# Patient Record
Sex: Female | Born: 1940
Health system: Southern US, Community
[De-identification: ages and names within clinical notes are randomized; demographics above are authoritative.]

## PROBLEM LIST (undated history)

## (undated) DIAGNOSIS — I10 Essential (primary) hypertension: Secondary | ICD-10-CM

## (undated) DIAGNOSIS — Z8719 Personal history of other diseases of the digestive system: Secondary | ICD-10-CM

## (undated) DIAGNOSIS — T7840XA Allergy, unspecified, initial encounter: Secondary | ICD-10-CM

## (undated) DIAGNOSIS — C801 Malignant (primary) neoplasm, unspecified: Secondary | ICD-10-CM

## (undated) DIAGNOSIS — M1990 Unspecified osteoarthritis, unspecified site: Secondary | ICD-10-CM

## (undated) DIAGNOSIS — R42 Dizziness and giddiness: Secondary | ICD-10-CM

## (undated) HISTORY — DX: Unspecified osteoarthritis, unspecified site: M19.90

## (undated) HISTORY — DX: Dizziness and giddiness: R42

## (undated) HISTORY — DX: Malignant (primary) neoplasm, unspecified: C80.1

## (undated) HISTORY — DX: Allergy, unspecified, initial encounter: T78.40XA

## (undated) HISTORY — PX: CHOLECYSTECTOMY: SHX55

## (undated) HISTORY — PX: EYE SURGERY: SHX253

## (undated) HISTORY — PX: BREAST LUMPECTOMY: SHX2

---

## 1999-11-22 ENCOUNTER — Encounter: Admission: RE | Admit: 1999-11-22 | Discharge: 2000-02-20 | Payer: Self-pay | Admitting: Radiation Oncology

## 2000-11-26 DIAGNOSIS — Z923 Personal history of irradiation: Secondary | ICD-10-CM

## 2000-11-26 HISTORY — DX: Personal history of irradiation: Z92.3

## 2008-05-11 ENCOUNTER — Ambulatory Visit: Payer: Self-pay | Admitting: Cardiology

## 2013-07-15 ENCOUNTER — Telehealth (INDEPENDENT_AMBULATORY_CARE_PROVIDER_SITE_OTHER): Payer: Self-pay

## 2013-07-15 NOTE — Telephone Encounter (Signed)
Called Misty Wilkerson to see if she would be bringing her mgm films to her appt with Dr Dwain Sarna on 8/26 and the Misty Wilkerson will take care of that for [place].

## 2013-07-21 ENCOUNTER — Ambulatory Visit (INDEPENDENT_AMBULATORY_CARE_PROVIDER_SITE_OTHER): Payer: Medicare Other | Admitting: General Surgery

## 2013-07-21 ENCOUNTER — Encounter (INDEPENDENT_AMBULATORY_CARE_PROVIDER_SITE_OTHER): Payer: Self-pay | Admitting: General Surgery

## 2013-07-21 VITALS — BP 110/70 | HR 64 | Resp 16 | Ht 62.0 in | Wt 158.8 lb

## 2013-07-21 DIAGNOSIS — C50219 Malignant neoplasm of upper-inner quadrant of unspecified female breast: Secondary | ICD-10-CM

## 2013-07-21 DIAGNOSIS — C50212 Malignant neoplasm of upper-inner quadrant of left female breast: Secondary | ICD-10-CM

## 2013-07-22 NOTE — Progress Notes (Signed)
Patient ID: Misty Wilkerson, female   DOB: 03/01/1941, 71 y.o.   MRN: [id number]  Chief Complaint  Patient presents with  . New Evaluation    new br ca    HPI Misty Wilkerson is a 71 y.o. female. Referred by Dr Tapper/ Dr Murray HPI 71 yof who underwent prior left breast lumpectomy/snbx folowed by xrt for what sounds like T1bN0M0 er/pr pos her2 negative tumor in 2000.  She tried both tamoxifen and arimidex but stopped both of these due to "anger issues".  This significantly degraded her quality of life so she did not complete them.  She has no complaints referable to either breast.  She recently underwent routine screening mm that showed a small irregular mass in the upper portion of the left breast posteriorly.  US showed an irregular round mass in 1030 position measuring 5 mm.  No abnormal nodes were noted in the axilla.  This underwent us guided biopsy with clip placement  The biopsy shows idc with dcis, er/pr positive and her2 not amplified.  Appears Ki is 11%.  This is grade I tumor.  She comes in today to discuss options.  Dr Murray, her radiation oncologist as well as her husbands, has discussed patient with me.  Past Medical History  Diagnosis Date  . Arthritis   . Cancer     Past Surgical History  Procedure Laterality Date  . Breast lumpectomy    . Cholecystectomy      History reviewed. No pertinent family history.  Social History History  Substance Use Topics  . Smoking status: Former Smoker    Quit date: 07/22/1999  . Smokeless tobacco: Never Used  . Alcohol Use: Yes    Allergies  Allergen Reactions  . Penicillins Shortness Of Breath and Rash    Current Outpatient Prescriptions  Medication Sig Dispense Refill  . loratadine (CLARITIN) 10 MG tablet Take 10 mg by mouth daily.      . meloxicam (MOBIC) 15 MG tablet       . metoprolol succinate (TOPROL-XL) 50 MG 24 hr tablet       . vitamin B-12 (CYANOCOBALAMIN) 1000 MCG tablet Take 1,000 mcg by mouth daily.         No current facility-administered medications for this visit.    Review of Systems Review of Systems  Constitutional: Negative for fever, chills and unexpected weight change.  HENT: Negative for hearing loss, congestion, sore throat, trouble swallowing and voice change.   Eyes: Negative for visual disturbance.  Respiratory: Negative for cough and wheezing.   Cardiovascular: Negative for chest pain, palpitations and leg swelling.  Gastrointestinal: Negative for nausea, vomiting, abdominal pain, diarrhea, constipation, blood in stool, abdominal distention and anal bleeding.  Genitourinary: Negative for hematuria, vaginal bleeding and difficulty urinating.  Musculoskeletal: Positive for arthralgias.  Skin: Negative for rash and wound.  Neurological: Negative for seizures, syncope and headaches.  Hematological: Negative for adenopathy. Bruises/bleeds easily.  Psychiatric/Behavioral: Negative for confusion.    Blood pressure 110/70, pulse 64, resp. rate 16, height 5' 2" (1.575 m), weight 158 lb 12.8 oz (72.031 kg).  Physical Exam Physical Exam  Vitals reviewed. Constitutional: She appears well-developed and well-nourished.  Eyes: No scleral icterus.  Neck: Neck supple.  Cardiovascular: Normal rate, regular rhythm and normal heart sounds.   Pulmonary/Chest: Effort normal and breath sounds normal. She has no wheezes. She has no rales. Right breast exhibits no inverted nipple, no mass, no nipple discharge, no skin change and no tenderness. Left breast exhibits no   inverted nipple, no mass, no nipple discharge, no skin change and no tenderness.    Abdominal: Soft. There is no hepatomegaly.  Lymphadenopathy:    She has no cervical adenopathy.    Data Reviewed Mm, path and old notes reviewed  Assessment   Left breast cancer (appears to be new site and not in lumpectomy bed), s/p bct/xrt previously    Plan    Left breast wire guided lumpectomy, attempt repeat left axillary sentinel  node biopsy     We discussed standard recommendation per the NCCN guidelines to be mastectomy given prior bct with xrt.  She does not really want to undergo mastectomy.  We discussed a lumpectomy but that she would not be able to receive xrt.  The tumor is biologically favorable and I think if able to do lumpectomy with negative margin followed by antiestrogen therapy (we discussed this at length given her prior issues) then this might be reasonable.  She very much is in favor of this.  We discussed breast mr and then decided to forgo it. I will also attempt a repeat snbx at time of surgery but she knows this technically might not be possible. We discussed the staging and pathophysiology of breast cancer. We discussed all of the different options for treatment for breast cancer including surgery, chemotherapy, radiation therapy, Herceptin, and antiestrogen therapy.   We discussed a sentinel lymph node biopsy as she does not appear to having lymph node involvement right now. We discussed the performance of that with injection of radioactive tracer and blue dye. We discussed that she would have an incision underneath her axillary hairline where prior one is. We discussed that there is a bout a 10% chance of having a positive node with a sentinel lymph node biopsy and we will await the permanent pathology to make any other first further decisions in terms of her treatment. One of these options might be to return to the operating room to perform an axillary lymph node dissection. We discussed about a 1-2% risk lifetime of chronic shoulder pain as well as lymphedema associated with a sentinel lymph node biopsy. This is likely a little higher given prior surgery  We discussed the performance of the lumpectomy with a wire placement. We discussed a 10% chance of a positive margin requiring reexcision in the operating room.  We discussed the risks of operation including bleeding, infection, possible reoperation. She  understands her further therapy will be based on what her stages at the time of her operation.    Misty Wilkerson 07/22/2013, 9:56 PM    

## 2013-07-23 ENCOUNTER — Telehealth (INDEPENDENT_AMBULATORY_CARE_PROVIDER_SITE_OTHER): Payer: Self-pay | Admitting: General Surgery

## 2013-07-23 NOTE — Telephone Encounter (Signed)
[facility] needs images for needle localization

## 2013-07-23 NOTE — Telephone Encounter (Signed)
Please make sure images for needle localization

## 2013-07-30 ENCOUNTER — Encounter (INDEPENDENT_AMBULATORY_CARE_PROVIDER_SITE_OTHER): Payer: Self-pay

## 2013-07-31 ENCOUNTER — Telehealth (INDEPENDENT_AMBULATORY_CARE_PROVIDER_SITE_OTHER): Payer: Self-pay

## 2013-07-31 NOTE — Telephone Encounter (Signed)
Called pt to check with her to see if she needed another appt to come back and see Dr Dwain Sarna before surgery on 08/12/13. The pt is totally fine with her surgery date and not needing another appt with Dr Dwain Sarna. The pt feels very comfortable with her decision moving forward. I advised pt if any questions to please call our office.

## 2013-08-06 ENCOUNTER — Encounter (HOSPITAL_COMMUNITY): Payer: Self-pay | Admitting: Pharmacy Technician

## 2013-08-06 NOTE — Pre-Procedure Instructions (Signed)
Misty Wilkerson  08/06/2013   Your procedure is scheduled on:  Wednesday, Sept. 17th   Report to [facility] at 8:00 AM.             You will come through Entrance "A".   Call this number if you have problems the morning of surgery: [phone]   Remember:   Do not eat food or drink liquids after midnight Tuesday .   Take these medicines the morning of surgery with A SIP OF WATER: Metoprolol, Claritin   Do not wear jewelry, make-up or nail polish.  Do not wear lotions, powders, or perfumes. You may NOT wear deodorant.  Do not shave underarms & legs 48 hours prior to surgery.    Do not bring valuables to the hospital.  [facility] is not responsible for any belongings or valuables.  Contacts, dentures or bridgework may not be worn into surgery.   Leave suitcase in the car. After surgery it may be brought to your room.  For patients admitted to the hospital, checkout time is 11:00 AM the day of discharge.   Name and phone number of your driver:    Special Instructions: Shower using CHG 2 nights before surgery and the night before surgery.  If you shower the day of surgery use CHG.  Use special wash - you have one bottle of CHG for all showers.  You should use approximately 1/3 of the bottle for each shower.   Please read over the following fact sheets that you were given: Pain Booklet, MRSA Information and Surgical Site Infection Prevention

## 2013-08-07 ENCOUNTER — Encounter (HOSPITAL_COMMUNITY)
Admission: RE | Admit: 2013-08-07 | Discharge: 2013-08-07 | Disposition: A | Discharge status: Home / Self Care | Payer: Medicare Other | Source: Ambulatory Visit | Attending: General Surgery | Admitting: General Surgery

## 2013-08-07 ENCOUNTER — Telehealth (INDEPENDENT_AMBULATORY_CARE_PROVIDER_SITE_OTHER): Payer: Self-pay

## 2013-08-07 ENCOUNTER — Encounter (HOSPITAL_COMMUNITY): Payer: Self-pay

## 2013-08-07 ENCOUNTER — Ambulatory Visit (HOSPITAL_COMMUNITY)
Admission: RE | Admit: 2013-08-07 | Discharge: 2013-08-07 | Disposition: A | Discharge status: Home / Self Care | Payer: Medicare Other | Source: Ambulatory Visit | Attending: Anesthesiology | Admitting: Anesthesiology

## 2013-08-07 DIAGNOSIS — Z01818 Encounter for other preprocedural examination: Secondary | ICD-10-CM | POA: Insufficient documentation

## 2013-08-07 DIAGNOSIS — Z01810 Encounter for preprocedural cardiovascular examination: Secondary | ICD-10-CM | POA: Insufficient documentation

## 2013-08-07 DIAGNOSIS — C50919 Malignant neoplasm of unspecified site of unspecified female breast: Secondary | ICD-10-CM | POA: Insufficient documentation

## 2013-08-07 DIAGNOSIS — Z01812 Encounter for preprocedural laboratory examination: Secondary | ICD-10-CM | POA: Insufficient documentation

## 2013-08-07 HISTORY — DX: Personal history of other diseases of the digestive system: Z87.19

## 2013-08-07 HISTORY — DX: Essential (primary) hypertension: I10

## 2013-08-07 LAB — BASIC METABOLIC PANEL
BUN: 26 mg/dL — ABNORMAL HIGH (ref 6–23)
Calcium: 9.9 mg/dL (ref 8.4–10.5)
Creatinine, Ser: 1.31 mg/dL — ABNORMAL HIGH (ref 0.50–1.10)
GFR calc Af Amer: 47 mL/min — ABNORMAL LOW (ref >90)
GFR calc non Af Amer: 40 mL/min — ABNORMAL LOW (ref >90)

## 2013-08-07 LAB — CBC WITH DIFFERENTIAL/PLATELET
Basophils Absolute: 0 10*3/uL (ref 0.0–0.1)
Basophils Relative: 0 % (ref 0–1)
Eosinophils Absolute: 0.3 10*3/uL (ref 0.0–0.7)
Hemoglobin: 13.9 g/dL (ref 12.0–15.0)
MCH: 32 pg (ref 26.0–34.0)
MCHC: 34.8 g/dL (ref 30.0–36.0)
Monocytes Relative: 8 % (ref 3–12)
Neutro Abs: 3.5 10*3/uL (ref 1.7–7.7)
Neutrophils Relative %: 60 % (ref 43–77)
Platelets: 196 10*3/uL (ref 150–400)
RDW: 12.7 % (ref 11.5–15.5)

## 2013-08-07 LAB — CANCER ANTIGEN 27.29: CA 27.29: 34 U/mL (ref 0–39)

## 2013-08-07 NOTE — Progress Notes (Addendum)
Requested stress test results from [facility] in [place]--in 2009.  She said that she had eated something "hot", started having 'chest discomfort'--results came back negative cardiac wise and that she has hiatal hernia.  DA BUN & Creat are elevated.  I called [facility] to give Dr. Dwain Sarna a 'heads up'.........DA

## 2013-08-07 NOTE — Telephone Encounter (Signed)
Calling to give heads up to review BUN and Creatnine. Pt scheduled for surgery on 08/12/13 left lumpectomy.

## 2013-08-10 NOTE — Telephone Encounter (Signed)
Reviewed. She is fine for surgery

## 2013-08-11 MED ORDER — VANCOMYCIN HCL IN DEXTROSE 1-5 GM/200ML-% IV SOLN
1000.0000 mg | INTRAVENOUS | Status: AC
  Administered 2013-08-12: 1000 mg via INTRAVENOUS
  Filled 2013-08-11: qty 200

## 2013-08-12 ENCOUNTER — Ambulatory Visit (HOSPITAL_COMMUNITY)
Admission: RE | Admit: 2013-08-12 | Discharge: 2013-08-12 | Disposition: A | Discharge status: Home / Self Care | Payer: Medicare Other | Source: Ambulatory Visit | Attending: General Surgery | Admitting: General Surgery

## 2013-08-12 ENCOUNTER — Other Ambulatory Visit (INDEPENDENT_AMBULATORY_CARE_PROVIDER_SITE_OTHER): Payer: Self-pay | Admitting: General Surgery

## 2013-08-12 ENCOUNTER — Encounter (HOSPITAL_COMMUNITY)
Admission: RE | Disposition: A | Discharge status: Home / Self Care | Payer: Self-pay | Source: Ambulatory Visit | Attending: General Surgery

## 2013-08-12 ENCOUNTER — Encounter (HOSPITAL_COMMUNITY): Payer: Self-pay | Admitting: Surgery

## 2013-08-12 ENCOUNTER — Ambulatory Visit
Admission: RE | Admit: 2013-08-12 | Discharge: 2013-08-12 | Disposition: A | Discharge status: Home / Self Care | Payer: Medicare Other | Source: Ambulatory Visit | Attending: General Surgery | Admitting: General Surgery

## 2013-08-12 ENCOUNTER — Encounter (HOSPITAL_COMMUNITY)
Admission: RE | Admit: 2013-08-12 | Discharge: 2013-08-12 | Disposition: A | Discharge status: Home / Self Care | Payer: Medicare Other | Source: Ambulatory Visit | Attending: General Surgery | Admitting: General Surgery

## 2013-08-12 ENCOUNTER — Ambulatory Visit (HOSPITAL_COMMUNITY): Payer: Medicare Other | Admitting: Anesthesiology

## 2013-08-12 ENCOUNTER — Encounter (HOSPITAL_COMMUNITY): Payer: Self-pay | Admitting: Anesthesiology

## 2013-08-12 DIAGNOSIS — I10 Essential (primary) hypertension: Secondary | ICD-10-CM | POA: Insufficient documentation

## 2013-08-12 DIAGNOSIS — D0590 Unspecified type of carcinoma in situ of unspecified breast: Secondary | ICD-10-CM | POA: Insufficient documentation

## 2013-08-12 DIAGNOSIS — C50919 Malignant neoplasm of unspecified site of unspecified female breast: Secondary | ICD-10-CM | POA: Insufficient documentation

## 2013-08-12 DIAGNOSIS — C50212 Malignant neoplasm of upper-inner quadrant of left female breast: Secondary | ICD-10-CM

## 2013-08-12 HISTORY — PX: PARTIAL MASTECTOMY WITH NEEDLE LOCALIZATION AND AXILLARY SENTINEL LYMPH NODE BX: SHX6009

## 2013-08-12 SURGERY — PARTIAL MASTECTOMY WITH NEEDLE LOCALIZATION AND AXILLARY SENTINEL LYMPH NODE BX
Anesthesia: General | Site: Breast | Laterality: Left | Wound class: Clean

## 2013-08-12 MED ORDER — HYDROMORPHONE HCL PF 1 MG/ML IJ SOLN
INTRAMUSCULAR | Status: AC
  Filled 2013-08-12: qty 1

## 2013-08-12 MED ORDER — MIDAZOLAM HCL 5 MG/5ML IJ SOLN
INTRAMUSCULAR | Status: DC | PRN
  Administered 2013-08-12: 2 mg via INTRAVENOUS

## 2013-08-12 MED ORDER — BUPIVACAINE-EPINEPHRINE 0.25% -1:200000 IJ SOLN
INTRAMUSCULAR | Status: DC | PRN
  Administered 2013-08-12: 15 mL

## 2013-08-12 MED ORDER — BUPIVACAINE-EPINEPHRINE PF 0.25-1:200000 % IJ SOLN
INTRAMUSCULAR | Status: AC
  Filled 2013-08-12: qty 30

## 2013-08-12 MED ORDER — LIDOCAINE HCL (CARDIAC) 20 MG/ML IV SOLN
INTRAVENOUS | Status: DC | PRN
  Administered 2013-08-12: 90 mg via INTRAVENOUS

## 2013-08-12 MED ORDER — HYDROCODONE-ACETAMINOPHEN 10-325 MG PO TABS: 1.0000 | ORAL_TABLET | Freq: Four times a day (QID) | ORAL | Status: DC | PRN

## 2013-08-12 MED ORDER — MIDAZOLAM HCL 2 MG/2ML IJ SOLN
INTRAMUSCULAR | Status: AC
  Filled 2013-08-12: qty 2

## 2013-08-12 MED ORDER — FENTANYL CITRATE 0.05 MG/ML IJ SOLN: 50.0000 ug | Freq: Once | INTRAMUSCULAR | Status: DC

## 2013-08-12 MED ORDER — LACTATED RINGERS IV SOLN
INTRAVENOUS | Status: DC
  Administered 2013-08-12: 10:00:00 via INTRAVENOUS

## 2013-08-12 MED ORDER — SODIUM CHLORIDE 0.9 % IJ SOLN
INTRAMUSCULAR | Status: DC | PRN
  Administered 2013-08-12: 11:00:00

## 2013-08-12 MED ORDER — TECHNETIUM TC 99M SULFUR COLLOID FILTERED
1.0000 | Freq: Once | INTRAVENOUS | Status: AC | PRN
  Administered 2013-08-12: 1 via INTRADERMAL

## 2013-08-12 MED ORDER — MIDAZOLAM HCL 2 MG/2ML IJ SOLN: 1.0000 mg | INTRAMUSCULAR | Status: DC | PRN

## 2013-08-12 MED ORDER — FENTANYL CITRATE 0.05 MG/ML IJ SOLN
INTRAMUSCULAR | Status: DC | PRN
  Administered 2013-08-12 (×2): 50 ug via INTRAVENOUS

## 2013-08-12 MED ORDER — PROPOFOL 10 MG/ML IV BOLUS
INTRAVENOUS | Status: DC | PRN
  Administered 2013-08-12: 160 mg via INTRAVENOUS

## 2013-08-12 MED ORDER — METHYLENE BLUE 1 % INJ SOLN
INTRAMUSCULAR | Status: AC
  Filled 2013-08-12: qty 10

## 2013-08-12 MED ORDER — OXYCODONE HCL 5 MG PO TABS
ORAL_TABLET | ORAL | Status: AC
  Filled 2013-08-12: qty 1

## 2013-08-12 MED ORDER — 0.9 % SODIUM CHLORIDE (POUR BTL) OPTIME
TOPICAL | Status: DC | PRN
  Administered 2013-08-12: 1000 mL

## 2013-08-12 MED ORDER — ONDANSETRON HCL 4 MG/2ML IJ SOLN
INTRAMUSCULAR | Status: DC | PRN
  Administered 2013-08-12: 4 mg via INTRAVENOUS

## 2013-08-12 MED ORDER — PROMETHAZINE HCL 25 MG/ML IJ SOLN: 6.2500 mg | INTRAMUSCULAR | Status: DC | PRN

## 2013-08-12 MED ORDER — OXYCODONE HCL 5 MG PO TABS
5.0000 mg | ORAL_TABLET | Freq: Once | ORAL | Status: AC | PRN
  Administered 2013-08-12: 5 mg via ORAL

## 2013-08-12 MED ORDER — FENTANYL CITRATE 0.05 MG/ML IJ SOLN
INTRAMUSCULAR | Status: AC
  Filled 2013-08-12: qty 2

## 2013-08-12 MED ORDER — OXYCODONE HCL 5 MG/5ML PO SOLN: 5.0000 mg | Freq: Once | ORAL | Status: AC | PRN

## 2013-08-12 MED ORDER — EPHEDRINE SULFATE 50 MG/ML IJ SOLN
INTRAMUSCULAR | Status: DC | PRN
  Administered 2013-08-12 (×4): 10 mg via INTRAVENOUS

## 2013-08-12 MED ORDER — HYDROMORPHONE HCL PF 1 MG/ML IJ SOLN
0.2500 mg | INTRAMUSCULAR | Status: DC | PRN
  Administered 2013-08-12 (×3): 0.5 mg via INTRAVENOUS

## 2013-08-12 SURGICAL SUPPLY — 59 items
ADH SKN CLS APL DERMABOND .7 (GAUZE/BANDAGES/DRESSINGS) ×1
APL SKNCLS STERI-STRIP NONHPOA (GAUZE/BANDAGES/DRESSINGS) ×1
APPLIER CLIP 9.375 MED OPEN (MISCELLANEOUS) ×2
APR CLP MED 9.3 20 MLT OPN (MISCELLANEOUS) ×1
BENZOIN TINCTURE PRP APPL 2/3 (GAUZE/BANDAGES/DRESSINGS) ×1 IMPLANT
BINDER BREAST LRG (GAUZE/BANDAGES/DRESSINGS) IMPLANT
BINDER BREAST XLRG (GAUZE/BANDAGES/DRESSINGS) ×1 IMPLANT
BLADE SURG 10 STRL SS (BLADE) ×2 IMPLANT
BLADE SURG 15 STRL LF DISP TIS (BLADE) ×1 IMPLANT
BLADE SURG 15 STRL SS (BLADE) ×2
CANISTER SUCTION 2500CC (MISCELLANEOUS) ×2 IMPLANT
CHLORAPREP W/TINT 26ML (MISCELLANEOUS) ×2 IMPLANT
CLIP APPLIE 9.375 MED OPEN (MISCELLANEOUS) ×1 IMPLANT
CLOTH BEACON ORANGE TIMEOUT ST (SAFETY) ×2 IMPLANT
CONT SPEC STER OR (MISCELLANEOUS) ×2 IMPLANT
COVER PROBE W GEL 5X96 (DRAPES) ×2 IMPLANT
COVER SURGICAL LIGHT HANDLE (MISCELLANEOUS) ×2 IMPLANT
DERMABOND ADVANCED (GAUZE/BANDAGES/DRESSINGS) ×1
DERMABOND ADVANCED .7 DNX12 (GAUZE/BANDAGES/DRESSINGS) IMPLANT
DEVICE DUBIN SPECIMEN MAMMOGRA (MISCELLANEOUS) ×2 IMPLANT
DRAPE CHEST BREAST 15X10 FENES (DRAPES) ×2 IMPLANT
DRSG PAD ABDOMINAL 8X10 ST (GAUZE/BANDAGES/DRESSINGS) IMPLANT
DRSG TEGADERM 4X4.75 (GAUZE/BANDAGES/DRESSINGS) ×1 IMPLANT
ELECT CAUTERY BLADE 6.4 (BLADE) ×2 IMPLANT
ELECT REM PT RETURN 9FT ADLT (ELECTROSURGICAL) ×2
ELECTRODE REM PT RTRN 9FT ADLT (ELECTROSURGICAL) ×1 IMPLANT
GAUZE SPONGE 2X2 8PLY STRL LF (GAUZE/BANDAGES/DRESSINGS) IMPLANT
GLOVE BIO SURGEON STRL SZ7 (GLOVE) ×2 IMPLANT
GLOVE BIO SURGEON STRL SZ7.5 (GLOVE) ×1 IMPLANT
GLOVE BIOGEL PI IND STRL 7.5 (GLOVE) ×1 IMPLANT
GLOVE BIOGEL PI INDICATOR 7.5 (GLOVE) ×2
GOWN STRL NON-REIN LRG LVL3 (GOWN DISPOSABLE) ×4 IMPLANT
KIT BASIN OR (CUSTOM PROCEDURE TRAY) ×2 IMPLANT
KIT MARKER MARGIN INK (KITS) ×1 IMPLANT
KIT ROOM TURNOVER OR (KITS) ×2 IMPLANT
NDL 18GX1X1/2 (RX/OR ONLY) (NEEDLE) IMPLANT
NDL HYPO 25GX1X1/2 BEV (NEEDLE) ×1 IMPLANT
NEEDLE 18GX1X1/2 (RX/OR ONLY) (NEEDLE) IMPLANT
NEEDLE HYPO 25GX1X1/2 BEV (NEEDLE) ×2 IMPLANT
NS IRRIG 1000ML POUR BTL (IV SOLUTION) ×2 IMPLANT
PACK SURGICAL SETUP 50X90 (CUSTOM PROCEDURE TRAY) ×2 IMPLANT
PAD ARMBOARD 7.5X6 YLW CONV (MISCELLANEOUS) ×2 IMPLANT
PENCIL BUTTON HOLSTER BLD 10FT (ELECTRODE) ×2 IMPLANT
SPONGE GAUZE 2X2 STER 10/PKG (GAUZE/BANDAGES/DRESSINGS) ×1
SPONGE GAUZE 4X4 12PLY (GAUZE/BANDAGES/DRESSINGS) IMPLANT
SPONGE LAP 18X18 X RAY DECT (DISPOSABLE) ×2 IMPLANT
STAPLER VISISTAT 35W (STAPLE) ×2 IMPLANT
STRIP CLOSURE SKIN 1/2X4 (GAUZE/BANDAGES/DRESSINGS) ×1 IMPLANT
SUT MNCRL AB 4-0 PS2 18 (SUTURE) ×4 IMPLANT
SUT SILK 2 0 SH (SUTURE) IMPLANT
SUT VIC AB 2-0 SH 27 (SUTURE) ×4
SUT VIC AB 2-0 SH 27XBRD (SUTURE) ×2 IMPLANT
SUT VIC AB 3-0 SH 27 (SUTURE) ×4
SUT VIC AB 3-0 SH 27XBRD (SUTURE) ×2 IMPLANT
SYR CONTROL 10ML LL (SYRINGE) ×2 IMPLANT
TOWEL OR 17X24 6PK STRL BLUE (TOWEL DISPOSABLE) ×2 IMPLANT
TOWEL OR 17X26 10 PK STRL BLUE (TOWEL DISPOSABLE) ×2 IMPLANT
TUBE CONNECTING 12X1/4 (SUCTIONS) ×2 IMPLANT
YANKAUER SUCT BULB TIP NO VENT (SUCTIONS) ×2 IMPLANT

## 2013-08-12 NOTE — Transfer of Care (Signed)
Immediate Anesthesia Transfer of Care Note  Patient: Misty Wilkerson  Procedure(s) Performed: Procedure(s): LEFT PARTIAL MASTECTOMY WITH NEEDLE LOCALIZATION ATTEMPTED AXILLARY SENTINEL LYMPH NODE BIOPSY (Left)  Patient Location: PACU  Anesthesia Type:General  Level of Consciousness: awake, alert , oriented and patient cooperative  Airway & Oxygen Therapy: Patient Spontanous Breathing  Post-op Assessment: Report given to PACU RN, Post -op Vital signs reviewed and stable and Patient moving all extremities X 4  Post vital signs: Reviewed and stable  Complications: No apparent anesthesia complications

## 2013-08-12 NOTE — Anesthesia Preprocedure Evaluation (Signed)
Anesthesia Evaluation  Patient identified by MRN, date of birth, ID band Patient awake    Reviewed: Allergy & Precautions, H&P , NPO status , Patient's Chart, lab work & pertinent test results  Airway Mallampati: I TM Distance: >3 FB Neck ROM: Full    Dental   Pulmonary  breath sounds clear to auscultation        Cardiovascular hypertension, Rhythm:Regular Rate:Normal     Neuro/Psych    GI/Hepatic hiatal hernia,   Endo/Other    Renal/GU      Musculoskeletal   Abdominal   Peds  Hematology   Anesthesia Other Findings   Reproductive/Obstetrics                           Anesthesia Physical Anesthesia Plan  ASA: II  Anesthesia Plan: General   Post-op Pain Management:    Induction: Intravenous  Airway Management Planned: LMA  Additional Equipment:   Intra-op Plan:   Post-operative Plan: Extubation in OR  Informed Consent: I have reviewed the patients History and Physical, chart, labs and discussed the procedure including the risks, benefits and alternatives for the proposed anesthesia with the patient or authorized representative who has indicated his/her understanding and acceptance.     Plan Discussed with: CRNA and Surgeon  Anesthesia Plan Comments:         Anesthesia Quick Evaluation

## 2013-08-12 NOTE — Preoperative (Signed)
Beta Blockers   Reason not to administer Beta Blockers:Not Applicable 

## 2013-08-12 NOTE — Anesthesia Postprocedure Evaluation (Signed)
  Anesthesia Post-op Note  Patient: Misty Wilkerson  Procedure(s) Performed: Procedure(s): LEFT PARTIAL MASTECTOMY WITH NEEDLE LOCALIZATION ATTEMPTED AXILLARY SENTINEL LYMPH NODE BIOPSY (Left)  Patient Location: PACU  Anesthesia Type:General  Level of Consciousness: awake  Airway and Oxygen Therapy: Patient Spontanous Breathing  Post-op Pain: mild  Post-op Assessment: Post-op Vital signs reviewed, Patient's Cardiovascular Status Stable, Respiratory Function Stable, Patent Airway, No signs of Nausea or vomiting and Pain level controlled  Post-op Vital Signs: Reviewed and stable  Complications: No apparent anesthesia complications

## 2013-08-12 NOTE — Op Note (Signed)
Preoperative diagnosis: Clinical stage I left breast cancer Postoperative diagnosis: Same as above Procedure: #1 left breast wire-guided lumpectomy #2 attempted left axillary sentinel node biopsy #3 injection of blue dye for attempted left axillary sentinel node biopsy Surgeon: Dr. Harden Mo Anesthesia: Gen. Estimated blood loss: Minimal Specimens: #1 left breast tissue marked with paint Drains: None Complications: None Sponge and needle count correct at completion Disposition to recovery stable  Indications: This is a 72 year old female who has a prior history of a left breast cancer treated with breast conservation therapy including radiotherapy. She presented with what appeared to be a new breast cancer on this side that is clinical stage I and hormone receptor positive. We discussed all of the recommendations that are standard including a mastectomy. She very much did not want to undergo mastectomy and would like to try to do a lumpectomy and agreed to attempt the anti-estrogen therapy postoperatively. She understands the risks of this. We decided to proceed with a lumpectomy and an attempt at another  sentinel node biopsy.  Procedure: After informed consent was obtained the patient was taken to the operating room. She first had 2 wires placed by Dr. Manson Passey. I had these mammograms for my review. She was administered technetium in a standard periareolar fashion. She was then placed under general anesthesia without complication. She had sequential compression devices on her legs. Her left breast and axilla were then prepped and draped in the standard sterile surgical fashion. A surgical timeout was then performed.  I then injected methylene blue and saline around the areola. I massaged this for 2 minutes. I then made a curvilinear incision overlying the wire and the lesions. I dissected down and brought the wires in from their remote position. I then used cautery to remove the wires and the  surrounding tissue with an attempt to get a clear margin. This was done all the way down to the pectoralis muscle including the fascia. This was then marked with paint. A Faxitron mammogram confirmed removal of both wires in the clip. I then obtained hemostasis. I then closed this with 2-0 Vicryl, 3-0 Vicryl, 4 Monocryl, and Dermabond.  I then attempted to localize a sentinel node. There was a very faint radioactivity in the axilla so I decided to reenter her old incision. I  went back through the axillary fascia. I was really unable to identify any blue dye or any real radioactivity in the axilla. There is no evidence of any nodal enlargement either. I decided this time to abort the sentinel lymph node as I was not able to identify it. I then closed this with 2-0 Vicryl. I closed the skin with 3-0 Vicryl 4 Monocryl. I used Steri-Strips and a sterile dressing. She tolerated this well. She was placed in a breast binder. She was then transferred to recovery in stable condition.

## 2013-08-12 NOTE — H&P (View-Only) (Signed)
Patient ID: Misty Wilkerson, female   DOB: September 09, 1941, 72 y.o.   MRN: [id number]  Chief Complaint  Patient presents with  . New Evaluation    new br ca    HPI Misty Wilkerson is a 72 y.o. female. Referred by Dr Tapper/ Dr Dayton Scrape HPI 68 yof who underwent prior left breast lumpectomy/snbx folowed by xrt for what sounds like T1bN0M0 er/pr pos her2 negative tumor in 2000.  She tried both tamoxifen and arimidex but stopped both of these due to "anger issues".  This significantly degraded her quality of life so she did not complete them.  She has no complaints referable to either breast.  She recently underwent routine screening mm that showed a small irregular mass in the upper portion of the left breast posteriorly.  US showed an irregular round mass in 1030 position measuring 5 mm.  No abnormal nodes were noted in the axilla.  This underwent US guided biopsy with clip placement  The biopsy shows idc with dcis, er/pr positive and her2 not amplified.  Appears Ki is 11%.  This is grade I tumor.  She comes in today to discuss options.  Dr Dayton Scrape, her radiation oncologist as well as her husbands, has discussed patient with me.  Past Medical History  Diagnosis Date  . Arthritis   . Cancer     Past Surgical History  Procedure Laterality Date  . Breast lumpectomy    . Cholecystectomy      History reviewed. No pertinent family history.  Social History History  Substance Use Topics  . Smoking status: Former Smoker    Quit date: 07/22/1999  . Smokeless tobacco: Never Used  . Alcohol Use: Yes    Allergies  Allergen Reactions  . Penicillins Shortness Of Breath and Rash    Current Outpatient Prescriptions  Medication Sig Dispense Refill  . loratadine (CLARITIN) 10 MG tablet Take 10 mg by mouth daily.      . meloxicam (MOBIC) 15 MG tablet       . metoprolol succinate (TOPROL-XL) 50 MG 24 hr tablet       . vitamin B-12 (CYANOCOBALAMIN) 1000 MCG tablet Take 1,000 mcg by mouth daily.         No current facility-administered medications for this visit.    Review of Systems Review of Systems  Constitutional: Negative for fever, chills and unexpected weight change.  HENT: Negative for hearing loss, congestion, sore throat, trouble swallowing and voice change.   Eyes: Negative for visual disturbance.  Respiratory: Negative for cough and wheezing.   Cardiovascular: Negative for chest pain, palpitations and leg swelling.  Gastrointestinal: Negative for nausea, vomiting, abdominal pain, diarrhea, constipation, blood in stool, abdominal distention and anal bleeding.  Genitourinary: Negative for hematuria, vaginal bleeding and difficulty urinating.  Musculoskeletal: Positive for arthralgias.  Skin: Negative for rash and wound.  Neurological: Negative for seizures, syncope and headaches.  Hematological: Negative for adenopathy. Bruises/bleeds easily.  Psychiatric/Behavioral: Negative for confusion.    Blood pressure 110/70, pulse 64, resp. rate 16, height 5\' 2"  (1.575 m), weight 158 lb 12.8 oz (72.031 kg).  Physical Exam Physical Exam  Vitals reviewed. Constitutional: She appears well-developed and well-nourished.  Eyes: No scleral icterus.  Neck: Neck supple.  Cardiovascular: Normal rate, regular rhythm and normal heart sounds.   Pulmonary/Chest: Effort normal and breath sounds normal. She has no wheezes. She has no rales. Right breast exhibits no inverted nipple, no mass, no nipple discharge, no skin change and no tenderness. Left breast exhibits no  inverted nipple, no mass, no nipple discharge, no skin change and no tenderness.    Abdominal: Soft. There is no hepatomegaly.  Lymphadenopathy:    She has no cervical adenopathy.    Data Reviewed Mm, path and old notes reviewed  Assessment   Left breast cancer (appears to be new site and not in lumpectomy bed), s/p bct/xrt previously    Plan    Left breast wire guided lumpectomy, attempt repeat left axillary sentinel  node biopsy     We discussed standard recommendation per the NCCN guidelines to be mastectomy given prior bct with xrt.  She does not really want to undergo mastectomy.  We discussed a lumpectomy but that she would not be able to receive xrt.  The tumor is biologically favorable and I think if able to do lumpectomy with negative margin followed by antiestrogen therapy (we discussed this at length given her prior issues) then this might be reasonable.  She very much is in favor of this.  We discussed breast mr and then decided to forgo it. I will also attempt a repeat snbx at time of surgery but she knows this technically might not be possible. We discussed the staging and pathophysiology of breast cancer. We discussed all of the different options for treatment for breast cancer including surgery, chemotherapy, radiation therapy, Herceptin, and antiestrogen therapy.   We discussed a sentinel lymph node biopsy as she does not appear to having lymph node involvement right now. We discussed the performance of that with injection of radioactive tracer and blue dye. We discussed that she would have an incision underneath her axillary hairline where prior one is. We discussed that there is a bout a 10% chance of having a positive node with a sentinel lymph node biopsy and we will await the permanent pathology to make any other first further decisions in terms of her treatment. One of these options might be to return to the operating room to perform an axillary lymph node dissection. We discussed about a 1-2% risk lifetime of chronic shoulder pain as well as lymphedema associated with a sentinel lymph node biopsy. This is likely a little higher given prior surgery  We discussed the performance of the lumpectomy with a wire placement. We discussed a 10% chance of a positive margin requiring reexcision in the operating room.  We discussed the risks of operation including bleeding, infection, possible reoperation. She  understands her further therapy will be based on what her stages at the time of her operation.    Misty Wilkerson 07/22/2013, 9:56 PM

## 2013-08-12 NOTE — Interval H&P Note (Signed)
History and Physical Interval Note:  08/12/2013 9:56 AM  Misty Wilkerson  has presented today for surgery, with the diagnosis of left breast cancer   The various methods of treatment have been discussed with the patient and family. After consideration of risks, benefits and other options for treatment, the patient has consented to  Procedure(s) with comments: PARTIAL MASTECTOMY WITH NEEDLE LOCALIZATION AND AXILLARY SENTINEL LYMPH NODE BX (Left) - needle local BCG 7:30 nuc med 9:30  as a surgical intervention .  The patient's history has been reviewed, patient examined, no change in status, stable for surgery.  I have reviewed the patient's chart and labs.  Questions were answered to the patient's satisfaction.     Christin Moline

## 2013-08-12 NOTE — Anesthesia Procedure Notes (Signed)
Procedure Name: LMA Insertion Date/Time: 08/12/2013 10:24 AM Performed by: Sharlene Dory E Pre-anesthesia Checklist: Patient identified, Emergency Drugs available, Suction available, Patient being monitored and Timeout performed Patient Re-evaluated:Patient Re-evaluated prior to inductionOxygen Delivery Method: Circle system utilized Preoxygenation: Pre-oxygenation with 100% oxygen Intubation Type: IV induction Ventilation: Mask ventilation without difficulty LMA: LMA inserted LMA Size: 4.0 Number of attempts: 1 Placement Confirmation: positive ETCO2 and breath sounds checked- equal and bilateral Tube secured with: Tape Dental Injury: Teeth and Oropharynx as per pre-operative assessment  Comments: #4 LMA inserted per Denna Haggard, paramedic student, with Dr. Gypsy Balsam supervising.

## 2013-08-13 ENCOUNTER — Encounter (HOSPITAL_COMMUNITY): Payer: Self-pay | Admitting: General Surgery

## 2013-08-14 ENCOUNTER — Other Ambulatory Visit (INDEPENDENT_AMBULATORY_CARE_PROVIDER_SITE_OTHER): Payer: Self-pay | Admitting: General Surgery

## 2013-08-14 ENCOUNTER — Telehealth (INDEPENDENT_AMBULATORY_CARE_PROVIDER_SITE_OTHER): Payer: Self-pay | Admitting: General Surgery

## 2013-08-14 ENCOUNTER — Telehealth (INDEPENDENT_AMBULATORY_CARE_PROVIDER_SITE_OTHER): Payer: Self-pay

## 2013-08-14 NOTE — Telephone Encounter (Signed)
Returned pt's call. The pt has decided to go with the re-excision on the breast cancer. The pt wants surgery scheduled ASAP. The pt will come to see  Dr Dwain Sarna on 08/18/13 at 8:15 but they want surgical orders turned into scheduling ASAP to go ahead and work on getting another surgery date. I will notify Dr Dwain Sarna.

## 2013-08-14 NOTE — Telephone Encounter (Signed)
Pt's husband called for Dr. Dwain Sarna or his assistant, saying that Dr. Dwain Sarna had just called them with the pathology report.  He offered them options and they would like to proceed with surgery, via the existing incision, and as soon as possible.  Noted appt in office set for 08/24/13 and no available time sooner.  They are anxious for the surgery to be scheduled soonest.

## 2013-08-18 ENCOUNTER — Encounter (INDEPENDENT_AMBULATORY_CARE_PROVIDER_SITE_OTHER): Payer: Medicare Other | Admitting: General Surgery

## 2013-08-19 ENCOUNTER — Encounter (INDEPENDENT_AMBULATORY_CARE_PROVIDER_SITE_OTHER): Payer: Self-pay | Admitting: General Surgery

## 2013-08-19 ENCOUNTER — Other Ambulatory Visit (HOSPITAL_COMMUNITY): Payer: Self-pay | Admitting: *Deleted

## 2013-08-19 ENCOUNTER — Encounter (HOSPITAL_COMMUNITY): Payer: Self-pay | Admitting: *Deleted

## 2013-08-19 ENCOUNTER — Ambulatory Visit (INDEPENDENT_AMBULATORY_CARE_PROVIDER_SITE_OTHER): Payer: Medicare Other | Admitting: General Surgery

## 2013-08-19 ENCOUNTER — Telehealth (INDEPENDENT_AMBULATORY_CARE_PROVIDER_SITE_OTHER): Payer: Self-pay

## 2013-08-19 VITALS — BP 134/78 | HR 80 | Resp 18 | Ht 62.0 in | Wt 160.0 lb

## 2013-08-19 DIAGNOSIS — Z09 Encounter for follow-up examination after completed treatment for conditions other than malignant neoplasm: Secondary | ICD-10-CM

## 2013-08-19 NOTE — Progress Notes (Signed)
Subjective:     Patient ID: BRAYLA PAT, female   DOB: 11-06-1941, 72 y.o.   MRN: [id number]  HPI 72 year old female who has a prior left breast cancer treated with lumpectomy and radiation therapy. She presented with a new left breast cancer. This is hormone receptor positive. We discussed all of her options I told her that the standard of care was to proceed with a mastectomy. She very much wanted to attempt breast conservation therapy and then antiestrogen therapy. I took her to the operating room and end up excising 2 abnormal lesions. 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 This initially was hormone receptor positive. The margin was close posteriorly with this but this is the pectoralis muscle and there is no more tissue to remove in this position. She has a second tumor that is an invasive ductal carcinoma with no prognostic panel done at this point. I'm going to ask prognostic panel get down on this now. This has a positive superior margin. She reports no real complaints after her surgery and comes in for followup today.  Review of Systems     Objective:   Physical Exam Healing left breast incision without infection, healing left axillary incision without infection    Assessment:     Left breast cancer     Plan:     We discussed her pathology results today. I was unable to do a sentinel lymph node biopsy due to the fact that I could not identify a sentinel lymph node secondary to her prior surgery. I still think this is likely stage I disease. I again discussed the options which include proceeding with a mastectomy which would be considered the standard of care for her. We also discussed re excising the margin. I told her that certainly she would need to be on antiestrogen therapy. I also told her that I do think that we should await the prognostic panel to ensure that this does have the same characteristics as the  other. We are going to get this prognostic panel back. I will schedule her for reexcision of the superior margin. We discussed the possibility of continued further positive margins and the fact that she may very well end up needing a mastectomy. She and her husband both understand these issues and will plan on proceeding I will call them with her prognostic panel.

## 2013-08-19 NOTE — Telephone Encounter (Signed)
Called to request pathologist doing ER/PR on the second tumor on the pathology report of 08/12/13 per Dr Dwain Sarna.

## 2013-08-21 ENCOUNTER — Telehealth (INDEPENDENT_AMBULATORY_CARE_PROVIDER_SITE_OTHER): Payer: Self-pay

## 2013-08-21 NOTE — Telephone Encounter (Signed)
Called pt to notify her that we did receive the ER/PR on the pathology for tumor #2. The ER/PR is positive so we will go ahead with the final plan per Dr Dwain Sarna. The pt is very happy with keeping the same plan and is ready to go ahead to the next step.

## 2013-08-24 ENCOUNTER — Encounter (INDEPENDENT_AMBULATORY_CARE_PROVIDER_SITE_OTHER): Payer: Medicare Other | Admitting: General Surgery

## 2013-08-27 ENCOUNTER — Encounter (HOSPITAL_COMMUNITY): Payer: Self-pay | Admitting: Anesthesiology

## 2013-08-27 ENCOUNTER — Ambulatory Visit (HOSPITAL_COMMUNITY): Payer: Medicare Other | Admitting: Anesthesiology

## 2013-08-27 ENCOUNTER — Encounter (HOSPITAL_COMMUNITY)
Admission: RE | Disposition: A | Discharge status: Home / Self Care | Payer: Self-pay | Source: Ambulatory Visit | Attending: General Surgery

## 2013-08-27 ENCOUNTER — Ambulatory Visit (HOSPITAL_COMMUNITY)
Admission: RE | Admit: 2013-08-27 | Discharge: 2013-08-27 | Disposition: A | Discharge status: Home / Self Care | Payer: Medicare Other | Source: Ambulatory Visit | Attending: General Surgery | Admitting: General Surgery

## 2013-08-27 ENCOUNTER — Encounter (HOSPITAL_COMMUNITY): Payer: Self-pay | Admitting: *Deleted

## 2013-08-27 DIAGNOSIS — Z853 Personal history of malignant neoplasm of breast: Secondary | ICD-10-CM

## 2013-08-27 DIAGNOSIS — M129 Arthropathy, unspecified: Secondary | ICD-10-CM | POA: Insufficient documentation

## 2013-08-27 DIAGNOSIS — D0590 Unspecified type of carcinoma in situ of unspecified breast: Secondary | ICD-10-CM

## 2013-08-27 DIAGNOSIS — Z79899 Other long term (current) drug therapy: Secondary | ICD-10-CM | POA: Insufficient documentation

## 2013-08-27 DIAGNOSIS — C50919 Malignant neoplasm of unspecified site of unspecified female breast: Secondary | ICD-10-CM | POA: Insufficient documentation

## 2013-08-27 HISTORY — PX: RE-EXCISION OF BREAST LUMPECTOMY: SHX6048

## 2013-08-27 SURGERY — EXCISION, LESION, BREAST
Anesthesia: General | Site: Breast | Laterality: Left | Wound class: Clean

## 2013-08-27 MED ORDER — BUPIVACAINE-EPINEPHRINE PF 0.25-1:200000 % IJ SOLN
INTRAMUSCULAR | Status: AC
  Filled 2013-08-27: qty 30

## 2013-08-27 MED ORDER — MEPERIDINE HCL 50 MG/ML IJ SOLN: 6.2500 mg | INTRAMUSCULAR | Status: DC | PRN

## 2013-08-27 MED ORDER — SODIUM CHLORIDE 0.9 % IJ SOLN: 3.0000 mL | Freq: Two times a day (BID) | INTRAMUSCULAR | Status: DC

## 2013-08-27 MED ORDER — BUPIVACAINE HCL (PF) 0.25 % IJ SOLN
INTRAMUSCULAR | Status: DC | PRN
  Administered 2013-08-27: 20 mL

## 2013-08-27 MED ORDER — PROPOFOL 10 MG/ML IV BOLUS
INTRAVENOUS | Status: DC | PRN
  Administered 2013-08-27: 150 mg via INTRAVENOUS

## 2013-08-27 MED ORDER — 0.9 % SODIUM CHLORIDE (POUR BTL) OPTIME
TOPICAL | Status: DC | PRN
  Administered 2013-08-27: 1000 mL

## 2013-08-27 MED ORDER — ONDANSETRON HCL 4 MG/2ML IJ SOLN
INTRAMUSCULAR | Status: DC | PRN
  Administered 2013-08-27: 4 mg via INTRAVENOUS

## 2013-08-27 MED ORDER — KETAMINE HCL 50 MG/ML IJ SOLN
INTRAMUSCULAR | Status: DC | PRN
  Administered 2013-08-27: 25 mg via INTRAMUSCULAR

## 2013-08-27 MED ORDER — PROMETHAZINE HCL 25 MG/ML IJ SOLN: 6.2500 mg | INTRAMUSCULAR | Status: DC | PRN

## 2013-08-27 MED ORDER — OXYCODONE HCL 5 MG PO TABS: 5.0000 mg | ORAL_TABLET | ORAL | Status: DC | PRN

## 2013-08-27 MED ORDER — ACETAMINOPHEN 325 MG PO TABS: 650.0000 mg | ORAL_TABLET | ORAL | Status: DC | PRN

## 2013-08-27 MED ORDER — SODIUM CHLORIDE 0.9 % IV SOLN: 250.0000 mL | INTRAVENOUS | Status: DC | PRN

## 2013-08-27 MED ORDER — DEXAMETHASONE SODIUM PHOSPHATE 10 MG/ML IJ SOLN
INTRAMUSCULAR | Status: DC | PRN
  Administered 2013-08-27: 10 mg via INTRAVENOUS

## 2013-08-27 MED ORDER — VANCOMYCIN HCL IN DEXTROSE 1-5 GM/200ML-% IV SOLN
1000.0000 mg | INTRAVENOUS | Status: AC
  Administered 2013-08-27: 1000 mg via INTRAVENOUS

## 2013-08-27 MED ORDER — HYDROCODONE-ACETAMINOPHEN 10-325 MG PO TABS: 1.0000 | ORAL_TABLET | Freq: Four times a day (QID) | ORAL | Status: DC | PRN

## 2013-08-27 MED ORDER — ONDANSETRON HCL 4 MG/2ML IJ SOLN: 4.0000 mg | Freq: Four times a day (QID) | INTRAMUSCULAR | Status: DC | PRN

## 2013-08-27 MED ORDER — LACTATED RINGERS IV SOLN: INTRAVENOUS | Status: DC

## 2013-08-27 MED ORDER — FENTANYL CITRATE 0.05 MG/ML IJ SOLN
25.0000 ug | INTRAMUSCULAR | Status: DC | PRN
  Administered 2013-08-27: 25 ug via INTRAVENOUS
  Administered 2013-08-27: 50 ug via INTRAVENOUS

## 2013-08-27 MED ORDER — VANCOMYCIN HCL IN DEXTROSE 1-5 GM/200ML-% IV SOLN
INTRAVENOUS | Status: AC
  Filled 2013-08-27: qty 200

## 2013-08-27 MED ORDER — BUPIVACAINE HCL (PF) 0.25 % IJ SOLN
INTRAMUSCULAR | Status: AC
  Filled 2013-08-27: qty 30

## 2013-08-27 MED ORDER — MORPHINE SULFATE 10 MG/ML IJ SOLN: 2.0000 mg | INTRAMUSCULAR | Status: DC | PRN

## 2013-08-27 MED ORDER — FENTANYL CITRATE 0.05 MG/ML IJ SOLN
INTRAMUSCULAR | Status: DC | PRN
  Administered 2013-08-27: 50 ug via INTRAVENOUS

## 2013-08-27 MED ORDER — LACTATED RINGERS IV SOLN
INTRAVENOUS | Status: DC | PRN
  Administered 2013-08-27 (×2): via INTRAVENOUS

## 2013-08-27 MED ORDER — ACETAMINOPHEN 650 MG RE SUPP: 650.0000 mg | RECTAL | Status: DC | PRN

## 2013-08-27 MED ORDER — SODIUM CHLORIDE 0.9 % IJ SOLN: 3.0000 mL | INTRAMUSCULAR | Status: DC | PRN

## 2013-08-27 MED ORDER — SODIUM CHLORIDE 0.9 % IV SOLN: INTRAVENOUS | Status: DC

## 2013-08-27 MED ORDER — FENTANYL CITRATE 0.05 MG/ML IJ SOLN
INTRAMUSCULAR | Status: AC
  Filled 2013-08-27: qty 2

## 2013-08-27 MED ORDER — EPHEDRINE SULFATE 50 MG/ML IJ SOLN
INTRAMUSCULAR | Status: DC | PRN
  Administered 2013-08-27: 7.5 mg via INTRAVENOUS

## 2013-08-27 SURGICAL SUPPLY — 43 items
ADH SKN CLS APL DERMABOND .7 (GAUZE/BANDAGES/DRESSINGS)
BINDER BREAST LRG (GAUZE/BANDAGES/DRESSINGS) ×1 IMPLANT
BLADE HEX COATED 2.75 (ELECTRODE) ×2 IMPLANT
BLADE SURG 15 STRL LF DISP TIS (BLADE) ×3 IMPLANT
BLADE SURG 15 STRL SS (BLADE) ×4
CANISTER SUCTION 2500CC (MISCELLANEOUS) ×2 IMPLANT
CHLORAPREP W/TINT 26ML (MISCELLANEOUS) IMPLANT
CLOTH BEACON ORANGE TIMEOUT ST (SAFETY) ×2 IMPLANT
COVER SURGICAL LIGHT HANDLE (MISCELLANEOUS) ×1 IMPLANT
DECANTER SPIKE VIAL GLASS SM (MISCELLANEOUS) ×2 IMPLANT
DERMABOND ADVANCED (GAUZE/BANDAGES/DRESSINGS)
DERMABOND ADVANCED .7 DNX12 (GAUZE/BANDAGES/DRESSINGS) IMPLANT
DRAPE LAPAROSCOPIC ABDOMINAL (DRAPES) ×2 IMPLANT
ELECT REM PT RETURN 9FT ADLT (ELECTROSURGICAL) ×2
ELECTRODE REM PT RTRN 9FT ADLT (ELECTROSURGICAL) ×1 IMPLANT
GAUZE SPONGE 4X4 16PLY XRAY LF (GAUZE/BANDAGES/DRESSINGS) ×2 IMPLANT
GLOVE BIO SURGEON STRL SZ7 (GLOVE) ×2 IMPLANT
GLOVE BIOGEL PI IND STRL 7.0 (GLOVE) ×1 IMPLANT
GLOVE BIOGEL PI IND STRL 7.5 (GLOVE) ×1 IMPLANT
GLOVE BIOGEL PI INDICATOR 7.0 (GLOVE) ×1
GLOVE BIOGEL PI INDICATOR 7.5 (GLOVE) ×1
GOWN PREVENTION PLUS LG XLONG (DISPOSABLE) ×4 IMPLANT
GOWN PREVENTION PLUS XLARGE (GOWN DISPOSABLE) ×2 IMPLANT
GOWN STRL REIN XL XLG (GOWN DISPOSABLE) ×2 IMPLANT
KIT BASIN OR (CUSTOM PROCEDURE TRAY) ×2 IMPLANT
MARKER SKIN DUAL TIP RULER LAB (MISCELLANEOUS) ×2 IMPLANT
NDL HYPO 25X1 1.5 SAFETY (NEEDLE) ×1 IMPLANT
NEEDLE HYPO 22GX1.5 SAFETY (NEEDLE) IMPLANT
NEEDLE HYPO 25X1 1.5 SAFETY (NEEDLE) ×2 IMPLANT
PACK BASIC VI WITH GOWN DISP (CUSTOM PROCEDURE TRAY) ×2 IMPLANT
PENCIL BUTTON HOLSTER BLD 10FT (ELECTRODE) ×2 IMPLANT
SPONGE GAUZE 4X4 12PLY (GAUZE/BANDAGES/DRESSINGS) IMPLANT
STRIP CLOSURE SKIN 1/2X4 (GAUZE/BANDAGES/DRESSINGS) ×2 IMPLANT
SUT MNCRL AB 4-0 PS2 18 (SUTURE) ×2 IMPLANT
SUT SILK 2 0 SH (SUTURE) ×1 IMPLANT
SUT VIC AB 2-0 SH 27 (SUTURE) ×2
SUT VIC AB 2-0 SH 27X BRD (SUTURE) ×1 IMPLANT
SUT VIC AB 3-0 SH 27 (SUTURE) ×2
SUT VIC AB 3-0 SH 27XBRD (SUTURE) ×1 IMPLANT
SYR BULB IRRIGATION 50ML (SYRINGE) ×1 IMPLANT
SYR CONTROL 10ML LL (SYRINGE) ×2 IMPLANT
TOWEL OR 17X26 10 PK STRL BLUE (TOWEL DISPOSABLE) ×2 IMPLANT
YANKAUER SUCT BULB TIP 10FT TU (MISCELLANEOUS) ×1 IMPLANT

## 2013-08-27 NOTE — Preoperative (Signed)
Beta Blockers   Reason not to administer Beta Blockers:Not Applicable 

## 2013-08-27 NOTE — Transfer of Care (Signed)
Immediate Anesthesia Transfer of Care Note  Patient: Misty Wilkerson  Procedure(s) Performed: Procedure(s): RE-EXCISION OF BREAST LUMPECTOMY (Left)  Patient Location: PACU  Anesthesia Type:General  Level of Consciousness: awake, alert , oriented and patient cooperative  Airway & Oxygen Therapy: Patient Spontanous Breathing and Patient connected to face mask oxygen  Post-op Assessment: Report given to PACU RN and Post -op Vital signs reviewed and stable  Post vital signs: Reviewed and stable  Complications: No apparent anesthesia complications

## 2013-08-27 NOTE — H&P (Signed)
Misty Wilkerson is a 72 y.o. female. Referred by Dr Tapper/ Dr Dayton Scrape  HPI  2 yof who underwent prior left breast lumpectomy/snbx folowed by xrt for what sounds like T1bN0M0 er/pr pos her2 negative tumor in 2000. She tried both tamoxifen and arimidex but stopped both of these due to "anger issues". This significantly degraded her quality of life so she did not complete them. She has no complaints referable to either breast. She recently underwent routine screening mm that showed a small irregular mass in the upper portion of the left breast posteriorly. US showed an irregular round mass in 1030 position measuring 5 mm. No abnormal nodes were noted in the axilla. This underwent US guided biopsy with clip placement The biopsy shows idc with dcis, er/pr positive and her2 not amplified. Appears Ki is 11%. This is grade I tumor. We did lumpectomy with 2 tumors and she has margin positive. She has otherwise done well postop   Past Medical History   Diagnosis  Date   .  Arthritis    .  Cancer     Past Surgical History   Procedure  Laterality  Date   .  Breast lumpectomy     .  Cholecystectomy     History reviewed. No pertinent family history.  Social History  History   Substance Use Topics   .  Smoking status:  Former Smoker     Quit date:  07/22/1999   .  Smokeless tobacco:  Never Used   .  Alcohol Use:  Yes    Allergies   Allergen  Reactions   .  Penicillins  Shortness Of Breath and Rash    Current Outpatient Prescriptions   Medication  Sig  Dispense  Refill   .  loratadine (CLARITIN) 10 MG tablet  Take 10 mg by mouth daily.     .  meloxicam (MOBIC) 15 MG tablet      .  metoprolol succinate (TOPROL-XL) 50 MG 24 hr tablet      .  vitamin B-12 (CYANOCOBALAMIN) 1000 MCG tablet  Take 1,000 mcg by mouth daily.      No current facility-administered medications for this visit.   Review of Systems  Review of Systems  Constitutional: Negative for fever, chills and unexpected weight change.   HENT: Negative for hearing loss, congestion, sore throat, trouble swallowing and voice change.  Eyes: Negative for visual disturbance.  Respiratory: Negative for cough and wheezing.  Cardiovascular: Negative for chest pain, palpitations and leg swelling.  Gastrointestinal: Negative for nausea, vomiting, abdominal pain, diarrhea, constipation, blood in stool, abdominal distention and anal bleeding.  Genitourinary: Negative for hematuria, vaginal bleeding and difficulty urinating.  Musculoskeletal: Positive for arthralgias.  Skin: Negative for rash and wound.  Neurological: Negative for seizures, syncope and headaches.  Hematological: Negative for adenopathy. Bruises/bleeds easily.  Psychiatric/Behavioral: Negative for confusion.  Blood pressure 110/70, pulse 64, resp. rate 16, height 5\' 2"  (1.575 m), weight 158 lb 12.8 oz (72.031 kg).  Physical Exam  Physical Exam  Vitals reviewed.  Constitutional: She appears well-developed and well-nourished.  Cardiovascular: Normal rate, regular rhythm and normal heart sounds.  Pulmonary/Chest: Effort normal and breath sounds normal. She has no wheezes. She has no rales. Right breast exhibits no inverted nipple, no mass, no nipple discharge, no skin change and no tenderness. Left breast exhibits no inverted nipple, no mass, no nipple discharge, no skin change and no tenderness. Healing left breast superior incision    Assessment  Left breast cancer  with positive margin  Plan We discussed her pathology results today. I was unable to do a sentinel lymph node biopsy due to the fact that I could not identify a sentinel lymph node secondary to her prior surgery. I still think this is likely stage I disease. I again discussed the options which include proceeding with a mastectomy which would be considered the standard of care for her. We also discussed re excising the margin. I told her that certainly she would need to be on antiestrogen therapy. I also told  her that I do think that we should await the prognostic panel to ensure that this does have the same characteristics as the other.  I will schedule her for reexcision of the superior margin. We discussed the possibility of continued further positive margins and the fact that she may very well end up needing a mastectomy. She and her husband both understand these issues and will plan on proceeding

## 2013-08-27 NOTE — Anesthesia Preprocedure Evaluation (Addendum)
Anesthesia Evaluation  Patient identified by MRN, date of birth, ID band Patient awake    Reviewed: Allergy & Precautions, H&P , NPO status , Patient's Chart, lab work & pertinent test results  Airway Mallampati: II TM Distance: >3 FB Neck ROM: Full    Dental no notable dental hx.    Pulmonary neg pulmonary ROS,  breath sounds clear to auscultation  Pulmonary exam normal       Cardiovascular hypertension, Pt. on medications and Pt. on home beta blockers negative cardio ROS  Rhythm:Regular Rate:Normal     Neuro/Psych negative neurological ROS  negative psych ROS   GI/Hepatic negative GI ROS, Neg liver ROS,   Endo/Other  negative endocrine ROS  Renal/GU negative Renal ROS  negative genitourinary   Musculoskeletal negative musculoskeletal ROS (+)   Abdominal   Peds negative pediatric ROS (+)  Hematology negative hematology ROS (+)   Anesthesia Other Findings   Reproductive/Obstetrics negative OB ROS                          Anesthesia Physical Anesthesia Plan  ASA: II  Anesthesia Plan: General   Post-op Pain Management:    Induction: Intravenous  Airway Management Planned: LMA  Additional Equipment:   Intra-op Plan:   Post-operative Plan: Extubation in OR  Informed Consent: I have reviewed the patients History and Physical, chart, labs and discussed the procedure including the risks, benefits and alternatives for the proposed anesthesia with the patient or authorized representative who has indicated his/her understanding and acceptance.   Dental advisory given  Plan Discussed with: CRNA  Anesthesia Plan Comments:         Anesthesia Quick Evaluation

## 2013-08-27 NOTE — Op Note (Signed)
Preoperative diagnosis: Recurrent left breast cancer status post lumpectomy Postoperative diagnosis: Same as above Procedure: Reexcision of left breast superior margin Surgeon: Dr. Harden Mo Anesthesia: Gen. Estimated blood loss: Minimal Drains: None Complications: None Specimens: #1 superior margin marked with a short stitch superior, long stitch lateral, double stitch in the #2 posterior margin marked short stitch posterior Sponge and needle count correct at completion Disposition to recovery stable  Indication: This is a 72yo female with a prior left breast cancer treated with lumpectomy and radiation. She has a new breast cancer in her left breast. I discussed with her the options and recommended to her mastectomy. She fairly strongly wanted to proceed with a lumpectomy. At the time of her surgery she was noted to have 2 separate tumors. These were about 4 and 6 mm and were about 1.5 cm apart. They were both in the same quadrant of the breast. I excised both of these using 2 wires. The first tumor  has ductal carcinoma in situ that is less than 1 mm from the posterior margin. This was the fascia. The second tumor was invasive ductal carcinoma with associated DCIS that is broadly present at the superior margin. Both of these are estrogen and progesterone receptor positive. I again discussed with her options. She still like to try to do a lumpectomy although she cannot get radiation therapy. I think her recurrence is higher with this approach but she feels like she will be able to take the antiestrogen medication. I think due to this it would be reasonable to try to continue with the lumpectomy followed by antiestrogen therapy. She very much understands this and would like to proceed.  Procedure: After informed consent was obtained the patient was taken to the operating room. She will have sequential compression devices on her legs. She was given vancomycin due to her penicillin allergy. She  was placed under general anesthesia. Her left breast was prepped and draped in the standard sterile surgical fashion. A surgical timeout was performed.  I reentered her old incision. I infiltrated Marcaine throughout this area. The posterior margin was all the muscle. There is a small piece in the middle where they were with some residual fat or breast tissue that I excised this as the posterior margin. There is no other tissue around this at all. I then identified the superior margin with my clip in place. I then excised this entire superior margin from the skin all the way down to the pectoralis fascia. This was then marked as above. These were both passed off the table. I obtained hemostasis. Irrigation was performed. I closed the cavity with 2-0 Vicryl. I then closed the dermis with 3-0 Vicryl and the skin with 4-0 Monocryl. I placed Dermabond and Steri-Strips overlying this. A breast binder was placed. She tolerated this well was extubated and transferred to recovery stable.

## 2013-08-28 ENCOUNTER — Encounter (HOSPITAL_COMMUNITY): Payer: Self-pay | Admitting: General Surgery

## 2013-08-28 NOTE — Anesthesia Postprocedure Evaluation (Signed)
  Anesthesia Post-op Note  Patient: Misty Wilkerson  Procedure(s) Performed: Procedure(s) (LRB): RE-EXCISION OF BREAST LUMPECTOMY (Left)  Patient Location: PACU  Anesthesia Type: General  Level of Consciousness: awake and alert   Airway and Oxygen Therapy: Patient Spontanous Breathing  Post-op Pain: mild  Post-op Assessment: Post-op Vital signs reviewed, Patient's Cardiovascular Status Stable, Respiratory Function Stable, Patent Airway and No signs of Nausea or vomiting  Last Vitals:  Filed Vitals:   08/27/13 1102  BP: 151/64  Pulse: 62  Temp: 36.7 C  Resp: 16    Post-op Vital Signs: stable   Complications: No apparent anesthesia complications

## 2013-09-01 ENCOUNTER — Telehealth (INDEPENDENT_AMBULATORY_CARE_PROVIDER_SITE_OTHER): Payer: Self-pay

## 2013-09-01 ENCOUNTER — Other Ambulatory Visit (INDEPENDENT_AMBULATORY_CARE_PROVIDER_SITE_OTHER): Payer: Self-pay | Admitting: General Surgery

## 2013-09-01 DIAGNOSIS — C50212 Malignant neoplasm of upper-inner quadrant of left female breast: Secondary | ICD-10-CM

## 2013-09-01 NOTE — Telephone Encounter (Signed)
I discussed results with patient on phone.  Will refer to oncology

## 2013-09-01 NOTE — Telephone Encounter (Signed)
Pts husband Peyton Najjar called for path result. Path result given to him and he will be sure pt gets result. Pt to call with any concerns.

## 2013-09-04 ENCOUNTER — Telehealth: Payer: Self-pay | Admitting: *Deleted

## 2013-09-04 NOTE — Telephone Encounter (Signed)
Left message for pt to return my call so I can schedule her w/ a Med Onc. 

## 2013-09-07 ENCOUNTER — Telehealth: Payer: Self-pay | Admitting: *Deleted

## 2013-09-07 NOTE — Telephone Encounter (Signed)
Called pt to schedule an appt w/ the Med Onc and she is out of town and will be back for one day, but we cannot accommodate her on that day and then she will be going back out of town.  Confirmed 09/23/13 appt w/ pt.  Mailed before appt letter & packet to pt.  Emailed Dr. Dwain Sarna at [facility] to make him aware.  Took paperwork to Med Rec for chart.

## 2013-09-11 ENCOUNTER — Ambulatory Visit (INDEPENDENT_AMBULATORY_CARE_PROVIDER_SITE_OTHER): Payer: Medicare Other | Admitting: General Surgery

## 2013-09-11 ENCOUNTER — Encounter (INDEPENDENT_AMBULATORY_CARE_PROVIDER_SITE_OTHER): Payer: Self-pay | Admitting: General Surgery

## 2013-09-11 VITALS — BP 136/80 | HR 72 | Temp 97.8°F | Resp 14 | Ht 62.0 in | Wt 162.0 lb

## 2013-09-11 DIAGNOSIS — Z09 Encounter for follow-up examination after completed treatment for conditions other than malignant neoplasm: Secondary | ICD-10-CM

## 2013-09-11 NOTE — Progress Notes (Signed)
Subjective:     Patient ID: Misty Wilkerson, female   DOB: 1941-09-26, 72 y.o.   MRN: [id number]  HPI 72 year old female who has a prior left breast cancer treated with lumpectomy and radiation therapy. She presented with a new left breast cancer. This is hormone receptor positive. We discussed all of her options I told her that the standard of care was to proceed with a mastectomy. She very much wanted to attempt breast conservation therapy and then antiestrogen therapy. I took her to the operating room and end up excising 2 abnormal lesions. 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 This initially was hormone receptor positive. The margin was close posteriorly with this but this is the pectoralis muscle and there is no more tissue to remove in this position. She has a second tumor that is an invasive ductal carcinoma with no prognostic panel done at this point. I took her back to or and there is no more residual tumor present.  She is doing well without complaint today.  She is due to see Dr Welton Flakes soon.   Review of Systems     Objective:   Physical Exam Healing left breast incision without infection    Assessment:     S/p left breast lumpectomy     Plan:     This margin is clear and I think she does not need any more surgery.  She knows she needs antiestrogen therapy and will discuss this with med onc when she sees them.  I will see back in 6 months.  She is released to full activity.

## 2013-09-17 ENCOUNTER — Other Ambulatory Visit: Payer: Self-pay | Admitting: Emergency Medicine

## 2013-09-18 ENCOUNTER — Other Ambulatory Visit: Payer: Self-pay | Admitting: *Deleted

## 2013-09-18 ENCOUNTER — Telehealth: Payer: Self-pay | Admitting: Oncology

## 2013-09-18 DIAGNOSIS — C50212 Malignant neoplasm of upper-inner quadrant of left female breast: Secondary | ICD-10-CM

## 2013-09-21 ENCOUNTER — Other Ambulatory Visit: Payer: Self-pay | Admitting: Emergency Medicine

## 2013-09-21 ENCOUNTER — Telehealth: Payer: Self-pay | Admitting: *Deleted

## 2013-09-21 NOTE — Telephone Encounter (Signed)
Received an email from Tiffany that Dr. Welton Flakes is requesting this pt to be move to 10/28 at 2pm for her.  Called pt and confirmed 09/22/13 appt w/ pt.

## 2013-09-22 ENCOUNTER — Telehealth: Payer: Self-pay | Admitting: *Deleted

## 2013-09-22 ENCOUNTER — Encounter: Payer: Self-pay | Admitting: Oncology

## 2013-09-22 ENCOUNTER — Ambulatory Visit (HOSPITAL_BASED_OUTPATIENT_CLINIC_OR_DEPARTMENT_OTHER): Payer: Medicare Other | Admitting: Oncology

## 2013-09-22 ENCOUNTER — Telehealth: Payer: Self-pay | Admitting: Oncology

## 2013-09-22 ENCOUNTER — Ambulatory Visit: Payer: Medicare Other

## 2013-09-22 ENCOUNTER — Other Ambulatory Visit (HOSPITAL_BASED_OUTPATIENT_CLINIC_OR_DEPARTMENT_OTHER): Payer: Medicare Other | Admitting: Lab

## 2013-09-22 VITALS — BP 146/63 | HR 70 | Temp 98.2°F | Resp 18 | Ht 62.0 in | Wt 160.6 lb

## 2013-09-22 DIAGNOSIS — Z170 Estrogen receptor positive status [ER+]: Secondary | ICD-10-CM

## 2013-09-22 DIAGNOSIS — C50219 Malignant neoplasm of upper-inner quadrant of unspecified female breast: Secondary | ICD-10-CM

## 2013-09-22 DIAGNOSIS — C50212 Malignant neoplasm of upper-inner quadrant of left female breast: Secondary | ICD-10-CM

## 2013-09-22 DIAGNOSIS — I10 Essential (primary) hypertension: Secondary | ICD-10-CM

## 2013-09-22 LAB — CBC WITH DIFFERENTIAL/PLATELET
BASO%: 0.2 % (ref 0.0–2.0)
Basophils Absolute: 0 10*3/uL (ref 0.0–0.1)
Eosinophils Absolute: 0.3 10*3/uL (ref 0.0–0.5)
HGB: 11.9 g/dL (ref 11.6–15.9)
MONO%: 7.7 % (ref 0.0–14.0)
NEUT#: 2.6 10*3/uL (ref 1.5–6.5)
NEUT%: 58.7 % (ref 38.4–76.8)
Platelets: 176 10*3/uL (ref 145–400)
RBC: 3.87 10*6/uL (ref 3.70–5.45)
RDW: 13.2 % (ref 11.2–14.5)
WBC: 4.4 10*3/uL (ref 3.9–10.3)
lymph#: 1.2 10*3/uL (ref 0.9–3.3)

## 2013-09-22 LAB — COMPREHENSIVE METABOLIC PANEL (CC13)
AST: 23 U/L (ref 5–34)
Albumin: 3.8 g/dL (ref 3.5–5.0)
Alkaline Phosphatase: 65 U/L (ref 40–150)
BUN: 24.7 mg/dL (ref 7.0–26.0)
Calcium: 9.4 mg/dL (ref 8.4–10.4)
Chloride: 107 mEq/L (ref 98–109)
Creatinine: 1.3 mg/dL — ABNORMAL HIGH (ref 0.6–1.1)
Glucose: 88 mg/dl (ref 70–140)
Potassium: 3.9 mEq/L (ref 3.5–5.1)
Sodium: 140 mEq/L (ref 136–145)
Total Bilirubin: 0.32 mg/dL (ref 0.20–1.20)
Total Protein: 7.5 g/dL (ref 6.4–8.3)

## 2013-09-22 MED ORDER — LETROZOLE 2.5 MG PO TABS: 2.5000 mg | ORAL_TABLET | Freq: Every day | ORAL | Status: DC

## 2013-09-22 NOTE — Progress Notes (Signed)
Misty Wilkerson [id number] [birth year] 72 y.o. 09/22/2013 3:03 PM  CC Dr. Wyvonnia Lora Dr. [name]  REASON FOR CONSULTATION:  72 year old female with new diagnosis of left breast cancer. Patient is seen in medical oncology for discussion of treatment options.   STAGE:     REFERRING PHYSICIAN: Dr. [name]  HISTORY OF PRESENT ILLNESS:  Misty Wilkerson is a 72 y.o. female.  With prior oncologic history significant for history of left breast cancer treated with lumpectomy radiation therapy (T1 B. N0 M0) and 2000. She after the radiation therapy was initially started on tamoxifen for one month but could not tolerate it and per her recollection she then was switched to a remote ask but could not tolerate that either so therefore she discontinued all therapy. Patient continued to do well until she had a screening mammogram performed this year that showed a new abnormality in the left breast in the upper portion of the left breast posteriorly. She had a ultrasound done that showed irregular rounded mass in the 10:30 o'clock position measuring about 5 mm. No abnormal lymph nodes were noted. She had a biopsy performed the biopsy showed an invasive ductal carcinoma with DCIS. Tumor was ER positive PR positive HER-2/neu negative. There was a grade 1. Ki-67 was 11%. Patient was seen by Dr. [name] who by a standard of care recommended a mastectomy but the patient opted for a lumpectomy. The final pathology on the lumpectomy showed 2 areas one measuring 0.5 cm and a second area measuring 1.2 cm. Both ER positive PR positive. The second area Ki-67 and HER-2/neu has not been done but I have requested that they be done. Patient's case was discussed in the [facility] clinic. She is now seen in medical oncology for discussion of further treatment options. Past Medical History: Past Medical History  Diagnosis Date  . Arthritis   . Cancer   . H/O hiatal hernia     eats slowly   now  . Hypertension     Past Surgical History: Past Surgical History  Procedure Laterality Date  . Breast lumpectomy    . Cholecystectomy    . Eye surgery      bilateral cataracts  . Partial mastectomy with needle localization and axillary sentinel lymph node bx Left 08/12/2013    Procedure: LEFT PARTIAL MASTECTOMY WITH NEEDLE LOCALIZATION ATTEMPTED AXILLARY SENTINEL LYMPH NODE BIOPSY;  Surgeon: [name], MD;  Location: [facility] OR;  Service: General;  Laterality: Left;  . Re-excision of breast lumpectomy Left 08/27/2013    Procedure: RE-EXCISION OF BREAST LUMPECTOMY;  Surgeon: [name], MD;  Location: [facility] ORS;  Service: General;  Laterality: Left;    Family History: History reviewed. No pertinent family history.  Social History History  Substance Use Topics  . Smoking status: Former Smoker    Quit date: 07/22/1999  . Smokeless tobacco: Never Used  . Alcohol Use: Yes     Comment: occasional-- beer once in awhile  and maybe  1 glass white wine    Allergies: Allergies  Allergen Reactions  . Penicillins Shortness Of Breath and Rash  . Tetracyclines & Related Itching    Current Medications: Current Outpatient Prescriptions  Medication Sig Dispense Refill  . acetaminophen (TYLENOL) 500 MG tablet Take 1,000 mg by mouth daily as needed for pain.      Marland Kitchen loratadine (CLARITIN) 10 MG tablet Take 10 mg by mouth daily.      . metoprolol succinate (TOPROL-XL) 50 MG 24 hr tablet Take 50  mg by mouth daily.       Marland Kitchen triamterene-hydrochlorothiazide (MAXZIDE-25) 37.5-25 MG per tablet Take 1 tablet by mouth daily.      . vitamin B-12 (CYANOCOBALAMIN) 1000 MCG tablet Take 1,000 mcg by mouth daily.       No current facility-administered medications for this visit.    OB/GYN History:menarche 13, postmenopause, HRT 2 months, first live birth at 23  Fertility Discussion:N/A Prior History of Cancer: yes  Health Maintenance:  Colonoscopy yes  Bone Density yes Last PAP smear  no  ECOG PERFORMANCE STATUS: 0 - Asymptomatic  Genetic Counseling/testing: no  REVIEW OF SYSTEMS:  Comprehensive 14 point review of system was obtained and it is scant separately into the electronic medical record  PHYSICAL EXAMINATION: Blood pressure 146/63, pulse 70, temperature 98.2 F (36.8 C), temperature source Oral, resp. rate 18, height 5\' 2"  (1.575 m), weight 160 lb 9.6 oz (72.848 kg).  ZOX:WRUEA, healthy, no distress, well nourished and well developed SKIN: skin color, texture, turgor are normal HEAD: Normocephalic EYES: PERRLA, EOMI, Conjunctiva are pink and non-injected EARS: External ears normal OROPHARYNX:no exudate, no erythema and lips, buccal mucosa, and tongue normal  NECK: supple, no adenopathy, thyroid normal size, non-tender, without nodularity LYMPH:  no palpable lymphadenopathy, no hepatosplenomegaly BREAST:right breast normal without mass, skin or nipple changes or axillary nodes, surgical scars noted the upper portion of the left breast as well as prior lumpectomy. No nipple discharge or any other masses LUNGS: clear to auscultation and percussion HEART: regular rate & rhythm ABDOMEN:abdomen soft, non-tender and no masses or organomegaly BACK: No CVA tenderness EXTREMITIES:no edema, no clubbing, no cyanosis  NEURO: alert & oriented x 3 with fluent speech, no focal motor/sensory deficits, gait normal     STUDIES/RESULTS: No results found.   LABS:    Chemistry      Component Value Date/Time   NA 140 09/22/2013 1344   NA 137 08/07/2013 0919   K 3.9 09/22/2013 1344   K 4.2 08/07/2013 0919   CL 99 08/07/2013 0919   CO2 24 09/22/2013 1344   CO2 26 08/07/2013 0919   BUN 24.7 09/22/2013 1344   BUN 26* 08/07/2013 0919   CREATININE 1.3* 09/22/2013 1344   CREATININE 1.31* 08/07/2013 0919      Component Value Date/Time   CALCIUM 9.4 09/22/2013 1344   CALCIUM 9.9 08/07/2013 0919   ALKPHOS 65 09/22/2013 1344   AST 23 09/22/2013 1344   ALT 20 09/22/2013  1344   BILITOT 0.32 09/22/2013 1344      Lab Results  Component Value Date   WBC 4.4 09/22/2013   HGB 11.9 09/22/2013   HCT 36.2 09/22/2013   MCV 93.5 09/22/2013   PLT 176 09/22/2013   PATHOLOGY: ADDITIONAL INFORMATION: PROGNOSTIC INDICATORS - ACIS Results: IMMUNOHISTOCHEMICAL AND MORPHOMETRIC ANALYSIS BY THE AUTOMATED CELLULAR IMAGING SYSTEM (ACIS) Estrogen Receptor: 98%, POSITIVE, STRONG STAINING INTENSITY Progesterone Receptor: 14%, POSITIVE, WEAK STAINING INTENSITY REFERENCE RANGE ESTROGEN RECEPTOR NEGATIVE <1% POSITIVE =>1% PROGESTERONE RECEPTOR NEGATIVE <1% POSITIVE =>1% All controls stained appropriately Abigail Miyamoto MD Pathologist, Electronic Signature ( Signed 08/21/2013) FINAL DIAGNOSIS Diagnosis Breast, lumpectomy, left - TUMOR #1: INVASIVE DUCTAL CARCINOMA, SEE COMMENT. INVASIVE TUMOR IS 0.5 CM FROM NEAREST MARGIN (POSTERIOR). NO LYMPHOVASCULAR INVASION IDENTIFIED. DUCTAL CARCINOMA IN SITU. IN SITU CARCINOMA IS LESS THAN 1 MM FROM NEAREST MARGIN (POSTERIOR). 1 of 3 FINAL for Herbel, Krissie H ([id number]) Diagnosis(continued) - TUMOR #2: INVASIVE DUCTAL CARCINOMA. INVASIVE TUMOR IS BROADLY PRESENT AT SUPERIOR MARGIN. NO LYMPHOVASCULAR INVASION IDENTIFIED.  DUCTAL CARCINOMA IN SITU. IN SITU CARCINOMA IS 1.0 CM FROM NEAREST MARGIN (SUPERIOR). PREVIOUS BIOPSY SITE. - SEE TUMOR SYNOPTIC TEMPLATE BELOW. Microscopic Comment BREAST, INVASIVE TUMOR, WITHOUT LYMPH NODE SAMPLING Specimen, including laterality and lymph node sampling (sentinel, non-sentinel): Left breast without lymph node sampling. Procedure: Lumpectomy. Histologic type: Tumor number one and tumor number two equal ductal. Tumor number one: Grade: II of III Tubule formation: 3 Nuclear pleomorphism: 2 Mitotic: 1 Tumor number two: Grade: I of III. Tubule formation: 2 Nuclear pleomorphism: 2 Mitotic: 1 Tumor number one: Tumor size (gross measurement): 0.4 cm Tumor number  two: Tumor size (gross measurement): 1.2 cm Margins: Invasive, distance to closest margin: Positive superior margin (tumor number two). In-situ, distance to closest margin: Less than 1 mm (tumor number one). If margin positive, focally or broadly: Broadly. Lymphovascular invasion: Absent. Ductal carcinoma in situ: Present. Grade: I-II of III. Extensive intraductal component: Absent. Lobular neoplasia: Absent. Tumor focality: Multifocal. Treatment effect: None. If present, treatment effect in breast tissue, lymph nodes or both: N/A. Extent of tumor: Skin: N/A. Nipple: N/A. Skeletal muscle: N/A. Breast prognostic profile: See comment. Estrogen receptor: N/A. Progesterone receptor: N/A. Her 2 neu: N/A. Ki-67: N/A. Non-neoplastic breast: Previous biopsy site, fibrocystic change, coarse vascular calcifications, and microcalcifications in benign ducts and lobules. 2 of 3 FINAL for [facility], Dynasty H ([id number]) Microscopic Comment(continued) TNM: mpT1c, pNX, pMX. Comments: If clinically indicated, breast prognostic profile can be performed. (CR:ecj 08/13/2013) [place] RUND DO Pathologist, Electronic Signature (Case signed 08/13/2013) Specimen Gross and Clinical Information  ASSESSMENT    72 year old female with  #1 prior history of left breast cancer which was a stage I ER/PR positive HER-2/neu negative originally diagnosed in 2000 status post lumpectomy sentinel lymph node biopsy followed by radiation therapy in 2 months of antiestrogen therapy but discontinued due to the side effects. Patient would now new diagnosis of left breast cancer in the upper portion posteriorly measuring 0.5 cm and 1.2 cm. Tumors are ER positive PR positive. Patient is status post lumpectomy for her desire. She did not want a mastectomy. Patient is not a candidate for radiation therapy since she has had prior radiation. Patient could not have a sentinel lymph node biopsy performed either although it was  attempted by Dr. Dwain Sarna.  #2 patient and I discussed standard of care or breast cancer we also discussed the radiology and pathology as well as pathophysiology of breast cancer we discussed treatment options. She understands that the standard of care would be a mastectomy. However she has opted out of this and wants to do breast conservation. Patient and I discussed Oncotype DX testing. Patient does not want to have chemotherapy in any circumstances. Therefore we did discuss antiestrogen therapy. We discussed both amoxicillin as well as the aromatase inhibitors. She did state to me that she may be able to tolerate "pill" better now than back in 2001 to her being at a different stage in her life. She certainly is willing to try it this time around.  #3 patient and I discussed the chances of having distant disease at this time I think they are low. However patient is still very concerned about her lymph nodes as well as distant disease. She is opting for MRI and PET scan.  PLAN:    #1 patient will proceed with staging studies including PET CT  #2 we will also get MRI of the breasts.  3 she will begin on Femara 2.5 mg daily risks benefits and side effects of this were  explained to the patient. She understands that she will be on this for 5 years.  #4 I will see her back in 3-4 months time.       Thank you so much for allowing me to participate in the care of Misty Wilkerson. I will continue to follow up the patient with you and assist in her care.  All questions were answered. The patient knows to call the clinic with any problems, questions or concerns. We can certainly see the patient much sooner if necessary.  I spent 55 minutes counseling the patient face to face. The total time spent in the appointment was 60 minutes.   Drue Second, MD Medical/Oncology [facility] [phone] (beeper) [phone] (Office)  09/23/2013, 4:04 PM  09/22/2013, 3:03 PM

## 2013-09-22 NOTE — Patient Instructions (Signed)
We discussed the pathology and radiology. We discussed standard of care for breast cancer.  We discussed adjuvant antiestrogen therapy with letrozole. More information is as below.  We discussed staging studies the scheduled you for a PET scan as well as MRI of the breasts.  I will see you back in 3 months time. A prescription for the letrozole (Femara) was sent to your pharmacy  Letrozole tablets What is this medicine? LETROZOLE (LET roe zole) blocks the production of estrogen. Certain types of breast cancer grow under the influence of estrogen. Letrozole helps block tumor growth. This medicine is used to treat advanced breast cancer in postmenopausal women. This medicine may be used for other purposes; ask your health care provider or pharmacist if you have questions. What should I tell my health care provider before I take this medicine? They need to know if you have any of these conditions: -liver disease -osteoporosis (weak bones) -an unusual or allergic reaction to letrozole, other medicines, foods, dyes, or preservatives -pregnant or trying to get pregnant -breast-feeding How should I use this medicine? Take this medicine by mouth with a glass of water. You may take it with or without food. Follow the directions on the prescription label. Take your medicine at regular intervals. Do not take your medicine more often than directed. Do not stop taking except on your doctor's advice. Talk to your pediatrician regarding the use of this medicine in children. Special care may be needed. Overdosage: If you think you have taken too much of this medicine contact a poison control center or emergency room at once. NOTE: This medicine is only for you. Do not share this medicine with others. What if I miss a dose? If you miss a dose, take it as soon as you can. If it is almost time for your next dose, take only that dose. Do not take double or extra doses. What may interact with this medicine? Do  not take this medicine with any of the following medications: -estrogens, like hormone replacement therapy or birth control pills This medicine may also interact with the following medications: -dietary supplements such as androstenedione or DHEA -prasterone -tamoxifen This list may not describe all possible interactions. Give your health care provider a list of all the medicines, herbs, non-prescription drugs, or dietary supplements you use. Also tell them if you smoke, drink alcohol, or use illegal drugs. Some items may interact with your medicine. What should I watch for while using this medicine? Visit your doctor or health care professional for regular check-ups to monitor your condition. Do not use this drug if you are pregnant. Serious side effects to an unborn child are possible. Talk to your doctor or pharmacist for more information. You may get drowsy or dizzy. Do not drive, use machinery, or do anything that needs mental alertness until you know how this medicine affects you. Do not stand or sit up quickly, especially if you are an older patient. This reduces the risk of dizzy or fainting spells. What side effects may I notice from receiving this medicine? Side effects that you should report to your doctor or health care professional as soon as possible: -allergic reactions like skin rash, itching, or hives -bone fracture -chest pain -difficulty breathing or shortness of breath -severe pain, swelling, warmth in the leg -unusually weak or tired -vaginal bleeding Side effects that usually do not require medical attention (report to your doctor or health care professional if they continue or are bothersome): -bone, back, joint, or  muscle pain -dizziness -fatigue -fluid retention -headache -hot flashes, night sweats -nausea -weight gain This list may not describe all possible side effects. Call your doctor for medical advice about side effects. You may report side effects to FDA  at 1-800-FDA-1088. Where should I keep my medicine? Keep out of the reach of children. Store between 15 and 30 degrees C (59 and 86 degrees F). Throw away any unused medicine after the expiration date. NOTE: This sheet is a summary. It may not cover all possible information. If you have questions about this medicine, talk to your doctor, pharmacist, or health care provider.  2012, [employer]vier/Gold Standard. (01/23/2008 4:43:44 PM)

## 2013-09-22 NOTE — Progress Notes (Signed)
Checked in new pt with no financial concerns. °

## 2013-09-22 NOTE — Telephone Encounter (Signed)
error 

## 2013-09-23 ENCOUNTER — Other Ambulatory Visit: Payer: Medicare Other | Admitting: Lab

## 2013-09-23 ENCOUNTER — Ambulatory Visit: Payer: Medicare Other | Admitting: Oncology

## 2013-09-23 ENCOUNTER — Ambulatory Visit: Payer: Medicare Other

## 2013-10-01 ENCOUNTER — Encounter (HOSPITAL_COMMUNITY)
Admission: RE | Admit: 2013-10-01 | Discharge: 2013-10-01 | Disposition: A | Discharge status: Home / Self Care | Payer: Medicare Other | Source: Ambulatory Visit | Attending: Oncology | Admitting: Oncology

## 2013-10-01 ENCOUNTER — Encounter (HOSPITAL_COMMUNITY): Payer: Self-pay

## 2013-10-01 DIAGNOSIS — I700 Atherosclerosis of aorta: Secondary | ICD-10-CM | POA: Insufficient documentation

## 2013-10-01 DIAGNOSIS — C50919 Malignant neoplasm of unspecified site of unspecified female breast: Secondary | ICD-10-CM | POA: Insufficient documentation

## 2013-10-01 DIAGNOSIS — C50212 Malignant neoplasm of upper-inner quadrant of left female breast: Secondary | ICD-10-CM

## 2013-10-01 LAB — GLUCOSE, CAPILLARY: Glucose-Capillary: 89 mg/dL (ref 70–99)

## 2013-10-01 MED ORDER — FLUDEOXYGLUCOSE F - 18 (FDG) INJECTION
18.8000 | Freq: Once | INTRAVENOUS | Status: AC | PRN
  Administered 2013-10-01: 18.8 via INTRAVENOUS

## 2013-10-08 ENCOUNTER — Encounter (INDEPENDENT_AMBULATORY_CARE_PROVIDER_SITE_OTHER): Payer: Self-pay

## 2013-10-12 ENCOUNTER — Encounter: Payer: Self-pay | Admitting: *Deleted

## 2013-10-12 NOTE — Progress Notes (Signed)
Mailed after appt letter to pt. 

## 2013-10-13 ENCOUNTER — Telehealth: Payer: Self-pay | Admitting: *Deleted

## 2013-10-13 NOTE — Telephone Encounter (Signed)
Message copied by Cooper Render on Tue Oct 13, 2013 11:21 AM ------      Message from: Victorino December      Created: Tue Oct 13, 2013 11:04 AM      Regarding: RE: PET Results      Contact: [phone]       Please let patient know evidence of cancer in the body, pet shows post op changes      ----- Message -----         From: Jolinda Croak, RN         Sent: 10/13/2013  10:45 AM           To: Victorino December, MD, Illa Level, NP      Subject: PET Results                                              Pt called with concern regarding PET scan results from 11/6.      Please advise Misty Wilkerson       ------

## 2013-10-13 NOTE — Telephone Encounter (Signed)
Per MD, notified pt after clarification NO evidence of cancer in the body, pet shows post op changes. Pt verbalized understanding. No further concerns.

## 2013-12-14 ENCOUNTER — Other Ambulatory Visit: Payer: Self-pay | Admitting: *Deleted

## 2013-12-14 DIAGNOSIS — C50219 Malignant neoplasm of upper-inner quadrant of unspecified female breast: Secondary | ICD-10-CM

## 2013-12-15 ENCOUNTER — Ambulatory Visit (HOSPITAL_BASED_OUTPATIENT_CLINIC_OR_DEPARTMENT_OTHER): Payer: Medicare Other | Admitting: Oncology

## 2013-12-15 ENCOUNTER — Other Ambulatory Visit (HOSPITAL_BASED_OUTPATIENT_CLINIC_OR_DEPARTMENT_OTHER): Payer: Medicare Other

## 2013-12-15 ENCOUNTER — Encounter: Payer: Self-pay | Admitting: Oncology

## 2013-12-15 ENCOUNTER — Encounter (INDEPENDENT_AMBULATORY_CARE_PROVIDER_SITE_OTHER): Payer: Self-pay

## 2013-12-15 VITALS — BP 149/75 | HR 73 | Temp 98.5°F | Resp 18 | Ht 62.0 in | Wt 156.9 lb

## 2013-12-15 DIAGNOSIS — Z170 Estrogen receptor positive status [ER+]: Secondary | ICD-10-CM

## 2013-12-15 DIAGNOSIS — C50219 Malignant neoplasm of upper-inner quadrant of unspecified female breast: Secondary | ICD-10-CM

## 2013-12-15 LAB — CBC WITH DIFFERENTIAL/PLATELET
BASO%: 0.3 % (ref 0.0–2.0)
Basophils Absolute: 0 10*3/uL (ref 0.0–0.1)
EOS%: 7.7 % — AB (ref 0.0–7.0)
Eosinophils Absolute: 0.3 10*3/uL (ref 0.0–0.5)
HCT: 40.4 % (ref 34.8–46.6)
HGB: 13.6 g/dL (ref 11.6–15.9)
LYMPH%: 24.8 % (ref 14.0–49.7)
MCH: 31.6 pg (ref 25.1–34.0)
MCHC: 33.7 g/dL (ref 31.5–36.0)
MCV: 94 fL (ref 79.5–101.0)
MONO#: 0.4 10*3/uL (ref 0.1–0.9)
MONO%: 9.4 % (ref 0.0–14.0)
NEUT#: 2.6 10*3/uL (ref 1.5–6.5)
NEUT%: 57.8 % (ref 38.4–76.8)
Platelets: 194 10*3/uL (ref 145–400)
RBC: 4.3 10*6/uL (ref 3.70–5.45)
RDW: 12.6 % (ref 11.2–14.5)
WBC: 4.5 10*3/uL (ref 3.9–10.3)
lymph#: 1.1 10*3/uL (ref 0.9–3.3)

## 2013-12-15 LAB — COMPREHENSIVE METABOLIC PANEL (CC13)
ALT: 31 U/L (ref 0–55)
AST: 28 U/L (ref 5–34)
Albumin: 4.3 g/dL (ref 3.5–5.0)
Alkaline Phosphatase: 63 U/L (ref 40–150)
Anion Gap: 10 mEq/L (ref 3–11)
BUN: 24.2 mg/dL (ref 7.0–26.0)
CALCIUM: 9.8 mg/dL (ref 8.4–10.4)
CO2: 29 mEq/L (ref 22–29)
Chloride: 100 mEq/L (ref 98–109)
Creatinine: 1.3 mg/dL — ABNORMAL HIGH (ref 0.6–1.1)
Glucose: 99 mg/dl (ref 70–140)
POTASSIUM: 4.2 meq/L (ref 3.5–5.1)
Sodium: 138 mEq/L (ref 136–145)
TOTAL PROTEIN: 7.6 g/dL (ref 6.4–8.3)
Total Bilirubin: 0.6 mg/dL (ref 0.20–1.20)

## 2013-12-15 NOTE — Progress Notes (Signed)
OFFICE PROGRESS NOTE  CC**  Dr. Matthias Hughs Dr. Rolm Bookbinder  DIAGNOSIS: 73 year old female with new diagnosis of left breast cancer  Malignant neoplasm of upper inner quadrant of female breast   Primary site: Breast (Left)   Staging method: AJCC 7th Edition   Clinical: (T1a, N0, cM0)   Pathologic: Stage IB (T1a, N0, cM0) signed by Misty Robinson, MD on 12/15/2013  9:51 AM   Summary: Stage IB (T1a, N0, cM0)   PRIOR THERAPY:  #1 PatientWith prior oncologic history significant for history of left breast cancer treated with lumpectomy radiation therapy (T1 B. N0 M0) and 2000. She after the radiation therapy was initially started on tamoxifen for one month but could not tolerate it and per her recollection she then was switched to a remote ask but could not tolerate that either so therefore she discontinued all therapy.  #2 Patient continued to do well until she had a screening mammogram performed this year that showed a new abnormality in the left breast in the upper portion of the left breast posteriorly. She had a ultrasound done that showed irregular rounded mass in the 10:30 o'clock position measuring about 5 mm. No abnormal lymph nodes were noted. She had a biopsy performed the biopsy showed an invasive ductal carcinoma with DCIS. Tumor was ER positive PR positive HER-2/neu negative. There was a grade 1. Ki-67 was 11%. Patient was seen by Dr. Rolm Bookbinder who by a standard of care recommended a mastectomy but the patient opted for a lumpectomy. The final pathology on the lumpectomy showed 2 areas one measuring 0.5 cm and a second area measuring 1.2 cm. Both ER positive PR positive.  #3 patient was begun on antiestrogen therapy adjuvantly with curative intent consisting of letrozole 2.5 mg daily in October 2014. Total of 5 years of therapy is planned.   CURRENT THERAPY: Letrozole 2.5 mg daily  INTERVAL HISTORY: Misty Wilkerson 73 y.o. female returns for followup visit  today. She has been on letrozole now for 3 months. Overall she is tolerating it well. She is pleasantly surprised how well she is in fact tolerating it. Initially she did develop some headaches for about a month.has now dissipated. She is experiencing hot flashes and night sweats. They are not incapacitating but she is concerned. We did discuss the possibility of starting her on either Effexor or gabapentin to see if she has some relief. However she declined since she does not want to keep taking more and more medications. We also discussed the possibility of having her do acupuncture. She certainly will look into that. She is also concerned about the small mass of her left breast. We discussed getting her partial prosthesis. We discussed other strategies as well. Patient is planning on going to [place] for 6 weeks. Otherwise she is doing well she denies currently any headaches double vision blurring of vision no cough hemoptysis hematemesis no shortness of breath chest pains or palpitations no lower extremity swelling no vaginal bleeding or discharge. Remainder of the 10 point review of systems is negative.  MEDICAL HISTORY: Past Medical History  Diagnosis Date  . Arthritis   . Cancer   . H/O hiatal hernia     eats slowly  now  . Hypertension     ALLERGIES:  is allergic to penicillins and tetracyclines & related.  MEDICATIONS:  Current Outpatient Prescriptions  Medication Sig Dispense Refill  . acetaminophen (TYLENOL) 500 MG tablet Take 1,000 mg by mouth daily as needed for pain.      Marland Kitchen  letrozole (FEMARA) 2.5 MG tablet Take 1 tablet (2.5 mg total) by mouth daily.  30 tablet  6  . loratadine (CLARITIN) 10 MG tablet Take 10 mg by mouth daily.      . metoprolol succinate (TOPROL-XL) 50 MG 24 hr tablet Take 50 mg by mouth daily.       Marland Kitchen triamterene-hydrochlorothiazide (MAXZIDE-25) 37.5-25 MG per tablet Take 1 tablet by mouth daily.      . vitamin B-12 (CYANOCOBALAMIN) 1000 MCG tablet Take 1,000  mcg by mouth daily.       No current facility-administered medications for this visit.    SURGICAL HISTORY:  Past Surgical History  Procedure Laterality Date  . Breast lumpectomy    . Cholecystectomy    . Eye surgery      bilateral cataracts  . Partial mastectomy with needle localization and axillary sentinel lymph node bx Left 08/12/2013    Procedure: LEFT PARTIAL MASTECTOMY WITH NEEDLE LOCALIZATION ATTEMPTED AXILLARY SENTINEL LYMPH NODE BIOPSY;  Surgeon: Rolm Bookbinder, MD;  Location: [facility];  Service: General;  Laterality: Left;  . Re-excision of breast lumpectomy Left 08/27/2013    Procedure: RE-EXCISION OF BREAST LUMPECTOMY;  Surgeon: Rolm Bookbinder, MD;  Location: [facility];  Service: General;  Laterality: Left;    REVIEW OF SYSTEMS:  Pertinent items are noted in HPI.     PHYSICAL EXAMINATION: Blood pressure 149/75, pulse 73, temperature 98.5 F (36.9 C), temperature source Oral, resp. rate 18, height '5\' 2"'  (1.575 m), weight 156 lb 14.4 oz (71.169 kg). Body mass index is 28.69 kg/(m^2). ECOG PERFORMANCE STATUS: 0 - Asymptomatic   General appearance: alert, cooperative and appears stated age Lymph nodes: Cervical, supraclavicular, and axillary nodes normal. Resp: clear to auscultation bilaterally Back: symmetric, no curvature. ROM normal. No CVA tenderness. Cardio: regular rate and rhythm, S1, S2 normal, no murmur, click, rub or gallop GI: soft, non-tender; bowel sounds normal; no masses,  no organomegaly Extremities: extremities normal, atraumatic, no cyanosis or edema Neurologic: Grossly normal Left breast well-healed surgical scar there is a little concavity. Otherwise no masses nipple discharge. Right breast no masses or nipple discharge  LABORATORY DATA: Lab Results  Component Value Date   WBC 4.5 12/15/2013   HGB 13.6 12/15/2013   HCT 40.4 12/15/2013   MCV 94.0 12/15/2013   PLT 194 12/15/2013      Chemistry      Component Value Date/Time   NA 138 12/15/2013  0852   NA 137 08/07/2013 0919   K 4.2 12/15/2013 0852   K 4.2 08/07/2013 0919   CL 99 08/07/2013 0919   CO2 29 12/15/2013 0852   CO2 26 08/07/2013 0919   BUN 24.2 12/15/2013 0852   BUN 26* 08/07/2013 0919   CREATININE 1.3* 12/15/2013 0852   CREATININE 1.31* 08/07/2013 0919      Component Value Date/Time   CALCIUM 9.8 12/15/2013 0852   CALCIUM 9.9 08/07/2013 0919   ALKPHOS 63 12/15/2013 0852   AST 28 12/15/2013 0852   ALT 31 12/15/2013 0852   BILITOT 0.60 12/15/2013 0852       RADIOGRAPHIC STUDIES:  No results found.  ASSESSMENT: 73 year old female with  #1 diagnosis of invasive ductal carcinoma patient is status post lumpectomy. She declined having a mastectomy. She is currently on adjuvant antiestrogen therapy with letrozole 2.5 mg daily. Overall she is tolerating it well except for night sweats. We discussed different strategies of trying to prevent them.  #2 breast asymmetry: We discussed partial prosthesis and a prescription was  given to her to [facility].  #3 we discussed exercise eating healthy maintaining a good weight.   PLAN:   #1 continue letrozole 2.5 mg daily. Total of 5 years is planned.  #2 patient will be seen back in 6 months time for followup.   All questions were answered. The patient knows to call the clinic with any problems, questions or concerns. We can certainly see the patient much sooner if necessary.  I spent 15 minutes counseling the patient face to face. The total time spent in the appointment was 30 minutes.    Marcy Panning, MD Medical/Oncology [facility] [phone] (beeper) [phone] (Office)  12/15/2013, 9:49 AM

## 2013-12-16 ENCOUNTER — Telehealth: Payer: Self-pay | Admitting: Oncology

## 2013-12-16 NOTE — Telephone Encounter (Signed)
S/w pt re appt for 7/23

## 2014-03-03 ENCOUNTER — Ambulatory Visit (INDEPENDENT_AMBULATORY_CARE_PROVIDER_SITE_OTHER): Payer: Medicare Other | Admitting: General Surgery

## 2014-03-03 ENCOUNTER — Other Ambulatory Visit (INDEPENDENT_AMBULATORY_CARE_PROVIDER_SITE_OTHER): Payer: Self-pay

## 2014-03-03 ENCOUNTER — Encounter (INDEPENDENT_AMBULATORY_CARE_PROVIDER_SITE_OTHER): Payer: Self-pay | Admitting: General Surgery

## 2014-03-03 VITALS — BP 132/88 | HR 80 | Temp 97.2°F | Resp 16 | Wt 158.8 lb

## 2014-03-03 DIAGNOSIS — C50219 Malignant neoplasm of upper-inner quadrant of unspecified female breast: Secondary | ICD-10-CM

## 2014-03-03 MED ORDER — UNABLE TO FIND: Status: DC

## 2014-03-03 NOTE — Progress Notes (Signed)
Subjective:     Patient ID: Misty Wilkerson, female   DOB: May 25, 1941, 73 y.o.   MRN: [id number]  HPI 73 year old female who has a prior left breast cancer treated with lumpectomy and radiation therapy. She presented with a new left breast cancer. This is hormone receptor positive. We discussed all of her options I told her that the standard of care was to proceed with a mastectomy. She very much wanted to attempt breast conservation therapy and then antiestrogen therapy. I took her to the operating room and end up excising 2 abnormal lesions. 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 This initially was hormone receptor positive. The margin was close posteriorly with this but this is the pectoralis muscle and there is no more tissue to remove in this position. She has a second tumor that is an invasive ductal carcinoma with no prognostic panel done at this point. I [place] her back to [place] and there is no more residual tumor present. She is doing well without complaint today. She is tolerating letrozole well without any side effects now.  She notes no changes in her self exam.   Review of Systems     Objective:   Physical Exam  Vitals reviewed. Constitutional: She appears well-developed and well-nourished.  Pulmonary/Chest: Right breast exhibits no inverted nipple, no mass, no nipple discharge, no skin change and no tenderness. Left breast exhibits tenderness. Left breast exhibits no inverted nipple, no mass, no nipple discharge and no skin change.    Lymphadenopathy:    She has no cervical adenopathy.    She has no axillary adenopathy.       Right: No supraclavicular adenopathy present.       Left: No supraclavicular adenopathy present.       Assessment:     Stage I breast cancer     Plan:     She has no clinical evidence of recurrence on her exam. She has a mammogram due later this year. She is doing very well on her  antiestrogen therapy. Her arm is also doing very well. She is going to come back and see me in one year. She will see medical oncology in 6 months. She's going to continue her own self exams.

## 2014-03-03 NOTE — Patient Instructions (Signed)

## 2014-03-05 ENCOUNTER — Other Ambulatory Visit (INDEPENDENT_AMBULATORY_CARE_PROVIDER_SITE_OTHER): Payer: Self-pay

## 2014-03-05 DIAGNOSIS — C50219 Malignant neoplasm of upper-inner quadrant of unspecified female breast: Secondary | ICD-10-CM

## 2014-03-05 MED ORDER — UNABLE TO FIND: Status: DC

## 2014-05-24 ENCOUNTER — Other Ambulatory Visit: Payer: Self-pay | Admitting: Oncology

## 2014-05-24 DIAGNOSIS — C50212 Malignant neoplasm of upper-inner quadrant of left female breast: Secondary | ICD-10-CM

## 2014-06-03 ENCOUNTER — Telehealth: Payer: Self-pay | Admitting: Hematology

## 2014-06-03 NOTE — Telephone Encounter (Signed)
, °

## 2014-06-15 ENCOUNTER — Other Ambulatory Visit: Payer: Self-pay | Admitting: *Deleted

## 2014-06-15 DIAGNOSIS — C50219 Malignant neoplasm of upper-inner quadrant of unspecified female breast: Secondary | ICD-10-CM

## 2014-06-17 ENCOUNTER — Ambulatory Visit (HOSPITAL_BASED_OUTPATIENT_CLINIC_OR_DEPARTMENT_OTHER): Payer: Medicare Other | Admitting: Hematology

## 2014-06-17 ENCOUNTER — Other Ambulatory Visit (HOSPITAL_BASED_OUTPATIENT_CLINIC_OR_DEPARTMENT_OTHER): Payer: Medicare Other

## 2014-06-17 ENCOUNTER — Ambulatory Visit: Payer: Medicare Other | Admitting: Oncology

## 2014-06-17 ENCOUNTER — Encounter: Payer: Self-pay | Admitting: Hematology

## 2014-06-17 VITALS — BP 165/60 | HR 67 | Temp 97.9°F | Resp 18 | Ht 62.0 in | Wt 162.1 lb

## 2014-06-17 DIAGNOSIS — C50919 Malignant neoplasm of unspecified site of unspecified female breast: Secondary | ICD-10-CM

## 2014-06-17 DIAGNOSIS — Z170 Estrogen receptor positive status [ER+]: Secondary | ICD-10-CM

## 2014-06-17 DIAGNOSIS — C50219 Malignant neoplasm of upper-inner quadrant of unspecified female breast: Secondary | ICD-10-CM

## 2014-06-17 LAB — CBC WITH DIFFERENTIAL/PLATELET
BASO%: 0.5 % (ref 0.0–2.0)
Basophils Absolute: 0 10*3/uL (ref 0.0–0.1)
EOS%: 4.9 % (ref 0.0–7.0)
Eosinophils Absolute: 0.2 10*3/uL (ref 0.0–0.5)
HCT: 38 % (ref 34.8–46.6)
HGB: 12.4 g/dL (ref 11.6–15.9)
LYMPH%: 23.3 % (ref 14.0–49.7)
MCH: 30.7 pg (ref 25.1–34.0)
MCHC: 32.6 g/dL (ref 31.5–36.0)
MCV: 94.3 fL (ref 79.5–101.0)
MONO#: 0.4 10*3/uL (ref 0.1–0.9)
MONO%: 8.1 % (ref 0.0–14.0)
NEUT#: 2.9 10*3/uL (ref 1.5–6.5)
NEUT%: 63.2 % (ref 38.4–76.8)
Platelets: 193 10*3/uL (ref 145–400)
RBC: 4.03 10*6/uL (ref 3.70–5.45)
RDW: 12.4 % (ref 11.2–14.5)
WBC: 4.6 10*3/uL (ref 3.9–10.3)
lymph#: 1.1 10*3/uL (ref 0.9–3.3)

## 2014-06-17 LAB — COMPREHENSIVE METABOLIC PANEL (CC13)
ALK PHOS: 64 U/L (ref 40–150)
ALT: 29 U/L (ref 0–55)
AST: 29 U/L (ref 5–34)
Albumin: 4.1 g/dL (ref 3.5–5.0)
Anion Gap: 9 mEq/L (ref 3–11)
BUN: 32 mg/dL — ABNORMAL HIGH (ref 7.0–26.0)
CO2: 23 mEq/L (ref 22–29)
Calcium: 9.6 mg/dL (ref 8.4–10.4)
Chloride: 106 mEq/L (ref 98–109)
Creatinine: 1.4 mg/dL — ABNORMAL HIGH (ref 0.6–1.1)
GLUCOSE: 94 mg/dL (ref 70–140)
POTASSIUM: 4.4 meq/L (ref 3.5–5.1)
SODIUM: 138 meq/L (ref 136–145)
TOTAL PROTEIN: 7.4 g/dL (ref 6.4–8.3)
Total Bilirubin: 0.49 mg/dL (ref 0.20–1.20)

## 2014-06-17 NOTE — Progress Notes (Signed)
OFFICE PROGRESS NOTE  CC**  Dr. Matthias Hughs Dr. Rolm Bookbinder  DIAGNOSIS: 73 year old female with new diagnosis of left breast cancer  Malignant neoplasm of upper inner quadrant of female breast   Primary site: Breast (Left)   Staging method: AJCC 7th Edition   Clinical: (T1a, N0, cM0)   Pathologic: Stage IB (T1a, N0, cM0) signed by [name], MD on 12/15/2013  9:51 AM   Summary: Stage IB (T1a, N0, cM0)   PRIOR THERAPY:  #1 PatientWith prior oncologic history significant for history of left breast cancer treated with lumpectomy radiation therapy (T1 B. N0 M0) and 2000. She after the radiation therapy was initially started on tamoxifen for one month but could not tolerate it and per her recollection she then was switched to a another medicine but could not tolerate that either so therefore she discontinued all therapy.  #2 Patient continued to do well until she had a screening mammogram performed this year 2014 that showed a new abnormality in the left breast in the upper portion of the left breast posteriorly. She had a ultrasound done that showed irregular rounded mass in the 10:30 o'clock position measuring about 5 mm. No abnormal lymph nodes were noted. She had a biopsy performed the biopsy showed an invasive ductal carcinoma with DCIS. Tumor was ER positive PR positive HER-2/neu negative. There was a grade 1. Ki-67 was 11%. Patient was seen by Dr. Rolm Bookbinder who by a standard of care recommended a mastectomy but the patient opted for a lumpectomy. The final pathology on the lumpectomy showed 2 areas one measuring 0.5 cm and a second area measuring 1.2 cm. Both ER positive PR positive.  #3 patient was begun on antiestrogen therapy adjuvantly with curative intent consisting of letrozole 2.5 mg daily in October 2014. Total of 5 years of therapy is planned.   CURRENT THERAPY: Letrozole 2.5 mg daily  INTERVAL HISTORY:  Misty Wilkerson 73 y.o. female returns for  followup visit today. She has been on letrozole now since October 2014. Overall she is tolerating it well. She is pleasantly surprised how well she is in fact tolerating it. Her biggest complaint is having some neck pain which has been there for last 3 weeks. Usually the stiffness in the morning and it tends to radiate to the left shoulder blade or trapezius muscle that area is tender I feel that is an effect of letrozole. I told the patient and her husband that if it does not get better in about 4 weeks or especially if it gets worse we will consider doing an imaging study on that she also noticed some tremors in her left hand but her muscle strength is good it could be age related tremors caledl benign essential tremors. She is religiously taking her letrozole. Patient is right-handed. She is also gained about 6-7 pounds since January 2015 and that can also be a medication effect. She exercises regularly, she walked regularly and she bikes regularly. For her neck pain she's been using 2 Tylenol when necessary and sometimes meloxicam.  MEDICAL HISTORY: Past Medical History  Diagnosis Date  . Arthritis   . Cancer   . H/O hiatal hernia     eats slowly  now  . Hypertension     ALLERGIES:  is allergic to penicillins and tetracyclines & related.  MEDICATIONS:  Current Outpatient Prescriptions  Medication Sig Dispense Refill  . acetaminophen (TYLENOL) 500 MG tablet Take 1,000 mg by mouth daily as needed for pain.      Marland Kitchen  letrozole (FEMARA) 2.5 MG tablet TAKE 1 TABLET BY MOUTH EVERY DAY  30 tablet  1  . loratadine (CLARITIN) 10 MG tablet Take 10 mg by mouth daily.      . metoprolol succinate (TOPROL-XL) 50 MG 24 hr tablet Take 50 mg by mouth daily.       Marland Kitchen triamterene-hydrochlorothiazide (MAXZIDE-25) 37.5-25 MG per tablet Take 1 tablet by mouth daily.      Marland Kitchen UNABLE TO FIND RX: [id number] silicone breast prosthesis Qty: 1 DX: 174.9 Left mastectomy  1 each  0  . UNABLE TO FIND [id number] Surgical Prosthetic Bra  Qty: 6 DX: 174.9  6 each    . vitamin B-12 (CYANOCOBALAMIN) 1000 MCG tablet Take 1,000 mcg by mouth daily.       No current facility-administered medications for this visit.    SURGICAL HISTORY:  Past Surgical History  Procedure Laterality Date  . Breast lumpectomy    . Cholecystectomy    . Eye surgery      bilateral cataracts  . Partial mastectomy with needle localization and axillary sentinel lymph node bx Left 08/12/2013    Procedure: LEFT PARTIAL MASTECTOMY WITH NEEDLE LOCALIZATION ATTEMPTED AXILLARY SENTINEL LYMPH NODE BIOPSY;  Surgeon: Rolm Bookbinder, MD;  Location: [facility];  Service: General;  Laterality: Left;  . Re-excision of breast lumpectomy Left 08/27/2013    Procedure: RE-EXCISION OF BREAST LUMPECTOMY;  Surgeon: Rolm Bookbinder, MD;  Location: [facility];  Service: General;  Laterality: Left;    REVIEW OF SYSTEMS:  Pertinent items are noted in HPI. no headaches, fever, chills, anorexia. She has gained some weight. No shortness of breath chest pain palpitations orthopnea PND. No abdominal pain nausea vomiting diarrhea no change in her bowel habits. No skin problems. She does have some left shoulder and neck pain which is the onset. When she takes a shower it gets better. Patient is very active and watches her diet. Rest of the 10 point review systems negative.    PHYSICAL EXAMINATION: Blood pressure 165/60, pulse 67, temperature 97.9 F (36.6 C), temperature source Oral, resp. rate 18, height '5\' 2"'  (1.575 m), weight 162 lb 1.6 oz (73.528 kg). Body mass index is 29.64 kg/(m^2). ECOG PERFORMANCE STATUS: 0 - Asymptomatic   General appearance: alert, cooperative and appears stated age Lymph nodes: Cervical, supraclavicular, and axillary nodes normal. Resp: clear to auscultation bilaterally Back: symmetric, no curvature. ROM normal. No CVA tenderness. Cardio: regular rate and rhythm, S1, S2 normal, no murmur, click, rub or gallop GI: soft, non-tender; bowel sounds normal; no  masses,  no organomegaly Extremities: extremities normal, atraumatic, no cyanosis or edema Neurologic: Grossly normal Left breast well-healed surgical scar there is a little concavity. Otherwise no masses nipple discharge. Right breast no masses or nipple discharge  LABORATORY DATA: Lab Results  Component Value Date   WBC 4.6 06/17/2014   HGB 12.4 06/17/2014   HCT 38.0 06/17/2014   MCV 94.3 06/17/2014   PLT 193 06/17/2014      Chemistry      Component Value Date/Time   NA 138 06/17/2014 1344   NA 137 08/07/2013 0919   K 4.4 06/17/2014 1344   K 4.2 08/07/2013 0919   CL 99 08/07/2013 0919   CO2 23 06/17/2014 1344   CO2 26 08/07/2013 0919   BUN 32.0* 06/17/2014 1344   BUN 26* 08/07/2013 0919   CREATININE 1.4* 06/17/2014 1344   CREATININE 1.31* 08/07/2013 0919      Component Value Date/Time   CALCIUM 9.6 06/17/2014 1344  CALCIUM 9.9 08/07/2013 0919   ALKPHOS 64 06/17/2014 1344   AST 29 06/17/2014 1344   ALT 29 06/17/2014 1344   BILITOT 0.49 06/17/2014 1344       RADIOGRAPHIC STUDIES:  No results found.  ASSESSMENT: 73 year old female with  #1 diagnosis of invasive ductal carcinoma patient is status post lumpectomy 2014. She declined having a mastectomy. She is currently on adjuvant antiestrogen therapy with letrozole 2.5 mg daily since October 2014. Overall she is tolerating it well.   #2 we discussed exercise eating healthy maintaining a good weight.  #3 Neck pain and left shoulder blade pain likely musculosketal and stress related.    PLAN:   #1 continue letrozole 2.5 mg daily. Total of 5 years is planned.  #2 patient will be seen back in 6 months time for followup.  #3 if her pain persists or gets worse, we will consider an imaging study for that. Patient and husband will call me.   All questions were answered. The patient knows to call the clinic with any problems, questions or concerns. We can certainly see the patient much sooner if necessary.  I spent 15 minutes  counseling the patient face to face. The total time spent in the appointment was 30 minutes.    Bernadene Bell, MD Medical Hematologist/Oncologist [place] Pager: [phone] Office No: [phone]   06/17/2014, 2:39 PM

## 2014-06-18 ENCOUNTER — Telehealth: Payer: Self-pay | Admitting: Hematology

## 2014-06-18 NOTE — Telephone Encounter (Signed)
s.w. pt and advised on Jan 2016 appt

## 2014-07-25 ENCOUNTER — Other Ambulatory Visit: Payer: Self-pay | Admitting: Adult Health

## 2014-09-21 ENCOUNTER — Other Ambulatory Visit: Payer: Self-pay | Admitting: Adult Health

## 2014-11-23 ENCOUNTER — Other Ambulatory Visit: Payer: Self-pay | Admitting: *Deleted

## 2014-11-23 DIAGNOSIS — C50912 Malignant neoplasm of unspecified site of left female breast: Secondary | ICD-10-CM

## 2014-11-23 MED ORDER — LETROZOLE 2.5 MG PO TABS
2.5000 mg | ORAL_TABLET | Freq: Every day | ORAL | Status: DC
Start: 2014-11-23 — End: 2014-11-24

## 2014-11-24 ENCOUNTER — Other Ambulatory Visit: Payer: Self-pay | Admitting: *Deleted

## 2014-11-24 DIAGNOSIS — C50912 Malignant neoplasm of unspecified site of left female breast: Secondary | ICD-10-CM

## 2014-11-24 MED ORDER — LETROZOLE 2.5 MG PO TABS: 2.5000 mg | ORAL_TABLET | Freq: Every day | ORAL | Status: DC

## 2014-12-08 ENCOUNTER — Other Ambulatory Visit (HOSPITAL_BASED_OUTPATIENT_CLINIC_OR_DEPARTMENT_OTHER): Payer: Medicare Other

## 2014-12-08 ENCOUNTER — Telehealth: Payer: Self-pay | Admitting: Hematology and Oncology

## 2014-12-08 ENCOUNTER — Ambulatory Visit (HOSPITAL_BASED_OUTPATIENT_CLINIC_OR_DEPARTMENT_OTHER): Payer: Medicare Other | Admitting: Hematology and Oncology

## 2014-12-08 VITALS — BP 139/68 | HR 71 | Temp 98.2°F | Resp 18 | Ht 62.0 in | Wt 157.6 lb

## 2014-12-08 DIAGNOSIS — Z170 Estrogen receptor positive status [ER+]: Secondary | ICD-10-CM

## 2014-12-08 DIAGNOSIS — C50212 Malignant neoplasm of upper-inner quadrant of left female breast: Secondary | ICD-10-CM

## 2014-12-08 DIAGNOSIS — C50919 Malignant neoplasm of unspecified site of unspecified female breast: Secondary | ICD-10-CM

## 2014-12-08 LAB — CBC WITH DIFFERENTIAL/PLATELET
BASO%: 0.5 % (ref 0.0–2.0)
BASOS ABS: 0 10*3/uL (ref 0.0–0.1)
EOS%: 4.9 % (ref 0.0–7.0)
Eosinophils Absolute: 0.3 10*3/uL (ref 0.0–0.5)
HCT: 41.8 % (ref 34.8–46.6)
HGB: 13.4 g/dL (ref 11.6–15.9)
LYMPH%: 22.6 % (ref 14.0–49.7)
MCH: 30.2 pg (ref 25.1–34.0)
MCHC: 32.1 g/dL (ref 31.5–36.0)
MCV: 94.1 fL (ref 79.5–101.0)
MONO#: 0.4 10*3/uL (ref 0.1–0.9)
MONO%: 8.3 % (ref 0.0–14.0)
NEUT#: 3.4 10*3/uL (ref 1.5–6.5)
NEUT%: 63.7 % (ref 38.4–76.8)
Platelets: 228 10*3/uL (ref 145–400)
RBC: 4.44 10*6/uL (ref 3.70–5.45)
RDW: 13 % (ref 11.2–14.5)
WBC: 5.4 10*3/uL (ref 3.9–10.3)
lymph#: 1.2 10*3/uL (ref 0.9–3.3)

## 2014-12-08 LAB — COMPREHENSIVE METABOLIC PANEL (CC13)
ALT: 22 U/L (ref 0–55)
AST: 25 U/L (ref 5–34)
Albumin: 4.4 g/dL (ref 3.5–5.0)
Alkaline Phosphatase: 71 U/L (ref 40–150)
Anion Gap: 12 mEq/L — ABNORMAL HIGH (ref 3–11)
BUN: 22.4 mg/dL (ref 7.0–26.0)
CHLORIDE: 98 meq/L (ref 98–109)
CO2: 27 mEq/L (ref 22–29)
Calcium: 9.5 mg/dL (ref 8.4–10.4)
Creatinine: 1.2 mg/dL — ABNORMAL HIGH (ref 0.6–1.1)
EGFR: 44 mL/min/{1.73_m2} — AB (ref >90)
GLUCOSE: 91 mg/dL (ref 70–140)
POTASSIUM: 4.1 meq/L (ref 3.5–5.1)
Sodium: 137 mEq/L (ref 136–145)
Total Bilirubin: 0.52 mg/dL (ref 0.20–1.20)
Total Protein: 7.8 g/dL (ref 6.4–8.3)

## 2014-12-08 NOTE — Telephone Encounter (Signed)
per pof to sch pt appt-gave pt copy of sch-sch pt mamma

## 2014-12-08 NOTE — Progress Notes (Signed)
Patient Care Team: Provider Not In System as PCP - General  DIAGNOSIS: Breast cancer of upper-inner quadrant of left female breast   Staging form: Breast, AJCC 7th Edition     Clinical: T1a, N0, cM0 - Unsigned     Pathologic: Stage IB (T1a, N0, cM0) - Signed by [name], MD on 12/15/2013   SUMMARY OF ONCOLOGIC HISTORY:   Breast cancer of upper-inner quadrant of left female breast   12/08/1998 Initial Biopsy Left breast cancer: Lumpectomy followed by radiation T1 BN 0M0 could not tolerate tamoxifen or alternative therapies.   08/12/2013 Surgery Left breast lumpectomy: Tumor 1  invasive ductal carcinoma 0.4 cm with DCIS, tumor to: IDC 1.2 cm with DCIS positive margin, ER 98%, PR 14%, HER-2 negative, Ki-67 19% margins reexcision negative   09/07/2013 -  Anti-estrogen oral therapy Letrozole 2.5 mg daily plans for 5 years    CHIEF COMPLIANT: Follow-up of breast cancer  INTERVAL HISTORY: Misty Wilkerson is a 74 year old lady with above-mentioned history of left breast cancer treated with lumpectomy twice when she developed recurrence in September 2014. She has been on oral antiestrogen therapy with letrozole and tolerating it extremely well. She has not had a mammogram since that occurrence. It appears that she previously had her mammograms and even and for some reason she missed getting subsequent mammograms. She denies any new lumps or nodules in the breasts. Left breast is slightly deformed from effects of 2 lumpectomies.  REVIEW OF SYSTEMS:   Constitutional: Denies fevers, chills or abnormal weight loss Eyes: Denies blurriness of vision Ears, nose, mouth, throat, and face: Denies mucositis or sore throat Respiratory: Denies cough, dyspnea or wheezes Cardiovascular: Denies palpitation, chest discomfort or lower extremity swelling Gastrointestinal:  Denies nausea, heartburn or change in bowel habits Skin: Denies abnormal skin rashes Lymphatics: Denies new lymphadenopathy or easy  bruising Neurological:Denies numbness, tingling or new weaknesses Behavioral/Psych: Mood is stable, no new changes  Breast: deformed left breast from prior surgery but otherwise no new changes All other systems were reviewed with the patient and are negative.  I have reviewed the past medical history, past surgical history, social history and family history with the patient and they are unchanged from previous note.  ALLERGIES:  is allergic to penicillins and tetracyclines & related.  MEDICATIONS:  Current Outpatient Prescriptions  Medication Sig Dispense Refill  . acetaminophen (TYLENOL) 500 MG tablet Take 1,000 mg by mouth daily as needed for pain.    . cholecalciferol (VITAMIN D) 400 UNITS TABS tablet Take 400 Units by mouth.    . letrozole (FEMARA) 2.5 MG tablet Take 1 tablet (2.5 mg total) by mouth daily. 30 tablet 0  . loratadine (CLARITIN) 10 MG tablet Take 10 mg by mouth daily.    . metoprolol succinate (TOPROL-XL) 50 MG 24 hr tablet Take 25 mg by mouth daily.     Marland Kitchen triamterene-hydrochlorothiazide (MAXZIDE-25) 37.5-25 MG per tablet Take 1 tablet by mouth daily.    Marland Kitchen UNABLE TO FIND RX: [id number] silicone breast prosthesis Qty: 1 DX: 174.9 Left mastectomy 1 each 0  . UNABLE TO FIND L8000 Surgical Prosthetic Bra Qty: 6 DX: 174.9 6 each   . vitamin B-12 (CYANOCOBALAMIN) 1000 MCG tablet Take 1,000 mcg by mouth daily.     No current facility-administered medications for this visit.    PHYSICAL EXAMINATION: ECOG PERFORMANCE STATUS: 0 - Asymptomatic  Filed Vitals:   12/08/14 1352  BP: 139/68  Pulse: 71  Temp: 98.2 F (36.8 C)  Resp: 18  Filed Weights   12/08/14 1352  Weight: 157 lb 9.6 oz (71.487 kg)    GENERAL:alert, no distress and comfortable SKIN: skin color, texture, turgor are normal, no rashes or significant lesions EYES: normal, Conjunctiva are pink and non-injected, sclera clear OROPHARYNX:no exudate, no erythema and lips, buccal mucosa, and tongue normal  NECK:  supple, thyroid normal size, non-tender, without nodularity LYMPH:  no palpable lymphadenopathy in the cervical, axillary or inguinal LUNGS: clear to auscultation and percussion with normal breathing effort HEART: regular rate & rhythm and no murmurs and no lower extremity edema ABDOMEN:abdomen soft, non-tender and normal bowel sounds Musculoskeletal:no cyanosis of digits and no clubbing  NEURO: alert & oriented x 3 with fluent speech, no focal motor/sensory deficits BREAST:no palpable masses other than surgical scar in the left breast. No palpable axillary supraclavicular or infraclavicular adenopathy no breast tenderness or nipple discharge.   LABORATORY DATA:  I have reviewed the data as listed   Chemistry      Component Value Date/Time   NA 137 12/08/2014 1320   NA 137 08/07/2013 0919   K 4.1 12/08/2014 1320   K 4.2 08/07/2013 0919   CL 99 08/07/2013 0919   CO2 27 12/08/2014 1320   CO2 26 08/07/2013 0919   BUN 22.4 12/08/2014 1320   BUN 26* 08/07/2013 0919   CREATININE 1.2* 12/08/2014 1320   CREATININE 1.31* 08/07/2013 0919      Component Value Date/Time   CALCIUM 9.5 12/08/2014 1320   CALCIUM 9.9 08/07/2013 0919   ALKPHOS 71 12/08/2014 1320   AST 25 12/08/2014 1320   ALT 22 12/08/2014 1320   BILITOT 0.52 12/08/2014 1320       Lab Results  Component Value Date   WBC 5.4 12/08/2014   HGB 13.4 12/08/2014   HCT 41.8 12/08/2014   MCV 94.1 12/08/2014   PLT 228 12/08/2014   NEUTROABS 3.4 12/08/2014   ASSESSMENT & PLAN:  Breast cancer of upper-inner quadrant of left female breast Recurrent left breast cancer original diagnosis in 2000 treated with lumpectomy and radiation could not tolerate antiestrogen therapy relapsed disease 2014 treated with lumpectomy (declined mastectomy) and currently on antiestrogen therapy with letrozole 2.5 mg daily since October 2014  Letrozole toxicities:  Breast cancer surveillance: 1. Mammograms will be scheduled for next week patient  has not had a mammogram since 2014 2. Breast exam 12/08/2014 is normal  Survivorship:Discussed the importance of physical exercise in decreasing the likelihood of breast cancer recurrence. Recommended 30 mins daily 6 days a week of either brisk walking or cycling or swimming. Encouraged patient to eat more fruits and vegetables and decrease red meat.   Patient's sister was diagnosed with breast cancer as well as her niece. She tells me that they're getting genetically tested. If they are found to have a mutation then I will send her to genetic counseling to get her tested as well. Return to clinic in 6 months for followup. Patient plans to drive to [place] for the winter.    Orders Placed This Encounter  Procedures  . MM Digital Diagnostic Bilat    Standing Status: Future     Number of Occurrences:      Standing Expiration Date: 12/08/2015    Order Specific Question:  Reason for Exam (SYMPTOM  OR DIAGNOSIS REQUIRED)    Answer:  annual follow up of H/O breast cancer    Order Specific Question:  Preferred imaging location?    Answer:  [facility]   The patient has a  good understanding of the overall plan. she agrees with it. She will call with any problems that may develop before her next visit here.   Rulon Eisenmenger, MD _0 (<PARAMETER> error)@ 2:21 PM

## 2014-12-08 NOTE — Assessment & Plan Note (Addendum)
Recurrent left breast cancer original diagnosis in 2000 treated with lumpectomy and radiation could not tolerate antiestrogen therapy relapsed disease 2014 treated with lumpectomy (declined mastectomy) and currently on antiestrogen therapy with letrozole 2.5 mg daily since October 2014  Letrozole toxicities:  Breast cancer surveillance: 1. Mammograms will be scheduled for next week patient has not had a mammogram since 2014 2. Breast exam 12/08/2014 is normal  Survivorship:Discussed the importance of physical exercise in decreasing the likelihood of breast cancer recurrence. Recommended 30 mins daily 6 days a week of either brisk walking or cycling or swimming. Encouraged patient to eat more fruits and vegetables and decrease red meat.   Patient's sister was diagnosed with breast cancer as well as her niece. She tells me that they're getting genetically tested. If they are found to have a mutation then I will send her to genetic counseling to get her tested as well. Return to clinic in 6 months for followup. Patient plans to drive to [place] for the winter.

## 2014-12-16 ENCOUNTER — Ambulatory Visit
Admission: RE | Admit: 2014-12-16 | Discharge: 2014-12-16 | Disposition: A | Discharge status: Home / Self Care | Payer: Medicare Other | Source: Ambulatory Visit | Attending: Hematology and Oncology | Admitting: Hematology and Oncology

## 2014-12-16 DIAGNOSIS — C50212 Malignant neoplasm of upper-inner quadrant of left female breast: Secondary | ICD-10-CM

## 2015-01-24 ENCOUNTER — Other Ambulatory Visit: Payer: Self-pay | Admitting: *Deleted

## 2015-01-24 DIAGNOSIS — C50912 Malignant neoplasm of unspecified site of left female breast: Secondary | ICD-10-CM

## 2015-01-24 MED ORDER — LETROZOLE 2.5 MG PO TABS: 2.5000 mg | ORAL_TABLET | Freq: Every day | ORAL | Status: DC

## 2015-01-24 NOTE — Telephone Encounter (Signed)
Call received from [facility] in [place], [place], patient on vacation requesting refill.  One order given due to patient at a temporary location.

## 2015-02-22 ENCOUNTER — Other Ambulatory Visit: Payer: Self-pay | Admitting: *Deleted

## 2015-02-22 DIAGNOSIS — C50912 Malignant neoplasm of unspecified site of left female breast: Secondary | ICD-10-CM

## 2015-02-22 MED ORDER — LETROZOLE 2.5 MG PO TABS: 2.5000 mg | ORAL_TABLET | Freq: Every day | ORAL | Status: DC

## 2015-06-08 NOTE — Assessment & Plan Note (Signed)
Recurrent left breast cancer original diagnosis in 2000 treated with lumpectomy and radiation could not tolerate antiestrogen therapy relapsed disease 2014 treated with lumpectomy (declined mastectomy) and currently on antiestrogen therapy with letrozole 2.5 mg daily since October 2014  Letrozole toxicities:  No major side effects   Breast cancer surveillance: 1. Mammograms 12/16/14: Normal Breast density Category B 2. Breast exam 06/09/2015 is normal  RTC in 6 months

## 2015-06-09 ENCOUNTER — Encounter: Payer: Self-pay | Admitting: Hematology and Oncology

## 2015-06-09 ENCOUNTER — Telehealth: Payer: Self-pay | Admitting: Hematology and Oncology

## 2015-06-09 ENCOUNTER — Ambulatory Visit (HOSPITAL_BASED_OUTPATIENT_CLINIC_OR_DEPARTMENT_OTHER): Payer: Medicare Other | Admitting: Hematology and Oncology

## 2015-06-09 VITALS — BP 151/52 | HR 65 | Temp 97.9°F | Resp 18 | Ht 62.0 in | Wt 155.8 lb

## 2015-06-09 DIAGNOSIS — C50212 Malignant neoplasm of upper-inner quadrant of left female breast: Secondary | ICD-10-CM | POA: Diagnosis not present

## 2015-06-09 DIAGNOSIS — Z170 Estrogen receptor positive status [ER+]: Secondary | ICD-10-CM | POA: Diagnosis not present

## 2015-06-09 DIAGNOSIS — Z79811 Long term (current) use of aromatase inhibitors: Secondary | ICD-10-CM

## 2015-06-09 NOTE — Telephone Encounter (Signed)
s.w. pt and advised on Jan appt.....pt ok and aware °

## 2015-06-09 NOTE — Progress Notes (Signed)
Patient Care Team: Provider Not In System as PCP - General  DIAGNOSIS: Breast cancer of upper-inner quadrant of left female breast   Staging form: Breast, AJCC 7th Edition     Clinical: T1a, N0, cM0 - Unsigned     Pathologic: Stage IB (T1a, N0, cM0) - Signed by [name], MD on 12/15/2013   SUMMARY OF ONCOLOGIC HISTORY:   Breast cancer of upper-inner quadrant of left female breast   12/08/1998 Initial Biopsy Left breast cancer: Lumpectomy followed by radiation T1 BN 0M0 could not tolerate tamoxifen or alternative therapies.   08/12/2013 Surgery Left breast lumpectomy: Tumor 1  invasive ductal carcinoma 0.4 cm with DCIS, tumor to: IDC 1.2 cm with DCIS positive margin, ER 98%, PR 14%, HER-2 negative, Ki-67 19% margins reexcision negative   09/07/2013 -  Anti-estrogen oral therapy Letrozole 2.5 mg daily plans for 5 years    CHIEF COMPLIANT: Follow-up of left breast cancer on letrozole  INTERVAL HISTORY: Misty Wilkerson is a 74 year old with above-mentioned history left breast cancer currently on letrozole. She has been doing extremely well on this treatment however over the past 2 weeks she had noticed increasing leg cramps and aches. She has been out in the summer walking lots of miles and staying very active. He denies taking any new medications. In fact her blood pressure medicine has been cut in half and her cholesterol is markedly improved as well. She does not take any cholesterol medication. She denies any new lumps or nodules in the breasts.  REVIEW OF SYSTEMS:   Constitutional: Denies fevers, chills or abnormal weight loss Eyes: Denies blurriness of vision Ears, nose, mouth, throat, and face: Denies mucositis or sore throat Respiratory: Denies cough, dyspnea or wheezes Cardiovascular: Denies palpitation, chest discomfort or lower extremity swelling Gastrointestinal:  Denies nausea, heartburn or change in bowel habits Skin: Denies abnormal skin rashes Lymphatics: Denies new  lymphadenopathy or easy bruising Neurological:Denies numbness, tingling or new weaknesses Behavioral/Psych: Mood is stable, no new changes  Breast:  denies any pain or lumps or nodules in either breasts All other systems were reviewed with the patient and are negative.  I have reviewed the past medical history, past surgical history, social history and family history with the patient and they are unchanged from previous note.  ALLERGIES:  is allergic to penicillins and tetracyclines & related.  MEDICATIONS:  Current Outpatient Prescriptions  Medication Sig Dispense Refill  . acetaminophen (TYLENOL) 500 MG tablet Take 1,000 mg by mouth daily as needed for pain.    . cholecalciferol (VITAMIN D) 400 UNITS TABS tablet Take 400 Units by mouth.    . letrozole (FEMARA) 2.5 MG tablet Take 1 tablet (2.5 mg total) by mouth daily. 90 tablet 3  . loratadine (CLARITIN) 10 MG tablet Take 10 mg by mouth daily.    . metoprolol succinate (TOPROL-XL) 50 MG 24 hr tablet Take 25 mg by mouth daily.     Marland Kitchen triamterene-hydrochlorothiazide (MAXZIDE-25) 37.5-25 MG per tablet Take 1 tablet by mouth daily.    Marland Kitchen UNABLE TO FIND RX: [id number] silicone breast prosthesis Qty: 1 DX: 174.9 Left mastectomy 1 each 0  . UNABLE TO FIND L8000 Surgical Prosthetic Bra Qty: 6 DX: 174.9 6 each   . vitamin B-12 (CYANOCOBALAMIN) 1000 MCG tablet Take 1,000 mcg by mouth daily.     No current facility-administered medications for this visit.    PHYSICAL EXAMINATION: ECOG PERFORMANCE STATUS: 1 - Symptomatic but completely ambulatory  Filed Vitals:   06/09/15 7824  BP: 151/52  Pulse: 65  Temp: 97.9 F (36.6 C)  Resp: 18   Filed Weights   06/09/15 0929  Weight: 155 lb 12.8 oz (70.67 kg)    GENERAL:alert, no distress and comfortable SKIN: skin color, texture, turgor are normal, no rashes or significant lesions EYES: normal, Conjunctiva are pink and non-injected, sclera clear OROPHARYNX:no exudate, no erythema and lips, buccal  mucosa, and tongue normal  NECK: supple, thyroid normal size, non-tender, without nodularity LYMPH:  no palpable lymphadenopathy in the cervical, axillary or inguinal LUNGS: clear to auscultation and percussion with normal breathing effort HEART: regular rate & rhythm and no murmurs and no lower extremity edema ABDOMEN:abdomen soft, non-tender and normal bowel sounds Musculoskeletal:no cyanosis of digits and no clubbing  NEURO: alert & oriented x 3 with fluent speech, no focal motor/sensory deficits BREAST: No palpable masses or nodules in either right or left breasts. Left breast has been palpated and no nodules are noted. No palpable axillary supraclavicular or infraclavicular adenopathy no breast tenderness or nipple discharge. (exam performed in the presence of a chaperone)  LABORATORY DATA:  I have reviewed the data as listed   Chemistry      Component Value Date/Time   NA 137 12/08/2014 1320   NA 137 08/07/2013 0919   K 4.1 12/08/2014 1320   K 4.2 08/07/2013 0919   CL 99 08/07/2013 0919   CO2 27 12/08/2014 1320   CO2 26 08/07/2013 0919   BUN 22.4 12/08/2014 1320   BUN 26* 08/07/2013 0919   CREATININE 1.2* 12/08/2014 1320   CREATININE 1.31* 08/07/2013 0919      Component Value Date/Time   CALCIUM 9.5 12/08/2014 1320   CALCIUM 9.9 08/07/2013 0919   ALKPHOS 71 12/08/2014 1320   AST 25 12/08/2014 1320   ALT 22 12/08/2014 1320   BILITOT 0.52 12/08/2014 1320       Lab Results  Component Value Date   WBC 5.4 12/08/2014   HGB 13.4 12/08/2014   HCT 41.8 12/08/2014   MCV 94.1 12/08/2014   PLT 228 12/08/2014   NEUTROABS 3.4 12/08/2014     RADIOGRAPHIC STUDIES: I have personally reviewed the radiology reports and agreed with their findings. Mammogram 12/16/2014 is normal  ASSESSMENT & PLAN:  Breast cancer of upper-inner quadrant of left female breast Recurrent left breast cancer original diagnosis in 2000 treated with lumpectomy and radiation could not tolerate  antiestrogen therapy relapsed disease 2014 treated with lumpectomy (declined mastectomy) and currently on antiestrogen therapy with letrozole 2.5 mg daily since October 2014  Letrozole toxicities:  No major side effects Patient complains of muscle cramps over the last 2 weeks which is suspected be related to dehydration from staying out in the sun for long periods of time and walking a lot of miles.   Breast cancer surveillance: 1. Mammograms 12/16/14: Normal Breast density Category B 2. Breast exam 06/09/2015 is normal  RTC in 6 months   No orders of the defined types were placed in this encounter.   The patient has a good understanding of the overall plan. she agrees with it. she will call with any problems that may develop before the next visit here.   Rulon Eisenmenger, MD

## 2015-08-18 ENCOUNTER — Encounter (INDEPENDENT_AMBULATORY_CARE_PROVIDER_SITE_OTHER): Payer: Self-pay

## 2015-08-18 ENCOUNTER — Other Ambulatory Visit (INDEPENDENT_AMBULATORY_CARE_PROVIDER_SITE_OTHER): Payer: Self-pay | Admitting: *Deleted

## 2015-08-18 DIAGNOSIS — Z1211 Encounter for screening for malignant neoplasm of colon: Secondary | ICD-10-CM

## 2015-09-07 ENCOUNTER — Telehealth (INDEPENDENT_AMBULATORY_CARE_PROVIDER_SITE_OTHER): Payer: Self-pay | Admitting: *Deleted

## 2015-09-07 DIAGNOSIS — Z1211 Encounter for screening for malignant neoplasm of colon: Secondary | ICD-10-CM

## 2015-09-07 NOTE — Telephone Encounter (Signed)
Patient needs suprep 

## 2015-09-08 MED ORDER — SUPREP BOWEL PREP KIT 17.5-3.13-1.6 GM/177ML PO SOLN: 1.0000 | Freq: Once | ORAL | Status: DC

## 2015-09-26 ENCOUNTER — Telehealth (INDEPENDENT_AMBULATORY_CARE_PROVIDER_SITE_OTHER): Payer: Self-pay | Admitting: *Deleted

## 2015-09-26 NOTE — Telephone Encounter (Signed)
agree

## 2015-09-26 NOTE — Telephone Encounter (Signed)
Referring MD/PCP: tapper   Procedure: tcs  Reason/Indication:  screening  Has patient had this procedure before?  Yes, 2006 -- scanned  If so, when, by whom and where?    Is there a family history of colon cancer?  no  Who?  What age when diagnosed?    Is patient diabetic?   no      Does patient have prosthetic heart valve or mechanical valve?  no  Do you have a pacemaker?  no  Has patient ever had endocarditis? no  Has patient had joint replacement within last 12 months?  no  Does patient tend to be constipated or take laxatives? no  Does patient have a history of alcohol/drug use?  no  Is patient on Coumadin, Plavix and/or Aspirin? no  Medications: see epic  Allergies: see epic  Medication Adjustment:   Procedure date & time: 10/26/15 at 1200

## 2015-10-26 ENCOUNTER — Encounter (HOSPITAL_COMMUNITY)
Admission: RE | Disposition: A | Discharge status: Home / Self Care | Payer: Self-pay | Source: Ambulatory Visit | Attending: Internal Medicine

## 2015-10-26 ENCOUNTER — Ambulatory Visit (HOSPITAL_COMMUNITY)
Admission: RE | Admit: 2015-10-26 | Discharge: 2015-10-26 | Disposition: A | Discharge status: Home / Self Care | Payer: Medicare Other | Source: Ambulatory Visit | Attending: Internal Medicine | Admitting: Internal Medicine

## 2015-10-26 ENCOUNTER — Encounter (HOSPITAL_COMMUNITY): Payer: Self-pay | Admitting: *Deleted

## 2015-10-26 DIAGNOSIS — D125 Benign neoplasm of sigmoid colon: Secondary | ICD-10-CM | POA: Diagnosis not present

## 2015-10-26 DIAGNOSIS — Z1211 Encounter for screening for malignant neoplasm of colon: Secondary | ICD-10-CM | POA: Diagnosis not present

## 2015-10-26 DIAGNOSIS — Z79899 Other long term (current) drug therapy: Secondary | ICD-10-CM | POA: Diagnosis not present

## 2015-10-26 DIAGNOSIS — K648 Other hemorrhoids: Secondary | ICD-10-CM | POA: Insufficient documentation

## 2015-10-26 DIAGNOSIS — Z880 Allergy status to penicillin: Secondary | ICD-10-CM | POA: Insufficient documentation

## 2015-10-26 DIAGNOSIS — Z9012 Acquired absence of left breast and nipple: Secondary | ICD-10-CM | POA: Diagnosis not present

## 2015-10-26 DIAGNOSIS — K649 Unspecified hemorrhoids: Secondary | ICD-10-CM | POA: Diagnosis not present

## 2015-10-26 DIAGNOSIS — D120 Benign neoplasm of cecum: Secondary | ICD-10-CM | POA: Diagnosis not present

## 2015-10-26 DIAGNOSIS — I10 Essential (primary) hypertension: Secondary | ICD-10-CM | POA: Insufficient documentation

## 2015-10-26 DIAGNOSIS — K644 Residual hemorrhoidal skin tags: Secondary | ICD-10-CM | POA: Insufficient documentation

## 2015-10-26 DIAGNOSIS — M1990 Unspecified osteoarthritis, unspecified site: Secondary | ICD-10-CM | POA: Diagnosis not present

## 2015-10-26 DIAGNOSIS — Z853 Personal history of malignant neoplasm of breast: Secondary | ICD-10-CM | POA: Insufficient documentation

## 2015-10-26 HISTORY — PX: COLONOSCOPY: SHX5424

## 2015-10-26 SURGERY — COLONOSCOPY
Anesthesia: Moderate Sedation

## 2015-10-26 MED ORDER — HYDROCORTISONE ACETATE 25 MG RE SUPP: 25.0000 mg | Freq: Every day | RECTAL | Status: DC

## 2015-10-26 MED ORDER — MEPERIDINE HCL 50 MG/ML IJ SOLN
INTRAMUSCULAR | Status: DC | PRN
  Administered 2015-10-26 (×2): 25 mg via INTRAVENOUS

## 2015-10-26 MED ORDER — MIDAZOLAM HCL 5 MG/5ML IJ SOLN
INTRAMUSCULAR | Status: AC
  Filled 2015-10-26: qty 10

## 2015-10-26 MED ORDER — MIDAZOLAM HCL 5 MG/5ML IJ SOLN
INTRAMUSCULAR | Status: DC | PRN
  Administered 2015-10-26 (×4): 2 mg via INTRAVENOUS

## 2015-10-26 MED ORDER — STERILE WATER FOR IRRIGATION IR SOLN
Status: DC | PRN
  Administered 2015-10-26: 12:00:00

## 2015-10-26 MED ORDER — MEPERIDINE HCL 50 MG/ML IJ SOLN
INTRAMUSCULAR | Status: AC
  Filled 2015-10-26: qty 1

## 2015-10-26 MED ORDER — SODIUM CHLORIDE 0.9 % IV SOLN
INTRAVENOUS | Status: DC
  Administered 2015-10-26: 11:00:00 via INTRAVENOUS

## 2015-10-26 NOTE — Discharge Instructions (Signed)
Resume usual medications and high fiber diet. Anusol HC suppository 1 per rectum daily at bedtime for 2 weeks. No driving for 24 hours. Physician will call with biopsy results.     Colonoscopy, Care After These instructions give you information on caring for yourself after your procedure. Your doctor may also give you more specific instructions. Call your doctor if you have any problems or questions after your procedure. HOME CARE  Do not drive for 24 hours.  Do not sign important papers or use machinery for 24 hours.  You may shower.  You may go back to your usual activities, but go slower for the first 24 hours.  Take rest breaks often during the first 24 hours.  Walk around or use warm packs on your belly (abdomen) if you have belly cramping or gas.  Drink enough fluids to keep your pee (urine) clear or pale yellow.  Resume your normal diet. Avoid heavy or fried foods.  Avoid drinking alcohol for 24 hours or as told by your doctor.  Only take medicines as told by your doctor. If a tissue sample (biopsy) was taken during the procedure:   Do not take aspirin or blood thinners for 7 days, or as told by your doctor.  Do not drink alcohol for 7 days, or as told by your doctor.  Eat soft foods for the first 24 hours. GET HELP IF: You still have a small amount of blood in your poop (stool) 2-3 days after the procedure. GET HELP RIGHT AWAY IF:  You have more than a small amount of blood in your poop.  You see clumps of tissue (blood clots) in your poop.  Your belly is puffy (swollen).  You feel sick to your stomach (nauseous) or throw up (vomit).  You have a fever.  You have belly pain that gets worse and medicine does not help. MAKE SURE YOU:  Understand these instructions.  Will watch your condition.  Will get help right away if you are not doing well or get worse.   This information is not intended to replace advice given to you by your health care provider.  Make sure you discuss any questions you have with your health care provider.   Document Released: 12/15/2010 Document Revised: 11/17/2013 Document Reviewed: 07/20/2013 Elsevier Interactive Patient Education 2016 [employer].  Hemorrhoids Hemorrhoids are swollen veins around the rectum or anus. There are two types of hemorrhoids:   Internal hemorrhoids. These occur in the veins just inside the rectum. They may poke through to the outside and become irritated and painful.  External hemorrhoids. These occur in the veins outside the anus and can be felt as a painful swelling or hard lump near the anus. CAUSES  Pregnancy.   Obesity.   Constipation or diarrhea.   Straining to have a bowel movement.   Sitting for long periods on the toilet.  Heavy lifting or other activity that caused you to strain.  Anal intercourse. SYMPTOMS   Pain.   Anal itching or irritation.   Rectal bleeding.   Fecal leakage.   Anal swelling.   One or more lumps around the anus.  DIAGNOSIS  Your caregiver may be able to diagnose hemorrhoids by visual examination. Other examinations or tests that may be performed include:   Examination of the rectal area with a gloved hand (digital rectal exam).   Examination of anal canal using a small tube (scope).   A blood test if you have lost a significant amount of  blood.  A test to look inside the colon (sigmoidoscopy or colonoscopy). TREATMENT Most hemorrhoids can be treated at home. However, if symptoms do not seem to be getting better or if you have a lot of rectal bleeding, your caregiver may perform a procedure to help make the hemorrhoids get smaller or remove them completely. Possible treatments include:   Placing a rubber band at the base of the hemorrhoid to cut off the circulation (rubber band ligation).   Injecting a chemical to shrink the hemorrhoid (sclerotherapy).   Using a tool to burn the hemorrhoid (infrared light  therapy).   Surgically removing the hemorrhoid (hemorrhoidectomy).   Stapling the hemorrhoid to block blood flow to the tissue (hemorrhoid stapling).  HOME CARE INSTRUCTIONS   Eat foods with fiber, such as whole grains, beans, nuts, fruits, and vegetables. Ask your doctor about taking products with added fiber in them (fibersupplements).  Increase fluid intake. Drink enough water and fluids to keep your urine clear or pale yellow.   Exercise regularly.   Go to the bathroom when you have the urge to have a bowel movement. Do not wait.   Avoid straining to have bowel movements.   Keep the anal area dry and clean. Use wet toilet paper or moist towelettes after a bowel movement.   Medicated creams and suppositories may be used or applied as directed.   Only take over-the-counter or prescription medicines as directed by your caregiver.   Take warm sitz baths for 15-20 minutes, 3-4 times a day to ease pain and discomfort.   Place ice packs on the hemorrhoids if they are tender and swollen. Using ice packs between sitz baths may be helpful.   Put ice in a plastic bag.   Place a towel between your skin and the bag.   Leave the ice on for 15-20 minutes, 3-4 times a day.   Do not use a donut-shaped pillow or sit on the toilet for long periods. This increases blood pooling and pain.  SEEK MEDICAL CARE IF:  You have increasing pain and swelling that is not controlled by treatment or medicine.  You have uncontrolled bleeding.  You have difficulty or you are unable to have a bowel movement.  You have pain or inflammation outside the area of the hemorrhoids. MAKE SURE YOU:  Understand these instructions.  Will watch your condition.  Will get help right away if you are not doing well or get worse.   This information is not intended to replace advice given to you by your health care provider. Make sure you discuss any questions you have with your health care  provider.   Document Released: 11/09/2000 Document Revised: 10/29/2012 Document Reviewed: 09/16/2012 Elsevier Interactive Patient Education 2016 Elsevier Inc.   Colon Polyps Polyps are lumps of extra tissue growing inside the body. Polyps can grow in the large intestine (colon). Most colon polyps are noncancerous (benign). However, some colon polyps can become cancerous over time. Polyps that are larger than a pea may be harmful. To be safe, caregivers remove and test all polyps. CAUSES  Polyps form when mutations in the genes cause your cells to grow and divide even though no more tissue is needed. RISK FACTORS There are a number of risk factors that can increase your chances of getting colon polyps. They include:  Being older than 50 years.  Family history of colon polyps or colon cancer.  Long-term colon diseases, such as colitis or Crohn disease.  Being overweight.  Smoking.  Being inactive.  Drinking too much alcohol. SYMPTOMS  Most small polyps do not cause symptoms. If symptoms are present, they may include:  Blood in the stool. The stool may look dark red or black.  Constipation or diarrhea that lasts longer than 1 week. DIAGNOSIS People often do not know they have polyps until their caregiver finds them during a regular checkup. Your caregiver can use 4 tests to check for polyps:  Digital rectal exam. The caregiver wears gloves and feels inside the rectum. This test would find polyps only in the rectum.  Barium enema. The caregiver puts a liquid called barium into your rectum before taking X-rays of your colon. Barium makes your colon look white. Polyps are dark, so they are easy to see in the X-ray pictures.  Sigmoidoscopy. A thin, flexible tube (sigmoidoscope) is placed into your rectum. The sigmoidoscope has a light and tiny camera in it. The caregiver uses the sigmoidoscope to look at the last third of your colon.  Colonoscopy. This test is like sigmoidoscopy,  but the caregiver looks at the entire colon. This is the most common method for finding and removing polyps. TREATMENT  Any polyps will be removed during a sigmoidoscopy or colonoscopy. The polyps are then tested for cancer. PREVENTION  To help lower your risk of getting more colon polyps:  Eat plenty of fruits and vegetables. Avoid eating fatty foods.  Do not smoke.  Avoid drinking alcohol.  Exercise every day.  Lose weight if recommended by your caregiver.  Eat plenty of calcium and folate. Foods that are rich in calcium include milk, cheese, and broccoli. Foods that are rich in folate include chickpeas, kidney beans, and spinach. HOME CARE INSTRUCTIONS Keep all follow-up appointments as directed by your caregiver. You may need periodic exams to check for polyps. SEEK MEDICAL CARE IF: You notice bleeding during a bowel movement.   This information is not intended to replace advice given to you by your health care provider. Make sure you discuss any questions you have with your health care provider.   Document Released: 08/08/2004 Document Revised: 12/03/2014 Document Reviewed: 01/22/2012 Elsevier Interactive Patient Education 2016 Elsevier Inc.  High-Fiber Diet Fiber, also called dietary fiber, is a type of carbohydrate found in fruits, vegetables, whole grains, and beans. A high-fiber diet can have many health benefits. Your health care provider may recommend a high-fiber diet to help:  Prevent constipation. Fiber can make your bowel movements more regular.  Lower your cholesterol.  Relieve hemorrhoids, uncomplicated diverticulosis, or irritable bowel syndrome.  Prevent overeating as part of a weight-loss plan.  Prevent heart disease, type 2 diabetes, and certain cancers. WHAT IS MY PLAN? The recommended daily intake of fiber includes:  38 grams for men under age 10.  5 grams for men over age 46.  34 grams for women under age 52.  50 grams for women over age  59. You can get the recommended daily intake of dietary fiber by eating a variety of fruits, vegetables, grains, and beans. Your health care provider may also recommend a fiber supplement if it is not possible to get enough fiber through your diet. WHAT DO I NEED TO KNOW ABOUT A HIGH-FIBER DIET?  Fiber supplements have not been widely studied for their effectiveness, so it is better to get fiber through food sources.  Always check the fiber content on thenutrition facts label of any prepackaged food. Look for foods that contain at least 5 grams of fiber per serving.  Ask your  dietitian if you have questions about specific foods that are related to your condition, especially if those foods are not listed in the following section.  Increase your daily fiber consumption gradually. Increasing your intake of dietary fiber too quickly may cause bloating, cramping, or gas.  Drink plenty of water. Water helps you to digest fiber. WHAT FOODS CAN I EAT? Grains Whole-grain breads. Multigrain cereal. Oats and oatmeal. Brown rice. Barley. Bulgur wheat. [place]. Bran muffins. Popcorn. Rye wafer crackers. Vegetables Sweet potatoes. Spinach. Kale. Artichokes. Cabbage. Broccoli. Green peas. Carrots. Squash. Fruits Berries. Pears. Apples. Oranges. Avocados. Prunes and raisins. Dried figs. Meats and Other Protein Sources [place], kidney, pinto, and soy beans. Split peas. Lentils. Nuts and seeds. Dairy Fiber-fortified yogurt. Beverages Fiber-fortified soy milk. Fiber-fortified orange juice. Other Fiber bars. The items listed above may not be a complete list of recommended foods or beverages. Contact your dietitian for more options. WHAT FOODS ARE NOT RECOMMENDED? Grains White bread. Pasta made with refined flour. White rice. Vegetables Fried potatoes. Canned vegetables. Well-cooked vegetables.  Fruits Fruit juice. Cooked, strained fruit. Meats and Other Protein Sources Fatty cuts of meat. Fried Sales executive  or fried fish. Dairy Milk. Yogurt. Cream cheese. Sour cream. Beverages Soft drinks. Other Cakes and pastries. Butter and oils. The items listed above may not be a complete list of foods and beverages to avoid. Contact your dietitian for more information. WHAT ARE SOME TIPS FOR INCLUDING HIGH-FIBER FOODS IN MY DIET?  Eat a wide variety of high-fiber foods.  Make sure that half of all grains consumed each day are whole grains.  Replace breads and cereals made from refined flour or white flour with whole-grain breads and cereals.  Replace white rice with brown rice, bulgur wheat, or millet.  Start the day with a breakfast that is high in fiber, such as a cereal that contains at least 5 grams of fiber per serving.  Use beans in place of meat in soups, salads, or pasta.  Eat high-fiber snacks, such as berries, raw vegetables, nuts, or popcorn.   This information is not intended to replace advice given to you by your health care provider. Make sure you discuss any questions you have with your health care provider.   Document Released: 11/12/2005 Document Revised: 12/03/2014 Document Reviewed: 04/27/2014 Elsevier Interactive Patient Education [employer].

## 2015-10-26 NOTE — H&P (Signed)
Misty Wilkerson is an 74 y.o. female.   Chief Complaint: Patient is here for colonoscopy. HPI: Patient is 74 year old Caucasian female who is here for screening colonoscopy. Last exam was interpreted 2006. She denies abdominal pain change in her bowel habits. She has intermittent hematochezia secondary to hemorrhoids. History significant for left breast carcinoma twice. She was issued diagnosis over 15 years ago and more recently over 2 years ago. She remains in remission. Family studies negative for CRC.  Past Medical History  Diagnosis Date  . Arthritis   . H/O hiatal hernia     eats slowly  now  . Hypertension   . Cancer [facility])     left breast    Past Surgical History  Procedure Laterality Date  . Breast lumpectomy    . Cholecystectomy    . Eye surgery      bilateral cataracts  . Partial mastectomy with needle localization and axillary sentinel lymph node bx Left 08/12/2013    Procedure: LEFT PARTIAL MASTECTOMY WITH NEEDLE LOCALIZATION ATTEMPTED AXILLARY SENTINEL LYMPH NODE BIOPSY;  Surgeon: Rolm Bookbinder, MD;  Location: [facility];  Service: General;  Laterality: Left;  . Re-excision of breast lumpectomy Left 08/27/2013    Procedure: RE-EXCISION OF BREAST LUMPECTOMY;  Surgeon: Rolm Bookbinder, MD;  Location: [facility];  Service: General;  Laterality: Left;    History reviewed. No pertinent family history. Social History:  reports that she quit smoking about 16 years ago. Her smoking use included Cigarettes. She has a 7.5 pack-year smoking history. She has never used smokeless tobacco. She reports that she drinks alcohol. She reports that she does not use illicit drugs.  Allergies:  Allergies  Allergen Reactions  . Penicillins Shortness Of Breath and Rash  . Tetracyclines & Related Itching    Medications Prior to Admission  Medication Sig Dispense Refill  . cholecalciferol (VITAMIN D) 400 UNITS TABS tablet Take 400 Units by mouth.    . letrozole (FEMARA) 2.5 MG tablet Take  1 tablet (2.5 mg total) by mouth daily. 90 tablet 3  . Maca Root POWD by Does not apply route.    . metoprolol succinate (TOPROL-XL) 50 MG 24 hr tablet Take 25 mg by mouth daily.     Marland Kitchen Spirulina 500 MG TABS Take by mouth.    Manus Gunning BOWEL PREP SOLN Take 1 kit by mouth once. 1 Bottle 0  . triamterene-hydrochlorothiazide (MAXZIDE-25) 37.5-25 MG per tablet Take 1 tablet by mouth daily.    . Turmeric POWD by Does not apply route.    . vitamin B-12 (CYANOCOBALAMIN) 1000 MCG tablet Take 1,000 mcg by mouth daily.      No results found for this or any previous visit (from the past 48 hour(s)). No results found.  ROS  Blood pressure 163/64, pulse 84, temperature 99.3 F (37.4 C), temperature source Oral, resp. rate 14, height _0  (1.6 m), weight 152 lb (68.947 kg), SpO2 100 %. Physical Exam  Constitutional: She appears well-developed and well-nourished.  HENT:  Mouth/Throat: Oropharynx is clear and moist.  Eyes: Conjunctivae are normal. No scleral icterus.  Neck: No thyromegaly present.  Cardiovascular: Normal rate, regular rhythm and normal heart sounds.   No murmur heard. Respiratory: Effort normal and breath sounds normal.  GI: Soft. She exhibits no distension and no mass. There is no tenderness.  Musculoskeletal: She exhibits no edema.  Lymphadenopathy:    She has no cervical adenopathy.  Neurological: She is alert.  Skin: Skin is warm and dry.  Assessment/Plan Average risk screening colonoscopy.  REHMAN,NAJEEB U 10/26/2015, 12:02 PM

## 2015-10-26 NOTE — Op Note (Signed)
COLONOSCOPY PROCEDURE REPORT  PATIENT:  Misty Wilkerson  MR#:  [id number] Birthdate:  May 20, 1941, 74 y.o., female Endoscopist:  Dr. Rogene Houston, MD Referred By:  Dr. Deloria Lair, MD Procedure Date: 10/26/2015  Procedure:   Colonoscopy  Indications: Patient is 74 year old Caucasian female was undergoing average risk screening colonoscopy. Last exam was normal and family 2006.  Informed Consent:  The procedure and risks were reviewed with the patient and informed consent was obtained.  Medications:  Demerol 50 mg IV Versed 8 mg IV  Description of procedure:  After a digital rectal exam was performed, that colonoscope was advanced from the anus through the rectum and colon to the area of the cecum, ileocecal valve and appendiceal orifice. The cecum was deeply intubated. These structures were well-seen and photographed for the record. From the level of the cecum and ileocecal valve, the scope was slowly and cautiously withdrawn. The mucosal surfaces were carefully surveyed utilizing scope tip to flexion to facilitate fold flattening as needed. The scope was pulled down into the rectum where a thorough exam including retroflexion was performed.  Findings:   Prep excellent. Small polyp cold snared from proximal sigmoid colon. Small polyp cold snared from distal sigmoid colon. These polyps were submitted together. Normal rectal mucosa. Hemorrhoids noted above and below the dentate line with surface erosion. Anal skin tags.   Therapeutic/Diagnostic Maneuvers Performed:  See above  Complications:  none  EBL: None  Cecal Withdrawal Time:  17 minutes  Impression:  Examination performed to cecum. Two small polyps cold snared from sigmoid colon and submitted together. Inflamed internal hemorrhoids. External hemorrhoids.  Recommendations:  Standard instructions given. Anusol HC suppository 1 per rectum daily at bedtime for 2 weeks. High-fiber diet. I will contact patient  with biopsy results and further recommendations.  REHMAN,NAJEEB U  10/26/2015 12:56 PM  CC: Dr. Deloria Lair, MD & Dr. Rayne Du ref. provider found

## 2015-10-28 ENCOUNTER — Encounter (HOSPITAL_COMMUNITY): Payer: Self-pay | Admitting: Internal Medicine

## 2015-11-07 ENCOUNTER — Other Ambulatory Visit: Payer: Self-pay | Admitting: Hematology and Oncology

## 2015-11-07 DIAGNOSIS — Z9889 Other specified postprocedural states: Secondary | ICD-10-CM

## 2015-11-07 DIAGNOSIS — C50912 Malignant neoplasm of unspecified site of left female breast: Secondary | ICD-10-CM

## 2015-12-07 NOTE — Assessment & Plan Note (Signed)
Recurrent left breast cancer original diagnosis in 2000 treated with lumpectomy and radiation could not tolerate antiestrogen therapy relapsed disease 2014 treated with lumpectomy (declined mastectomy) and currently on antiestrogen therapy with letrozole 2.5 mg daily since October 2014  Letrozole toxicities:  No major side effects Patient complains of muscle cramps over the last 2 weeks which is suspected be related to dehydration from staying out in the sun for long periods of time and walking a lot of miles.   Breast cancer surveillance: 1. Mammograms 12/16/14: Normal Breast density Category B 2. Breast exam 12/08/2015 is normal  RTC in 1 year for follow-up

## 2015-12-08 ENCOUNTER — Encounter: Payer: Self-pay | Admitting: Hematology and Oncology

## 2015-12-08 ENCOUNTER — Telehealth: Payer: Self-pay | Admitting: Hematology and Oncology

## 2015-12-08 ENCOUNTER — Ambulatory Visit (HOSPITAL_BASED_OUTPATIENT_CLINIC_OR_DEPARTMENT_OTHER): Payer: Medicare Other | Admitting: Hematology and Oncology

## 2015-12-08 VITALS — BP 137/56 | HR 65 | Temp 98.5°F | Resp 18 | Wt 157.1 lb

## 2015-12-08 DIAGNOSIS — C50212 Malignant neoplasm of upper-inner quadrant of left female breast: Secondary | ICD-10-CM

## 2015-12-08 DIAGNOSIS — Z79811 Long term (current) use of aromatase inhibitors: Secondary | ICD-10-CM | POA: Diagnosis not present

## 2015-12-08 DIAGNOSIS — Z170 Estrogen receptor positive status [ER+]: Secondary | ICD-10-CM

## 2015-12-08 DIAGNOSIS — Z853 Personal history of malignant neoplasm of breast: Secondary | ICD-10-CM | POA: Diagnosis not present

## 2015-12-08 DIAGNOSIS — C50912 Malignant neoplasm of unspecified site of left female breast: Secondary | ICD-10-CM

## 2015-12-08 NOTE — Addendum Note (Signed)
Addended by: Prentiss Bells on: 12/08/2015 12:15 PM   Modules accepted: Medications

## 2015-12-08 NOTE — Telephone Encounter (Signed)
Appointments made and avs printed for patient,a list of rheumatologist in [place] has been printed for patient to choose and call for hand arthritis

## 2015-12-08 NOTE — Progress Notes (Signed)
Patient Care Team: Zella Richer. Scotty Court, MD as PCP - General (Family Medicine)  DIAGNOSIS: Breast cancer of upper-inner quadrant of left female breast [facility])   Staging form: Breast, AJCC 7th Edition     Clinical: T1a, N0, cM0 - Unsigned     Pathologic: Stage IB (T1a, N0, cM0) - Signed by [name], MD on 12/15/2013   SUMMARY OF ONCOLOGIC HISTORY:   Breast cancer of upper-inner quadrant of left female breast ([place])   12/08/1998 Initial Biopsy Left breast cancer: Lumpectomy followed by radiation T1 BN 0M0 could not tolerate tamoxifen or alternative therapies.   08/12/2013 Surgery Left breast lumpectomy: Tumor 1  invasive ductal carcinoma 0.4 cm with DCIS, tumor to: IDC 1.2 cm with DCIS positive margin, ER 98%, PR 14%, HER-2 negative, Ki-67 19% margins reexcision negative   09/07/2013 -  Anti-estrogen oral therapy Letrozole 2.5 mg daily plans for 5 years    CHIEF COMPLIANT: complaining of knots on her fingers and arthritis in the shoulders  INTERVAL HISTORY: Misty Wilkerson is a 75 year old with above-mentioned history of left breast cancer currently on antiestrogen therapy with letrozole for the past 2 years. She appears to be tolerating letrozole fairly well. She complains of knots that have appeared on the dorsum of her digits as well as pain in both her shoulders making it difficult to elevate above the shoulder level. Otherwise she is doing quite well. She is due for a mammogram end of January.  REVIEW OF SYSTEMS:   Constitutional: Denies fevers, chills or abnormal weight loss Eyes: Denies blurriness of vision Ears, nose, mouth, throat, and face: Denies mucositis or sore throat Respiratory: Denies cough, dyspnea or wheezes Cardiovascular: Denies palpitation, chest discomfort Gastrointestinal:  Denies nausea, heartburn or change in bowel habits Skin: Denies abnormal skin rashes Lymphatics: Denies new lymphadenopathy or easy bruising Neurological:Denies numbness, tingling or new  weaknesses Behavioral/Psych: Mood is stable, no new changes  Extremities: arthritis in the fingers and shoulders Breast:  denies any pain or lumps or nodules in either breasts All other systems were reviewed with the patient and are negative.  I have reviewed the past medical history, past surgical history, social history and family history with the patient and they are unchanged from previous note.  ALLERGIES:  is allergic to penicillins and tetracyclines & related.  MEDICATIONS:  Current Outpatient Prescriptions  Medication Sig Dispense Refill  . cholecalciferol (VITAMIN D) 400 UNITS TABS tablet Take 400 Units by mouth.    . hydrocortisone (ANUSOL-HC) 25 MG suppository Place 1 suppository (25 mg total) rectally at bedtime. 14 suppository 0  . letrozole (FEMARA) 2.5 MG tablet Take 1 tablet (2.5 mg total) by mouth daily. 90 tablet 3  . Maca Root POWD by Does not apply route.    . metoprolol succinate (TOPROL-XL) 50 MG 24 hr tablet Take 25 mg by mouth daily.     Marland Kitchen Spirulina 500 MG TABS Take by mouth.    . triamterene-hydrochlorothiazide (MAXZIDE-25) 37.5-25 MG per tablet Take 1 tablet by mouth daily.    . Turmeric POWD by Does not apply route.    . vitamin B-12 (CYANOCOBALAMIN) 1000 MCG tablet Take 1,000 mcg by mouth daily.     No current facility-administered medications for this visit.    PHYSICAL EXAMINATION: ECOG PERFORMANCE STATUS: 1 - Symptomatic but completely ambulatory  Filed Vitals:   12/08/15 0912  BP: 137/56  Pulse: 65  Temp: 98.5 F (36.9 C)  Resp: 18   Filed Weights   12/08/15 0912  Weight: 157 lb 1.6 oz (71.26 kg)    GENERAL:alert, no distress and comfortable SKIN: skin color, texture, turgor are normal, no rashes or significant lesions EYES: normal, Conjunctiva are pink and non-injected, sclera clear OROPHARYNX:no exudate, no erythema and lips, buccal mucosa, and tongue normal  NECK: supple, thyroid normal size, non-tender, without nodularity LYMPH:  no  palpable lymphadenopathy in the cervical, axillary or inguinal LUNGS: clear to auscultation and percussion with normal breathing effort HEART: regular rate & rhythm and no murmurs and no lower extremity edema ABDOMEN:abdomen soft, non-tender and normal bowel sounds MUSCULOSKELETAL:no cyanosis of digits and no clubbing  NEURO: alert & oriented x 3 with fluent speech, no focal motor/sensory deficits EXTREMITIES: No lower extremity edema,knots (nodes) on the dorsum of the digits BREAST: No palpable masses or nodules in either right or left breasts. No palpable axillary supraclavicular or infraclavicular adenopathy no breast tenderness or nipple discharge. (exam performed in the presence of a chaperone)  LABORATORY DATA:  I have reviewed the data as listed   Chemistry      Component Value Date/Time   NA 137 12/08/2014 1320   NA 137 08/07/2013 0919   K 4.1 12/08/2014 1320   K 4.2 08/07/2013 0919   CL 99 08/07/2013 0919   CO2 27 12/08/2014 1320   CO2 26 08/07/2013 0919   BUN 22.4 12/08/2014 1320   BUN 26* 08/07/2013 0919   CREATININE 1.2* 12/08/2014 1320   CREATININE 1.31* 08/07/2013 0919      Component Value Date/Time   CALCIUM 9.5 12/08/2014 1320   CALCIUM 9.9 08/07/2013 0919   ALKPHOS 71 12/08/2014 1320   AST 25 12/08/2014 1320   ALT 22 12/08/2014 1320   BILITOT 0.52 12/08/2014 1320       Lab Results  Component Value Date   WBC 5.4 12/08/2014   HGB 13.4 12/08/2014   HCT 41.8 12/08/2014   MCV 94.1 12/08/2014   PLT 228 12/08/2014   NEUTROABS 3.4 12/08/2014     ASSESSMENT & PLAN:  Breast cancer of upper-inner quadrant of left female breast Recurrent left breast cancer original diagnosis in 2000 treated with lumpectomy and radiation could not tolerate antiestrogen therapy relapsed disease 2014 treated with lumpectomy (declined mastectomy) and currently on antiestrogen therapy with letrozole 2.5 mg daily since October 2014  Letrozole toxicities:  No major side  effects Patient complains of muscle cramps over the last 2 weeks which is suspected be related to dehydration from staying out in the sun for long periods of time and walking a lot of miles.   Breast cancer surveillance: 1. Mammograms 12/16/14: Normal Breast density Category B 2. Breast exam 12/08/2015 is normal  Arthritis in the fingers and shoulders: I suspect the patient has rheumatoid arthritis. I recommended a rheumatology evaluation.  RTC in 1 year for follow-up   No orders of the defined types were placed in this encounter.   The patient has a good understanding of the overall plan. she agrees with it. she will call with any problems that may develop before the next visit here.   Rulon Eisenmenger, MD 12/08/2015

## 2015-12-19 ENCOUNTER — Ambulatory Visit
Admission: RE | Admit: 2015-12-19 | Discharge: 2015-12-19 | Disposition: A | Discharge status: Home / Self Care | Payer: Medicare Other | Source: Ambulatory Visit | Attending: Hematology and Oncology | Admitting: Hematology and Oncology

## 2015-12-19 DIAGNOSIS — C50912 Malignant neoplasm of unspecified site of left female breast: Secondary | ICD-10-CM

## 2015-12-19 DIAGNOSIS — Z9889 Other specified postprocedural states: Secondary | ICD-10-CM

## 2016-02-10 ENCOUNTER — Other Ambulatory Visit: Payer: Self-pay | Admitting: Hematology and Oncology

## 2016-06-15 ENCOUNTER — Telehealth: Payer: Self-pay | Admitting: Hematology and Oncology

## 2016-06-15 NOTE — Telephone Encounter (Signed)
Called patient to confirm appointment. Left voice message. Appointment letter and schedule mailed. Maria F. °

## 2016-11-23 ENCOUNTER — Other Ambulatory Visit: Payer: Self-pay | Admitting: Hematology and Oncology

## 2016-11-23 DIAGNOSIS — Z853 Personal history of malignant neoplasm of breast: Secondary | ICD-10-CM

## 2016-12-04 ENCOUNTER — Telehealth: Payer: Self-pay | Admitting: *Deleted

## 2016-12-04 NOTE — Telephone Encounter (Signed)
Call from pt to confirm appointment. Informed her of 1/11 office visit. She was unaware that it had been moved.

## 2016-12-05 NOTE — Assessment & Plan Note (Signed)
Recurrent left breast cancer original diagnosis in 2000 treated with lumpectomy and radiation could not tolerate antiestrogen therapy relapsed disease 2014 treated with lumpectomy (declined mastectomy) and currently on antiestrogen therapy with letrozole 2.5 mg daily since October 2014  Letrozole toxicities:  No major side effects  Breast cancer surveillance: 1. Mammograms 12/19/15: Normal Breast density Category B 2. Breast exam 12/06/2016 is normal  Arthritis in the fingers and shoulders: I suspect the patient has rheumatoid arthritis. I recommended a rheumatology evaluation.  RTC in 1 year for follow-up

## 2016-12-06 ENCOUNTER — Encounter: Payer: Self-pay | Admitting: Hematology and Oncology

## 2016-12-06 ENCOUNTER — Ambulatory Visit (HOSPITAL_BASED_OUTPATIENT_CLINIC_OR_DEPARTMENT_OTHER): Payer: Medicare Other | Admitting: Hematology and Oncology

## 2016-12-06 DIAGNOSIS — C50212 Malignant neoplasm of upper-inner quadrant of left female breast: Secondary | ICD-10-CM | POA: Diagnosis not present

## 2016-12-06 DIAGNOSIS — Z170 Estrogen receptor positive status [ER+]: Secondary | ICD-10-CM

## 2016-12-06 DIAGNOSIS — Z79811 Long term (current) use of aromatase inhibitors: Secondary | ICD-10-CM | POA: Diagnosis not present

## 2016-12-06 MED ORDER — LETROZOLE 2.5 MG PO TABS: 2.5000 mg | ORAL_TABLET | Freq: Every day | ORAL | 3 refills | Status: DC

## 2016-12-06 NOTE — Progress Notes (Signed)
Patient Care Team: Zella Richer. Scotty Court, MD as PCP - General (Family Medicine)  DIAGNOSIS:  Encounter Diagnosis  Name Primary?  . Malignant neoplasm of upper-inner quadrant of left breast in female, estrogen receptor positive ([place])     SUMMARY OF ONCOLOGIC HISTORY:   Breast cancer of upper-inner quadrant of left female breast ([place])   12/08/1998 Initial Biopsy    Left breast cancer: Lumpectomy followed by radiation T1 BN 0M0 could not tolerate tamoxifen or alternative therapies.      08/12/2013 Surgery    Left breast lumpectomy: Tumor 1  invasive ductal carcinoma 0.4 cm with DCIS, tumor to: IDC 1.2 cm with DCIS positive margin, ER 98%, PR 14%, HER-2 negative, Ki-67 19% margins reexcision negative      09/07/2013 -  Anti-estrogen oral therapy    Letrozole 2.5 mg daily plans for 5 years       CHIEF COMPLIANT: Follow-up on letrozole  INTERVAL HISTORY: RUTHMARY OCCHIPINTI is a 76 year old with above-mentioned history of breast cancer treated with lumpectomy and is currently on letrozole therapy. She has been taking letrozole for over 3 years. She appears to be tolerating it extremely well. She denies any hot flashes. She denies any lumps or nodules in the breast. She has had pains in the joints which I felt could be related to rheumatoid arthritis.  REVIEW OF SYSTEMS:   Constitutional: Denies fevers, chills or abnormal weight loss Eyes: Denies blurriness of vision Ears, nose, mouth, throat, and face: Denies mucositis or sore throat Respiratory: Denies cough, dyspnea or wheezes Cardiovascular: Denies palpitation, chest discomfort Gastrointestinal:  Denies nausea, heartburn or change in bowel habits Skin: Denies abnormal skin rashes Lymphatics: Denies new lymphadenopathy or easy bruising Neurological:Denies numbness, tingling or new weaknesses Behavioral/Psych: Mood is stable, no new changes  Extremities: No lower extremity edema Breast:  denies any pain or lumps or nodules in either  breasts All other systems were reviewed with the patient and are negative.  I have reviewed the past medical history, past surgical history, social history and family history with the patient and they are unchanged from previous note.  ALLERGIES:  is allergic to penicillins and tetracyclines & related.  MEDICATIONS:  Current Outpatient Prescriptions  Medication Sig Dispense Refill  . cholecalciferol (VITAMIN D) 400 UNITS TABS tablet Take 400 Units by mouth.    . hydrocortisone (ANUSOL-HC) 25 MG suppository Place 1 suppository (25 mg total) rectally at bedtime. 14 suppository 0  . letrozole (FEMARA) 2.5 MG tablet TAKE 1 TABLET BY MOUTH EVERY DAY 90 tablet 3  . Maca Root POWD by Does not apply route.    . metoprolol succinate (TOPROL-XL) 50 MG 24 hr tablet Take 25 mg by mouth daily.     Marland Kitchen Spirulina 500 MG TABS Take by mouth.    . triamterene-hydrochlorothiazide (MAXZIDE-25) 37.5-25 MG per tablet Take 1 tablet by mouth daily.    . Turmeric POWD by Does not apply route.    . vitamin B-12 (CYANOCOBALAMIN) 1000 MCG tablet Take 1,000 mcg by mouth daily.     No current facility-administered medications for this visit.     PHYSICAL EXAMINATION: ECOG PERFORMANCE STATUS: 0 - Asymptomatic  Vitals:   12/06/16 0926  BP: (!) 141/62  Pulse: 66  Resp: 16  Temp: 97.4 F (36.3 C)   Filed Weights   12/06/16 0926  Weight: 161 lb (73 kg)    GENERAL:alert, no distress and comfortable SKIN: skin color, texture, turgor are normal, no rashes or significant lesions EYES: normal,  Conjunctiva are pink and non-injected, sclera clear OROPHARYNX:no exudate, no erythema and lips, buccal mucosa, and tongue normal  NECK: supple, thyroid normal size, non-tender, without nodularity LYMPH:  no palpable lymphadenopathy in the cervical, axillary or inguinal LUNGS: clear to auscultation and percussion with normal breathing effort HEART: regular rate & rhythm and no murmurs and no lower extremity  edema ABDOMEN:abdomen soft, non-tender and normal bowel sounds MUSCULOSKELETAL:no cyanosis of digits and no clubbing  NEURO: alert & oriented x 3 with fluent speech, no focal motor/sensory deficits EXTREMITIES: No lower extremity edema BREAST: No palpable masses or nodules in either right or left breasts. Scar tissue and tenderness in the left breast from prior surgery but no palpable nodules. (exam performed in the presence of a chaperone)  LABORATORY DATA:  I have reviewed the data as listed   Chemistry      Component Value Date/Time   NA 137 12/08/2014 1320   K 4.1 12/08/2014 1320   CL 99 08/07/2013 0919   CO2 27 12/08/2014 1320   BUN 22.4 12/08/2014 1320   CREATININE 1.2 (H) 12/08/2014 1320      Component Value Date/Time   CALCIUM 9.5 12/08/2014 1320   ALKPHOS 71 12/08/2014 1320   AST 25 12/08/2014 1320   ALT 22 12/08/2014 1320   BILITOT 0.52 12/08/2014 1320       Lab Results  Component Value Date   WBC 5.4 12/08/2014   HGB 13.4 12/08/2014   HCT 41.8 12/08/2014   MCV 94.1 12/08/2014   PLT 228 12/08/2014   NEUTROABS 3.4 12/08/2014    ASSESSMENT & PLAN:  Breast cancer of upper-inner quadrant of left female breast Recurrent left breast cancer original diagnosis in 2000 treated with lumpectomy and radiation could not tolerate antiestrogen therapy relapsed disease 2014 treated with lumpectomy (declined mastectomy) and currently on antiestrogen therapy with letrozole 2.5 mg daily since October 2014  Letrozole toxicities:  No major side effects  Breast cancer surveillance: 1. Mammograms 12/19/15: Normal Breast density Category B. Next mammogram is being scheduled for 02/18/2017. 2. Breast exam 12/06/2016 is normal 3. Bone density test will be ordered for March 2018 to be done at the same time as her mammograms.  Arthritis in the fingers and shoulders: I suspect the patient has rheumatoid arthritis. I recommended a rheumatology evaluation. She has not seen a  rheumatologist. She is planning on seeing a surgeon by name of Dr. Fredna Dow  RTC in 1 year for follow-up   I spent 15 minutes talking to the patient of which more than half was spent in counseling and coordination of care.  Orders Placed This Encounter  Procedures  . DG Bone Density    Standing Status:   Future    Standing Expiration Date:   12/06/2017    Scheduling Instructions:     Please schedule at the same time as mammograms    Order Specific Question:   Reason for Exam (SYMPTOM  OR DIAGNOSIS REQUIRED)    Answer:   Post menopausal on Letrozole    Order Specific Question:   Preferred imaging location?    Answer:   [facility]   The patient has a good understanding of the overall plan. she agrees with it. she will call with any problems that may develop before the next visit here.   Rulon Eisenmenger, MD 12/06/16

## 2016-12-07 ENCOUNTER — Ambulatory Visit: Payer: Medicare Other | Admitting: Hematology and Oncology

## 2017-02-18 ENCOUNTER — Ambulatory Visit
Admission: RE | Admit: 2017-02-18 | Discharge: 2017-02-18 | Disposition: A | Discharge status: Home / Self Care | Payer: Medicare Other | Source: Ambulatory Visit | Attending: Hematology and Oncology | Admitting: Hematology and Oncology

## 2017-02-18 DIAGNOSIS — Z170 Estrogen receptor positive status [ER+]: Principal | ICD-10-CM

## 2017-02-18 DIAGNOSIS — C50212 Malignant neoplasm of upper-inner quadrant of left female breast: Secondary | ICD-10-CM

## 2017-02-18 DIAGNOSIS — Z853 Personal history of malignant neoplasm of breast: Secondary | ICD-10-CM

## 2017-02-19 ENCOUNTER — Other Ambulatory Visit: Payer: Self-pay | Admitting: Emergency Medicine

## 2017-02-19 ENCOUNTER — Telehealth: Payer: Self-pay | Admitting: Hematology and Oncology

## 2017-02-19 NOTE — Telephone Encounter (Signed)
I called and discussed the result of the bone density which showed that the patient has osteoporosis with a T score of -2.5. I recommended bisphosphonate therapy. She does not want to oral Fosamax because of the concern for side effects. She will research about Prolia and inform us of her decision.

## 2017-02-19 NOTE — Telephone Encounter (Signed)
Patient called to return Dr Geralyn Flash call. Will advise MD of patient's call

## 2017-05-06 DIAGNOSIS — R52 Pain, unspecified: Secondary | ICD-10-CM | POA: Insufficient documentation

## 2017-05-06 DIAGNOSIS — M19042 Primary osteoarthritis, left hand: Secondary | ICD-10-CM | POA: Insufficient documentation

## 2017-05-06 DIAGNOSIS — IMO0002 Reserved for concepts with insufficient information to code with codable children: Secondary | ICD-10-CM | POA: Insufficient documentation

## 2017-11-15 ENCOUNTER — Telehealth: Payer: Self-pay | Admitting: Hematology and Oncology

## 2017-11-15 NOTE — Telephone Encounter (Signed)
Called patient she wanted to reschedule

## 2017-12-06 ENCOUNTER — Ambulatory Visit: Payer: Medicare Other | Admitting: Hematology and Oncology

## 2017-12-09 NOTE — Assessment & Plan Note (Signed)
Recurrent left breast cancer original diagnosis in 2000 treated with lumpectomy and radiation could not tolerate antiestrogen therapy relapsed disease 2014 treated with lumpectomy (declined mastectomy) and currently on antiestrogen therapy with letrozole 2.5 mg daily since October 2014  Letrozole toxicities:  No major side effects  Breast cancer surveillance: 1. Mammograms Jan 2018: Normal Breast density Category B 2. Breast exam 12/10/2017 is normal  Arthritis in the fingers and shoulders:  RTC in 1 year for follow-up

## 2017-12-10 ENCOUNTER — Inpatient Hospital Stay: Payer: Medicare Other | Attending: Hematology and Oncology | Admitting: Hematology and Oncology

## 2017-12-10 DIAGNOSIS — Z79811 Long term (current) use of aromatase inhibitors: Secondary | ICD-10-CM | POA: Diagnosis not present

## 2017-12-10 DIAGNOSIS — C50212 Malignant neoplasm of upper-inner quadrant of left female breast: Secondary | ICD-10-CM

## 2017-12-10 DIAGNOSIS — Z170 Estrogen receptor positive status [ER+]: Secondary | ICD-10-CM | POA: Diagnosis not present

## 2017-12-10 MED ORDER — LETROZOLE 2.5 MG PO TABS: 2.5000 mg | ORAL_TABLET | Freq: Every day | ORAL | 2 refills | Status: DC

## 2017-12-10 NOTE — Progress Notes (Signed)
Patient Care Team: Deloria Lair., MD as PCP - General (Family Medicine)  DIAGNOSIS:  Encounter Diagnosis  Name Primary?  . Malignant neoplasm of upper-inner quadrant of left breast in female, estrogen receptor positive ([place])     SUMMARY OF ONCOLOGIC HISTORY:   Breast cancer of upper-inner quadrant of left female breast ([place])   12/08/1998 Initial Biopsy    Left breast cancer: Lumpectomy followed by radiation T1 BN 0M0 could not tolerate tamoxifen or alternative therapies.      08/12/2013 Surgery    Left breast lumpectomy: Tumor 1  invasive ductal carcinoma 0.4 cm with DCIS, tumor to: IDC 1.2 cm with DCIS positive margin, ER 98%, PR 14%, HER-2 negative, Ki-67 19% margins reexcision negative      09/07/2013 -  Anti-estrogen oral therapy    Letrozole 2.5 mg daily plans for 5 years       CHIEF COMPLIANT: Follow-up on letrozole therapy  INTERVAL HISTORY: Misty Wilkerson is a 77 year old with above-mentioned left breast cancer treated with lumpectomy and is currently on letrozole.  She is tolerating letrozole reasonably well.  She cannot wait to complete 5 years and stopped taking letrozole.  She and her husband both stay physically active and are enjoying the life.  Denies any lumps or nodules in the breast.  REVIEW OF SYSTEMS:   Constitutional: Denies fevers, chills or abnormal weight loss Eyes: Denies blurriness of vision Ears, nose, mouth, throat, and face: Denies mucositis or sore throat Respiratory: Denies cough, dyspnea or wheezes Cardiovascular: Denies palpitation, chest discomfort Gastrointestinal:  Denies nausea, heartburn or change in bowel habits Skin: Denies abnormal skin rashes Lymphatics: Denies new lymphadenopathy or easy bruising Neurological:Denies numbness, tingling or new weaknesses Behavioral/Psych: Mood is stable, no new changes  Extremities: No lower extremity edema Breast:  denies any pain or lumps or nodules in either breasts All other systems  were reviewed with the patient and are negative.  I have reviewed the past medical history, past surgical history, social history and family history with the patient and they are unchanged from previous note.  ALLERGIES:  is allergic to penicillins and tetracyclines & related.  MEDICATIONS:  Current Outpatient Medications  Medication Sig Dispense Refill  . cholecalciferol (VITAMIN D) 400 UNITS TABS tablet Take 400 Units by mouth.    . hydrocortisone (ANUSOL-HC) 25 MG suppository Place 1 suppository (25 mg total) rectally at bedtime. 14 suppository 0  . letrozole (FEMARA) 2.5 MG tablet Take 1 tablet (2.5 mg total) by mouth daily. 90 tablet 3  . Maca Root POWD by Does not apply route.    . metoprolol succinate (TOPROL-XL) 50 MG 24 hr tablet Take 25 mg by mouth daily.     Marland Kitchen Spirulina 500 MG TABS Take by mouth.    . triamterene-hydrochlorothiazide (MAXZIDE-25) 37.5-25 MG per tablet Take 1 tablet by mouth daily.    . Turmeric POWD by Does not apply route.    . vitamin B-12 (CYANOCOBALAMIN) 1000 MCG tablet Take 1,000 mcg by mouth daily.     No current facility-administered medications for this visit.     PHYSICAL EXAMINATION: ECOG PERFORMANCE STATUS: 0 - Asymptomatic  Vitals:   12/10/17 0941  BP: (!) 152/62  Pulse: 71  Resp: 18  Temp: 97.9 F (36.6 C)  SpO2: 100%   Filed Weights   12/10/17 0941  Weight: 158 lb 4.8 oz (71.8 kg)    GENERAL:alert, no distress and comfortable SKIN: skin color, texture, turgor are normal, no rashes or significant lesions EYES:  normal, Conjunctiva are pink and non-injected, sclera clear OROPHARYNX:no exudate, no erythema and lips, buccal mucosa, and tongue normal  NECK: supple, thyroid normal size, non-tender, without nodularity LYMPH:  no palpable lymphadenopathy in the cervical, axillary or inguinal LUNGS: clear to auscultation and percussion with normal breathing effort HEART: regular rate & rhythm and no murmurs and no lower extremity  edema ABDOMEN:abdomen soft, non-tender and normal bowel sounds MUSCULOSKELETAL:no cyanosis of digits and no clubbing  NEURO: alert & oriented x 3 with fluent speech, no focal motor/sensory deficits EXTREMITIES: No lower extremity edema BREAST: No palpable masses or nodules in either right or left breasts. No palpable axillary supraclavicular or infraclavicular adenopathy no breast tenderness or nipple discharge. (exam performed in the presence of a chaperone)  LABORATORY DATA:  I have reviewed the data as listed CMP Latest Ref Rng & Units 12/08/2014 06/17/2014 12/15/2013  Glucose 70 - 140 mg/dl 91 94 99  BUN 7.0 - 26.0 mg/dL 22.4 32.0(H) 24.2  Creatinine 0.6 - 1.1 mg/dL 1.2(H) 1.4(H) 1.3(H)  Sodium 136 - 145 mEq/L 137 138 138  Potassium 3.5 - 5.1 mEq/L 4.1 4.4 4.2  Chloride 96 - 112 mEq/L - - -  CO2 22 - 29 mEq/L _0 Calcium 8.4 - 10.4 mg/dL 9.5 9.6 9.8  Total Protein 6.4 - 8.3 g/dL 7.8 7.4 7.6  Total Bilirubin 0.20 - 1.20 mg/dL 0.52 0.49 0.60  Alkaline Phos 40 - 150 U/L 71 64 63  AST 5 - 34 U/L _1 ALT 0 - 55 U/L _2 Lab Results  Component Value Date   WBC 5.4 12/08/2014   HGB 13.4 12/08/2014   HCT 41.8 12/08/2014   MCV 94.1 12/08/2014   PLT 228 12/08/2014   NEUTROABS 3.4 12/08/2014    ASSESSMENT & PLAN:  Breast cancer of upper-inner quadrant of left female breast Recurrent left breast cancer original diagnosis in 2000 treated with lumpectomy and radiation could not tolerate antiestrogen therapy relapsed disease 2014 treated with lumpectomy (declined mastectomy) and currently on antiestrogen therapy with letrozole 2.5 mg daily since October 2014  Letrozole toxicities:  No major side effects  Breast cancer surveillance: 1. Mammograms March 2018: Normal Breast density Category B 2. Breast exam 12/10/2017 is normal  Arthritis in the fingers and shoulders:  Patient will complete 5 years of antiestrogen therapy by October 2019.  She will stop taking  the antiestrogen therapy at that time.  She wishes to follow with her primary care physician and come to see [place] on an as-needed basis.   I spent 15 minutes talking to the patient of which more than half was spent in counseling and coordination of care.  No orders of the defined types were placed in this encounter.  The patient has a good understanding of the overall plan. she agrees with it. she will call with any problems that may develop before the next visit here.   Harriette Ohara, MD 12/10/17

## 2017-12-11 ENCOUNTER — Telehealth: Payer: Self-pay | Admitting: Hematology and Oncology

## 2017-12-11 NOTE — Telephone Encounter (Signed)
No 11/5 los.  °

## 2018-02-20 ENCOUNTER — Other Ambulatory Visit: Payer: Self-pay | Admitting: Hematology and Oncology

## 2018-02-20 DIAGNOSIS — Z139 Encounter for screening, unspecified: Secondary | ICD-10-CM

## 2018-02-25 ENCOUNTER — Other Ambulatory Visit: Payer: Self-pay | Admitting: Hematology and Oncology

## 2018-02-25 DIAGNOSIS — Z853 Personal history of malignant neoplasm of breast: Secondary | ICD-10-CM

## 2018-02-27 ENCOUNTER — Ambulatory Visit
Admission: RE | Admit: 2018-02-27 | Discharge: 2018-02-27 | Disposition: A | Discharge status: Home / Self Care | Payer: Medicare Other | Source: Ambulatory Visit | Attending: Hematology and Oncology | Admitting: Hematology and Oncology

## 2018-02-27 DIAGNOSIS — Z853 Personal history of malignant neoplasm of breast: Secondary | ICD-10-CM

## 2018-03-03 ENCOUNTER — Other Ambulatory Visit: Payer: Self-pay | Admitting: Orthopedic Surgery

## 2018-03-07 ENCOUNTER — Encounter (HOSPITAL_BASED_OUTPATIENT_CLINIC_OR_DEPARTMENT_OTHER): Payer: Self-pay | Admitting: *Deleted

## 2018-03-07 ENCOUNTER — Other Ambulatory Visit: Payer: Self-pay

## 2018-03-24 ENCOUNTER — Other Ambulatory Visit: Payer: Self-pay

## 2018-03-24 ENCOUNTER — Encounter (HOSPITAL_BASED_OUTPATIENT_CLINIC_OR_DEPARTMENT_OTHER)
Admission: RE | Admit: 2018-03-24 | Discharge: 2018-03-24 | Disposition: A | Discharge status: Home / Self Care | Payer: Medicare Other | Source: Ambulatory Visit | Attending: Orthopedic Surgery | Admitting: Orthopedic Surgery

## 2018-03-24 DIAGNOSIS — M19042 Primary osteoarthritis, left hand: Secondary | ICD-10-CM | POA: Diagnosis not present

## 2018-03-24 DIAGNOSIS — Z880 Allergy status to penicillin: Secondary | ICD-10-CM | POA: Diagnosis not present

## 2018-03-24 DIAGNOSIS — Z853 Personal history of malignant neoplasm of breast: Secondary | ICD-10-CM | POA: Diagnosis not present

## 2018-03-24 DIAGNOSIS — Z881 Allergy status to other antibiotic agents status: Secondary | ICD-10-CM | POA: Diagnosis not present

## 2018-03-24 DIAGNOSIS — M71342 Other bursal cyst, left hand: Secondary | ICD-10-CM | POA: Diagnosis present

## 2018-03-24 DIAGNOSIS — I10 Essential (primary) hypertension: Secondary | ICD-10-CM | POA: Diagnosis not present

## 2018-03-24 DIAGNOSIS — Z87891 Personal history of nicotine dependence: Secondary | ICD-10-CM | POA: Diagnosis not present

## 2018-03-24 DIAGNOSIS — M25742 Osteophyte, left hand: Secondary | ICD-10-CM | POA: Diagnosis not present

## 2018-03-24 LAB — BASIC METABOLIC PANEL
Anion gap: 8 (ref 5–15)
BUN: 21 mg/dL — ABNORMAL HIGH (ref 6–20)
CHLORIDE: 101 mmol/L (ref 101–111)
CO2: 27 mmol/L (ref 22–32)
Calcium: 9.4 mg/dL (ref 8.9–10.3)
Creatinine, Ser: 1.07 mg/dL — ABNORMAL HIGH (ref 0.44–1.00)
GFR calc non Af Amer: 49 mL/min — ABNORMAL LOW (ref >60)
GFR, EST AFRICAN AMERICAN: 57 mL/min — AB (ref >60)
Glucose, Bld: 93 mg/dL (ref 65–99)
POTASSIUM: 4.8 mmol/L (ref 3.5–5.1)
SODIUM: 136 mmol/L (ref 135–145)

## 2018-03-25 ENCOUNTER — Encounter (HOSPITAL_BASED_OUTPATIENT_CLINIC_OR_DEPARTMENT_OTHER)
Admission: RE | Disposition: A | Discharge status: Home / Self Care | Payer: Self-pay | Source: Ambulatory Visit | Attending: Orthopedic Surgery

## 2018-03-25 ENCOUNTER — Other Ambulatory Visit: Payer: Self-pay

## 2018-03-25 ENCOUNTER — Encounter (HOSPITAL_BASED_OUTPATIENT_CLINIC_OR_DEPARTMENT_OTHER): Payer: Self-pay | Admitting: *Deleted

## 2018-03-25 ENCOUNTER — Ambulatory Visit (HOSPITAL_BASED_OUTPATIENT_CLINIC_OR_DEPARTMENT_OTHER): Payer: Medicare Other | Admitting: Anesthesiology

## 2018-03-25 ENCOUNTER — Ambulatory Visit (HOSPITAL_BASED_OUTPATIENT_CLINIC_OR_DEPARTMENT_OTHER)
Admission: RE | Admit: 2018-03-25 | Discharge: 2018-03-25 | Disposition: A | Discharge status: Home / Self Care | Payer: Medicare Other | Source: Ambulatory Visit | Attending: Orthopedic Surgery | Admitting: Orthopedic Surgery

## 2018-03-25 DIAGNOSIS — Z881 Allergy status to other antibiotic agents status: Secondary | ICD-10-CM | POA: Insufficient documentation

## 2018-03-25 DIAGNOSIS — Z87891 Personal history of nicotine dependence: Secondary | ICD-10-CM | POA: Insufficient documentation

## 2018-03-25 DIAGNOSIS — M71342 Other bursal cyst, left hand: Secondary | ICD-10-CM | POA: Insufficient documentation

## 2018-03-25 DIAGNOSIS — I10 Essential (primary) hypertension: Secondary | ICD-10-CM | POA: Diagnosis not present

## 2018-03-25 DIAGNOSIS — Z880 Allergy status to penicillin: Secondary | ICD-10-CM | POA: Insufficient documentation

## 2018-03-25 DIAGNOSIS — M25742 Osteophyte, left hand: Secondary | ICD-10-CM | POA: Diagnosis not present

## 2018-03-25 DIAGNOSIS — Z853 Personal history of malignant neoplasm of breast: Secondary | ICD-10-CM | POA: Insufficient documentation

## 2018-03-25 DIAGNOSIS — M19042 Primary osteoarthritis, left hand: Secondary | ICD-10-CM | POA: Insufficient documentation

## 2018-03-25 HISTORY — PX: DEBRIDEMENT AND CLOSURE WOUND: SHX5614

## 2018-03-25 HISTORY — PX: EXCISION MASS UPPER EXTREMETIES: SHX6704

## 2018-03-25 SURGERY — EXCISION MASS UPPER EXTREMITIES
Anesthesia: Monitor Anesthesia Care | Site: Finger | Laterality: Left

## 2018-03-25 MED ORDER — HYDROCODONE-ACETAMINOPHEN 5-325 MG PO TABS: 1.0000 | ORAL_TABLET | Freq: Four times a day (QID) | ORAL | 0 refills | Status: DC | PRN

## 2018-03-25 MED ORDER — FENTANYL CITRATE (PF) 100 MCG/2ML IJ SOLN
INTRAMUSCULAR | Status: AC
  Filled 2018-03-25: qty 2

## 2018-03-25 MED ORDER — VANCOMYCIN HCL IN DEXTROSE 1-5 GM/200ML-% IV SOLN
INTRAVENOUS | Status: AC
  Filled 2018-03-25: qty 200

## 2018-03-25 MED ORDER — LIDOCAINE HCL (CARDIAC) PF 100 MG/5ML IV SOSY
PREFILLED_SYRINGE | INTRAVENOUS | Status: DC | PRN
  Administered 2018-03-25: 30 mg via INTRAVENOUS

## 2018-03-25 MED ORDER — ONDANSETRON HCL 4 MG/2ML IJ SOLN: 4.0000 mg | Freq: Once | INTRAMUSCULAR | Status: DC | PRN

## 2018-03-25 MED ORDER — FENTANYL CITRATE (PF) 100 MCG/2ML IJ SOLN
50.0000 ug | INTRAMUSCULAR | Status: DC | PRN
  Administered 2018-03-25: 50 ug via INTRAVENOUS

## 2018-03-25 MED ORDER — ONDANSETRON HCL 4 MG/2ML IJ SOLN
INTRAMUSCULAR | Status: DC | PRN
  Administered 2018-03-25: 4 mg via INTRAVENOUS

## 2018-03-25 MED ORDER — PROPOFOL 500 MG/50ML IV EMUL
INTRAVENOUS | Status: DC | PRN
  Administered 2018-03-25: 75 ug/kg/min via INTRAVENOUS

## 2018-03-25 MED ORDER — VANCOMYCIN HCL IN DEXTROSE 1-5 GM/200ML-% IV SOLN
1000.0000 mg | INTRAVENOUS | Status: AC
  Administered 2018-03-25: 1000 mg via INTRAVENOUS

## 2018-03-25 MED ORDER — SCOPOLAMINE 1 MG/3DAYS TD PT72: 1.0000 | MEDICATED_PATCH | Freq: Once | TRANSDERMAL | Status: DC | PRN

## 2018-03-25 MED ORDER — ACETAMINOPHEN 160 MG/5ML PO SOLN: 325.0000 mg | ORAL | Status: DC | PRN

## 2018-03-25 MED ORDER — MIDAZOLAM HCL 2 MG/2ML IJ SOLN: 1.0000 mg | INTRAMUSCULAR | Status: DC | PRN

## 2018-03-25 MED ORDER — CHLORHEXIDINE GLUCONATE 4 % EX LIQD: 60.0000 mL | Freq: Once | CUTANEOUS | Status: DC

## 2018-03-25 MED ORDER — OXYCODONE HCL 5 MG PO TABS: 5.0000 mg | ORAL_TABLET | Freq: Once | ORAL | Status: DC | PRN

## 2018-03-25 MED ORDER — ACETAMINOPHEN 325 MG PO TABS: 325.0000 mg | ORAL_TABLET | ORAL | Status: DC | PRN

## 2018-03-25 MED ORDER — BUPIVACAINE HCL (PF) 0.25 % IJ SOLN
INTRAMUSCULAR | Status: DC | PRN
  Administered 2018-03-25: 9 mL

## 2018-03-25 MED ORDER — LACTATED RINGERS IV SOLN
INTRAVENOUS | Status: DC
  Administered 2018-03-25: 10:00:00 via INTRAVENOUS

## 2018-03-25 MED ORDER — FENTANYL CITRATE (PF) 100 MCG/2ML IJ SOLN: 25.0000 ug | INTRAMUSCULAR | Status: DC | PRN

## 2018-03-25 MED ORDER — MEPERIDINE HCL 25 MG/ML IJ SOLN: 6.2500 mg | INTRAMUSCULAR | Status: DC | PRN

## 2018-03-25 MED ORDER — OXYCODONE HCL 5 MG/5ML PO SOLN: 5.0000 mg | Freq: Once | ORAL | Status: DC | PRN

## 2018-03-25 MED ORDER — LIDOCAINE HCL (PF) 0.5 % IJ SOLN
INTRAMUSCULAR | Status: DC | PRN
  Administered 2018-03-25: 30 mL via INTRAVENOUS

## 2018-03-25 SURGICAL SUPPLY — 50 items
BANDAGE COBAN STERILE 2 (GAUZE/BANDAGES/DRESSINGS) IMPLANT
BLADE SURG 15 STRL LF DISP TIS (BLADE) ×1 IMPLANT
BLADE SURG 15 STRL SS (BLADE) ×3
BNDG CMPR 9X4 STRL LF SNTH (GAUZE/BANDAGES/DRESSINGS)
BNDG COHESIVE 1X5 TAN STRL LF (GAUZE/BANDAGES/DRESSINGS) ×2 IMPLANT
BNDG COHESIVE 3X5 TAN STRL LF (GAUZE/BANDAGES/DRESSINGS) IMPLANT
BNDG ESMARK 4X9 LF (GAUZE/BANDAGES/DRESSINGS) IMPLANT
BNDG GAUZE ELAST 4 BULKY (GAUZE/BANDAGES/DRESSINGS) IMPLANT
CHLORAPREP W/TINT 26ML (MISCELLANEOUS) ×3 IMPLANT
CORD BIPOLAR FORCEPS 12FT (ELECTRODE) ×3 IMPLANT
COVER BACK TABLE 60X90IN (DRAPES) ×3 IMPLANT
COVER MAYO STAND STRL (DRAPES) ×3 IMPLANT
CUFF TOURNIQUET SINGLE 18IN (TOURNIQUET CUFF) ×2 IMPLANT
DECANTER SPIKE VIAL GLASS SM (MISCELLANEOUS) IMPLANT
DRAIN PENROSE 1/2X12 LTX STRL (WOUND CARE) IMPLANT
DRAPE EXTREMITY T 121X128X90 (DRAPE) ×3 IMPLANT
DRAPE SURG 17X23 STRL (DRAPES) ×3 IMPLANT
GAUZE SPONGE 4X4 12PLY STRL (GAUZE/BANDAGES/DRESSINGS) ×3 IMPLANT
GAUZE XEROFORM 1X8 LF (GAUZE/BANDAGES/DRESSINGS) ×3 IMPLANT
GLOVE BIO SURGEON STRL SZ 6.5 (GLOVE) ×1 IMPLANT
GLOVE BIO SURGEONS STRL SZ 6.5 (GLOVE) ×1
GLOVE BIOGEL PI IND STRL 7.0 (GLOVE) IMPLANT
GLOVE BIOGEL PI IND STRL 7.5 (GLOVE) IMPLANT
GLOVE BIOGEL PI IND STRL 8.5 (GLOVE) ×1 IMPLANT
GLOVE BIOGEL PI INDICATOR 7.0 (GLOVE) ×2
GLOVE BIOGEL PI INDICATOR 7.5 (GLOVE) ×2
GLOVE BIOGEL PI INDICATOR 8.5 (GLOVE) ×2
GLOVE SURG ORTHO 8.0 STRL STRW (GLOVE) ×3 IMPLANT
GLOVE SURG SS PI 7.5 STRL IVOR (GLOVE) ×2 IMPLANT
GOWN STRL REUS W/ TWL LRG LVL3 (GOWN DISPOSABLE) ×1 IMPLANT
GOWN STRL REUS W/TWL LRG LVL3 (GOWN DISPOSABLE) ×3
GOWN STRL REUS W/TWL XL LVL3 (GOWN DISPOSABLE) ×3 IMPLANT
NDL PRECISIONGLIDE 27X1.5 (NEEDLE) ×1 IMPLANT
NDL SAFETY ECLIPSE 18X1.5 (NEEDLE) IMPLANT
NEEDLE HYPO 18GX1.5 SHARP (NEEDLE)
NEEDLE PRECISIONGLIDE 27X1.5 (NEEDLE) ×3 IMPLANT
NS IRRIG 1000ML POUR BTL (IV SOLUTION) ×3 IMPLANT
PACK BASIN DAY SURGERY FS (CUSTOM PROCEDURE TRAY) ×3 IMPLANT
PAD CAST 3X4 CTTN HI CHSV (CAST SUPPLIES) IMPLANT
PADDING CAST COTTON 3X4 STRL (CAST SUPPLIES)
SPLINT FINGER 3.25 911903 (SOFTGOODS) ×2 IMPLANT
SPLINT PLASTER CAST XFAST 3X15 (CAST SUPPLIES) IMPLANT
SPLINT PLASTER XTRA FASTSET 3X (CAST SUPPLIES)
STOCKINETTE 4X48 STRL (DRAPES) ×3 IMPLANT
SUT ETHILON 4 0 PS 2 18 (SUTURE) ×3 IMPLANT
SUT VIC AB 4-0 P2 18 (SUTURE) IMPLANT
SYR BULB 3OZ (MISCELLANEOUS) ×3 IMPLANT
SYR CONTROL 10ML LL (SYRINGE) ×3 IMPLANT
TOWEL OR 17X24 6PK STRL BLUE (TOWEL DISPOSABLE) ×3 IMPLANT
UNDERPAD 30X30 (UNDERPADS AND DIAPERS) ×3 IMPLANT

## 2018-03-25 NOTE — Anesthesia Preprocedure Evaluation (Signed)
Anesthesia Evaluation  Patient identified by MRN, date of birth, ID band Patient awake    Reviewed: Allergy & Precautions, H&P , NPO status , Patient's Chart, lab work & pertinent test results  Airway Mallampati: II  TM Distance: >3 FB Neck ROM: Full    Dental no notable dental hx.    Pulmonary neg pulmonary ROS, former smoker,    Pulmonary exam normal breath sounds clear to auscultation       Cardiovascular hypertension, Pt. on medications and Pt. on home beta blockers negative cardio ROS Normal cardiovascular exam Rhythm:Regular Rate:Normal     Neuro/Psych negative neurological ROS  negative psych ROS   GI/Hepatic negative GI ROS, Neg liver ROS,   Endo/Other  negative endocrine ROS  Renal/GU negative Renal ROS  negative genitourinary   Musculoskeletal negative musculoskeletal ROS (+)   Abdominal   Peds negative pediatric ROS (+)  Hematology negative hematology ROS (+)   Anesthesia Other Findings   Reproductive/Obstetrics negative OB ROS                             Anesthesia Physical  Anesthesia Plan  ASA: II  Anesthesia Plan: Bier Block and MAC and Bier Block-LIDOCAINE ONLY   Post-op Pain Management:    Induction: Intravenous  PONV Risk Score and Plan: 2 and Treatment may vary due to age or medical condition  Airway Management Planned: Natural Airway and Nasal Cannula  Additional Equipment:   Intra-op Plan:   Post-operative Plan:   Informed Consent: I have reviewed the patients History and Physical, chart, labs and discussed the procedure including the risks, benefits and alternatives for the proposed anesthesia with the patient or authorized representative who has indicated his/her understanding and acceptance.   Dental advisory given  Plan Discussed with: CRNA, Anesthesiologist and Surgeon  Anesthesia Plan Comments:         Anesthesia Quick Evaluation

## 2018-03-25 NOTE — Anesthesia Procedure Notes (Signed)
Anesthesia Regional Block: Bier block (IV Regional)   Pre-Anesthetic Checklist: ,, timeout performed, Correct Patient, Correct Site, Correct Laterality, Correct Procedure,, site marked, surgical consent,, at surgeon's request  Laterality: Left     Needles:  Injection technique: Single-shot  Needle Type: Other      Needle Gauge: 20     Additional Needles:   Procedures:,,,,, intact distal pulses, Esmarch exsanguination, single tourniquet utilized,  Narrative:   Performed by: Personally       

## 2018-03-25 NOTE — H&P (Signed)
Misty Wilkerson is an 77 y.o. female.   Chief Complaint: mass left index fingerHPI: Misty Wilkerson is a 77 year old right-hand-dominant female referred by Dr. Scotty Court for consultation regarding a mass in the PIP joint of her left index finger. She states this been present for approximately 2 months. She recalls no history of injuries. She states it was initially on the top of the fingers now on the radial side. She states that she does not have any discomfort with it unless she squeezes it and has a sharp pain with a VAS score 6/10. She complains of feeling of tightness to the joint. She recalls no specific history of injury. She has no history diabetes thyroid problems arthritis or gout. Family history is positive for each of these. She has been tested for diabetes. She is advised the importance of continued testing of that. Takes nothing seems to make it better or worse. She has not taken anything for it.      Past Medical History:  Diagnosis Date  . Arthritis   . Cancer ([place])    left breast  . H/O hiatal hernia    eats slowly  now  . Hypertension     Past Surgical History:  Procedure Laterality Date  . BREAST LUMPECTOMY    . CHOLECYSTECTOMY    . COLONOSCOPY N/A 10/26/2015   Procedure: COLONOSCOPY;  Surgeon: Rogene Houston, MD;  [facility];  Service: Endoscopy;  Laterality: N/A;  1200  . EYE SURGERY     bilateral cataracts  . PARTIAL MASTECTOMY WITH NEEDLE LOCALIZATION AND AXILLARY SENTINEL LYMPH NODE BX Left 08/12/2013   Procedure: LEFT PARTIAL MASTECTOMY WITH NEEDLE LOCALIZATION ATTEMPTED AXILLARY SENTINEL LYMPH NODE BIOPSY;  Surgeon: Rolm Bookbinder, MD;  Location: [facility];  Service: General;  Laterality: Left;  . RE-EXCISION OF BREAST LUMPECTOMY Left 08/27/2013   Procedure: RE-EXCISION OF BREAST LUMPECTOMY;  Surgeon: Rolm Bookbinder, MD;  [facility];  Service: General;  Laterality: Left;    Family History  Problem Relation Age of Onset  . Breast cancer Sister 74   . Breast cancer Other 8   Social History:  reports that she quit smoking about 18 years ago. Her smoking use included cigarettes. She has a 7.50 pack-year smoking history. She has never used smokeless tobacco. She reports that she drinks alcohol. She reports that she does not use drugs.  Allergies:  Allergies  Allergen Reactions  . Penicillins Shortness Of Breath and Rash  . Tetracyclines & Related Itching    No medications prior to admission.    Results for orders placed or performed during the hospital encounter of 03/25/18 (from the past 48 hour(s))  Basic metabolic panel     Status: Abnormal   Collection Time: 03/24/18  3:08 PM  Result Value Ref Range   Sodium 136 135 - 145 mmol/L   Potassium 4.8 3.5 - 5.1 mmol/L   Chloride 101 101 - 111 mmol/L   CO2 27 22 - 32 mmol/L   Glucose, Bld 93 65 - 99 mg/dL   BUN 21 (H) 6 - 20 mg/dL   Creatinine, Ser 1.07 (H) 0.44 - 1.00 mg/dL   Calcium 9.4 8.9 - 10.3 mg/dL   GFR calc non Af Amer 49 (L) >60 mL/min   GFR calc Af Amer 57 (L) >60 mL/min    Comment: (NOTE) The eGFR has been calculated using the CKD EPI equation. This calculation has not been validated in all clinical situations. eGFR's persistently <60 mL/min signify possible Chronic Kidney  Disease.    Anion gap 8 5 - 15    Comment: Performed at [facility] [address] [place], [place] [address]    No results found.   Pertinent items are noted in HPI.  Height 5' 0.25" (1.53 m), weight 69.4 kg (153 lb).  General appearance: alert, cooperative and appears stated age Head: Normocephalic, without obvious abnormality Neck: no JVD Resp: clear to auscultation bilaterally Cardio: regular rate and rhythm, S1, S2 normal, no murmur, click, rub or gallop GI: soft, non-tender; bowel sounds normal; no masses,  no organomegaly Extremities: mass left index finger Pulses: 2+ and symmetric Skin: Skin color, texture, turgor normal. No rashes or lesions Neurologic: Grossly  normal Incision/Wound: na  Assessment/Plan Diagnosis mucoid cyst degenerative arthritis PIP joint DIP joint left index finger     Plan: She would like to have this operated on at this time. We recommend excision of the mass with debridement of the PIP joint index finger left hand as an outpatient under regional anesthesia. Pre-peri-and postoperative course are discussed along with risk applications. Is aware there is no guarantee to the surgery the possibility of infection recurrence injury to arteries nerves tendons complete relief symptoms dystrophy possibility of stiffness of the joint. Questions are encouraged and answered to her satisfaction. She is scheduled for excision mass left index finger debridement PIP or DIP joint.      Catilyn Boggus R 03/25/2018, 8:27 AM

## 2018-03-25 NOTE — Anesthesia Procedure Notes (Signed)
Procedure Name: MAC Date/Time: 03/25/2018 10:44 AM Performed by: Signe Colt, CRNA Pre-anesthesia Checklist: Patient identified, Emergency Drugs available, Suction available, Patient being monitored and Timeout performed Patient Re-evaluated:Patient Re-evaluated prior to induction Oxygen Delivery Method: Simple face mask

## 2018-03-25 NOTE — Discharge Instructions (Addendum)

## 2018-03-25 NOTE — Op Note (Signed)
NAMESUMAYYAH, [name]            ACCOUNT NO.:  [id number]  MEDICAL RECORD NO.:  [id number]  LOCATION:                                 FACILITY:  PHYSICIAN:  Misty Wilkerson, M.D.            DATE OF BIRTH:  DATE OF PROCEDURE:  03/25/2018 DATE OF DISCHARGE:                              OPERATIVE REPORT   PREOPERATIVE DIAGNOSIS:  Mucoid cyst with degenerative arthritis, PIP joint, left index finger.  POSTOPERATIVE DIAGNOSIS:  Mucoid cyst with degenerative arthritis, PIP joint, left index finger.  OPERATION:  Excision of mucoid cyst with debridement, proximal interphalangeal joint, proximal phalanx osteophytes, left index finger.  SURGEON:  Misty Brod, MD.  ASSISTANT:  None.  ANESTHESIA:  Forearm IV regional with metacarpal block and IV sedation.  PLACE OF Wilkerson:  Misty Wilkerson.  HISTORY:  The patient is a 77 year old female with a history of swelling at the PIP joint of her left index finger.  The side reveals a large cyst which transilluminates.  She has arthritic changes to the proximal interphalangeal joint.  She is admitted for excision of the cyst and debridement of PIP joint.  Pre, peri, and postoperative courses have been discussed along with risks and complications.  She is aware there is no guarantee to the Wilkerson, the possibility of infection; recurrence of injury to arteries, nerves, tendons; incomplete relief of symptoms; dystrophy; the possibility of stiffness to the PIP joint.  In the preoperative area, the patient is seen, the extremity marked by both patient and surgeon, and antibiotic given.  DESCRIPTION OF PROCEDURE:  The patient was brought to the operating room, where a forearm-based IV regional anesthetic was carried out without difficulty.  She was prepped using ChloraPrep in supine position with the left arm free.  A 3-minute dry time was allowed.  Time-out taken, confirming the patient and procedure.  A metacarpal block was given with 0.25%  bupivacaine without epinephrine, approximately 6-7 mL was used.  A mid lateral incision was then made, carried down through subcutaneous tissue.  A large cystic mass was immediately encountered. The extensor tendon was protected.  The cyst deflated.  This allowed it to be excised with blunt sharp dissection.  The joint was opened.  The collateral ligaments were intact.  The terminal slip was maintained via the lateral bands along with the central slip attachment to the middle phalanx.  The joint was opened.  A small rongeur was then used to remove osteophytes from the proximal phalanx.  Specimen was sent to Pathology. The wound was copiously irrigated with saline.  The skin was then closed with interrupted 4-0 nylon sutures.  A sterile compressive dressing and splint to the index finger was applied.  On deflation of the tourniquet, all fingers immediately pinked. She was taken to the recovery room for observation in satisfactory condition.  She will be discharged home to return the [place] in 1 week, on Norco.  She will try Tylenol and ibuprofen first.  I will see her back again in 1 week for reexamination.          ______________________________ Misty Wilkerson, M.D.     GK/MEDQ  D:  03/25/2018  T:  03/25/2018  Job:  [id number]

## 2018-03-25 NOTE — Op Note (Signed)
Other Dictation: Dictation Number [id number]

## 2018-03-25 NOTE — Anesthesia Postprocedure Evaluation (Signed)
Anesthesia Post Note  Patient: Misty Wilkerson  Procedure(s) Performed: EXCISION MASS LEFT INDEX (Left Finger) DEBRIDEMENTINTERPHALANGEAL LEFT INDEX FINGER (Left Finger)     Patient location during evaluation: PACU Anesthesia Type: MAC Level of consciousness: awake and alert Pain management: pain level controlled Vital Signs Assessment: post-procedure vital signs reviewed and stable Respiratory status: spontaneous breathing, nonlabored ventilation, respiratory function stable and patient connected to nasal cannula oxygen Cardiovascular status: stable and blood pressure returned to baseline Postop Assessment: no apparent nausea or vomiting Anesthetic complications: no    Last Vitals:  Vitals:   03/25/18 1130 03/25/18 1145  BP: 140/63 (!) 114/55  Pulse: 65 63  Resp: 19 18  Temp:  37 C  SpO2: 99% 98%    Last Pain:  Vitals:   03/25/18 1145  TempSrc:   PainSc: 0-No pain                 Demaryius Imran

## 2018-03-25 NOTE — Transfer of Care (Signed)
Immediate Anesthesia Transfer of Care Note  Patient: Misty Wilkerson  Procedure(s) Performed: EXCISION MASS LEFT INDEX (Left Finger) DEBRIDEMENTINTERPHALANGEAL LEFT INDEX FINGER (Left Finger)  Patient Location: PACU  Anesthesia Type:Bier block  Level of Consciousness: awake, alert , oriented and patient cooperative  Airway & Oxygen Therapy: Patient Spontanous Breathing and Patient connected to face mask oxygen  Post-op Assessment: Report given to RN and Post -op Vital signs reviewed and stable  Post vital signs: Reviewed and stable  Last Vitals:  Vitals Value Taken Time  BP    Temp    Pulse 69 03/25/2018 11:13 AM  Resp    SpO2 98 % 03/25/2018 11:13 AM  Vitals shown include unvalidated device data.  Last Pain:  Vitals:   03/25/18 0959  TempSrc: Oral  PainSc: 0-No pain         Complications: No apparent anesthesia complications

## 2018-03-25 NOTE — Brief Op Note (Signed)
03/25/2018  11:15 AM  PATIENT:  Misty Wilkerson  77 y.o. female  PRE-OPERATIVE DIAGNOSIS:  MUCOID CYST LEFT INDEX PIP/DIP  POST-OPERATIVE DIAGNOSIS:  MUCOID CYST LEFT INDEX PIP/DIP  PROCEDURE:  Procedure(s): EXCISION MASS LEFT INDEX (Left) DEBRIDEMENTINTERPHALANGEAL LEFT INDEX FINGER (Left)  SURGEON:  Surgeon(s) and Role:    * Daryll Brod, MD - Primary  PHYSICIAN ASSISTANT:   ASSISTANTS: none   ANESTHESIA:   local, regional and IV sedation  EBL:  none   BLOOD ADMINISTERED:none  DRAINS: none   LOCAL MEDICATIONS USED:  BUPIVICAINE   SPECIMEN:  Excision  DISPOSITION OF SPECIMEN:  PATHOLOGY  COUNTS:  YES  TOURNIQUET:   Total Tourniquet Time Documented: Forearm (Left) - 29 minutes Total: Forearm (Left) - 29 minutes   DICTATION: .Other Dictation: Dictation Number [id number]  PLAN OF CARE: Discharge to home after PACU  PATIENT DISPOSITION:  PACU - hemodynamically stable.

## 2018-03-26 ENCOUNTER — Encounter (HOSPITAL_BASED_OUTPATIENT_CLINIC_OR_DEPARTMENT_OTHER): Payer: Self-pay | Admitting: Orthopedic Surgery

## 2018-10-21 ENCOUNTER — Other Ambulatory Visit: Payer: Self-pay | Admitting: Hematology and Oncology

## 2018-12-23 ENCOUNTER — Other Ambulatory Visit: Payer: Self-pay | Admitting: Family Medicine

## 2018-12-23 DIAGNOSIS — M810 Age-related osteoporosis without current pathological fracture: Secondary | ICD-10-CM

## 2019-01-30 ENCOUNTER — Other Ambulatory Visit: Payer: Self-pay | Admitting: Hematology and Oncology

## 2019-01-30 DIAGNOSIS — Z1231 Encounter for screening mammogram for malignant neoplasm of breast: Secondary | ICD-10-CM

## 2019-02-27 ENCOUNTER — Other Ambulatory Visit: Payer: Medicare Other

## 2019-03-02 ENCOUNTER — Ambulatory Visit: Payer: Medicare Other

## 2019-04-30 ENCOUNTER — Ambulatory Visit
Admission: RE | Admit: 2019-04-30 | Discharge: 2019-04-30 | Disposition: A | Discharge status: Home / Self Care | Payer: Medicare Other | Source: Ambulatory Visit | Attending: Hematology and Oncology | Admitting: Hematology and Oncology

## 2019-04-30 ENCOUNTER — Other Ambulatory Visit: Payer: Self-pay

## 2019-04-30 ENCOUNTER — Ambulatory Visit: Payer: Medicare Other

## 2019-04-30 ENCOUNTER — Ambulatory Visit
Admission: RE | Admit: 2019-04-30 | Discharge: 2019-04-30 | Disposition: A | Discharge status: Home / Self Care | Payer: Medicare Other | Source: Ambulatory Visit | Attending: Family Medicine | Admitting: Family Medicine

## 2019-04-30 DIAGNOSIS — M810 Age-related osteoporosis without current pathological fracture: Secondary | ICD-10-CM

## 2019-04-30 DIAGNOSIS — Z1231 Encounter for screening mammogram for malignant neoplasm of breast: Secondary | ICD-10-CM

## 2020-03-22 ENCOUNTER — Other Ambulatory Visit: Payer: Self-pay | Admitting: Hematology and Oncology

## 2020-03-22 ENCOUNTER — Other Ambulatory Visit: Payer: Self-pay | Admitting: Family Medicine

## 2020-03-22 DIAGNOSIS — Z1231 Encounter for screening mammogram for malignant neoplasm of breast: Secondary | ICD-10-CM

## 2020-05-02 ENCOUNTER — Other Ambulatory Visit: Payer: Self-pay

## 2020-05-02 ENCOUNTER — Other Ambulatory Visit: Payer: Self-pay | Admitting: Family Medicine

## 2020-05-02 ENCOUNTER — Ambulatory Visit
Admission: RE | Admit: 2020-05-02 | Discharge: 2020-05-02 | Disposition: A | Discharge status: Home / Self Care | Payer: Medicare PPO | Source: Ambulatory Visit | Attending: Family Medicine | Admitting: Family Medicine

## 2020-05-02 DIAGNOSIS — Z1231 Encounter for screening mammogram for malignant neoplasm of breast: Secondary | ICD-10-CM

## 2020-05-02 DIAGNOSIS — N644 Mastodynia: Secondary | ICD-10-CM

## 2020-05-05 ENCOUNTER — Ambulatory Visit
Admission: RE | Admit: 2020-05-05 | Discharge: 2020-05-05 | Disposition: A | Discharge status: Home / Self Care | Payer: Medicare PPO | Source: Ambulatory Visit | Attending: Family Medicine | Admitting: Family Medicine

## 2020-05-05 ENCOUNTER — Other Ambulatory Visit: Payer: Self-pay

## 2020-05-05 DIAGNOSIS — N644 Mastodynia: Secondary | ICD-10-CM | POA: Diagnosis not present

## 2020-05-05 DIAGNOSIS — R922 Inconclusive mammogram: Secondary | ICD-10-CM | POA: Diagnosis not present

## 2020-05-24 DIAGNOSIS — Z961 Presence of intraocular lens: Secondary | ICD-10-CM | POA: Diagnosis not present

## 2020-05-24 DIAGNOSIS — H52203 Unspecified astigmatism, bilateral: Secondary | ICD-10-CM | POA: Diagnosis not present

## 2020-05-24 DIAGNOSIS — H524 Presbyopia: Secondary | ICD-10-CM | POA: Diagnosis not present

## 2020-06-20 DIAGNOSIS — M25572 Pain in left ankle and joints of left foot: Secondary | ICD-10-CM | POA: Diagnosis not present

## 2020-10-25 DIAGNOSIS — Z13220 Encounter for screening for lipoid disorders: Secondary | ICD-10-CM | POA: Diagnosis not present

## 2020-10-25 DIAGNOSIS — I10 Essential (primary) hypertension: Secondary | ICD-10-CM | POA: Diagnosis not present

## 2020-10-25 DIAGNOSIS — Z0001 Encounter for general adult medical examination with abnormal findings: Secondary | ICD-10-CM | POA: Diagnosis not present

## 2020-10-25 DIAGNOSIS — R5383 Other fatigue: Secondary | ICD-10-CM | POA: Diagnosis not present

## 2020-10-28 DIAGNOSIS — N1832 Chronic kidney disease, stage 3b: Secondary | ICD-10-CM | POA: Diagnosis not present

## 2020-10-28 DIAGNOSIS — M25512 Pain in left shoulder: Secondary | ICD-10-CM | POA: Diagnosis not present

## 2020-10-28 DIAGNOSIS — Z6830 Body mass index (BMI) 30.0-30.9, adult: Secondary | ICD-10-CM | POA: Diagnosis not present

## 2020-10-28 DIAGNOSIS — Z0001 Encounter for general adult medical examination with abnormal findings: Secondary | ICD-10-CM | POA: Diagnosis not present

## 2020-10-28 DIAGNOSIS — M810 Age-related osteoporosis without current pathological fracture: Secondary | ICD-10-CM | POA: Diagnosis not present

## 2020-10-28 DIAGNOSIS — I10 Essential (primary) hypertension: Secondary | ICD-10-CM | POA: Diagnosis not present

## 2020-10-28 DIAGNOSIS — M19049 Primary osteoarthritis, unspecified hand: Secondary | ICD-10-CM | POA: Diagnosis not present

## 2020-11-04 DIAGNOSIS — M25612 Stiffness of left shoulder, not elsewhere classified: Secondary | ICD-10-CM | POA: Diagnosis not present

## 2020-11-04 DIAGNOSIS — M25512 Pain in left shoulder: Secondary | ICD-10-CM | POA: Diagnosis not present

## 2020-11-07 DIAGNOSIS — M25512 Pain in left shoulder: Secondary | ICD-10-CM | POA: Diagnosis not present

## 2020-11-07 DIAGNOSIS — M25612 Stiffness of left shoulder, not elsewhere classified: Secondary | ICD-10-CM | POA: Diagnosis not present

## 2020-11-14 DIAGNOSIS — M25512 Pain in left shoulder: Secondary | ICD-10-CM | POA: Diagnosis not present

## 2020-11-14 DIAGNOSIS — M25612 Stiffness of left shoulder, not elsewhere classified: Secondary | ICD-10-CM | POA: Diagnosis not present

## 2021-05-22 ENCOUNTER — Other Ambulatory Visit: Payer: Self-pay | Admitting: Family Medicine

## 2021-05-22 DIAGNOSIS — Z1231 Encounter for screening mammogram for malignant neoplasm of breast: Secondary | ICD-10-CM

## 2021-05-25 DIAGNOSIS — H52203 Unspecified astigmatism, bilateral: Secondary | ICD-10-CM | POA: Diagnosis not present

## 2021-05-25 DIAGNOSIS — H524 Presbyopia: Secondary | ICD-10-CM | POA: Diagnosis not present

## 2021-05-25 DIAGNOSIS — Z961 Presence of intraocular lens: Secondary | ICD-10-CM | POA: Diagnosis not present

## 2021-06-08 ENCOUNTER — Ambulatory Visit
Admission: RE | Admit: 2021-06-08 | Discharge: 2021-06-08 | Disposition: A | Discharge status: Home / Self Care | Payer: Medicare PPO | Source: Ambulatory Visit | Attending: Family Medicine | Admitting: Family Medicine

## 2021-06-08 ENCOUNTER — Other Ambulatory Visit: Payer: Self-pay

## 2021-06-08 DIAGNOSIS — Z1231 Encounter for screening mammogram for malignant neoplasm of breast: Secondary | ICD-10-CM

## 2021-08-15 DIAGNOSIS — Z23 Encounter for immunization: Secondary | ICD-10-CM | POA: Diagnosis not present

## 2021-09-19 ENCOUNTER — Emergency Department (HOSPITAL_COMMUNITY)
Admission: EM | Admit: 2021-09-19 | Discharge: 2021-09-19 | Disposition: A | Discharge status: Home / Self Care | Payer: Medicare PPO | Attending: Emergency Medicine | Admitting: Emergency Medicine

## 2021-09-19 ENCOUNTER — Other Ambulatory Visit: Payer: Self-pay

## 2021-09-19 ENCOUNTER — Encounter (HOSPITAL_COMMUNITY): Payer: Self-pay

## 2021-09-19 DIAGNOSIS — I10 Essential (primary) hypertension: Secondary | ICD-10-CM | POA: Insufficient documentation

## 2021-09-19 DIAGNOSIS — Z87891 Personal history of nicotine dependence: Secondary | ICD-10-CM | POA: Insufficient documentation

## 2021-09-19 DIAGNOSIS — Z853 Personal history of malignant neoplasm of breast: Secondary | ICD-10-CM | POA: Insufficient documentation

## 2021-09-19 DIAGNOSIS — Z79899 Other long term (current) drug therapy: Secondary | ICD-10-CM | POA: Diagnosis not present

## 2021-09-19 LAB — CBC WITH DIFFERENTIAL/PLATELET
Abs Immature Granulocytes: 0.01 10*3/uL (ref 0.00–0.07)
Basophils Absolute: 0 10*3/uL (ref 0.0–0.1)
Basophils Relative: 0 %
Eosinophils Absolute: 0.2 10*3/uL (ref 0.0–0.5)
Eosinophils Relative: 3 %
HCT: 35.6 % — ABNORMAL LOW (ref 36.0–46.0)
Hemoglobin: 12 g/dL (ref 12.0–15.0)
Immature Granulocytes: 0 %
Lymphocytes Relative: 19 %
Lymphs Abs: 1.1 10*3/uL (ref 0.7–4.0)
MCH: 32.3 pg (ref 26.0–34.0)
MCHC: 33.7 g/dL (ref 30.0–36.0)
MCV: 95.7 fL (ref 80.0–100.0)
Monocytes Absolute: 0.5 10*3/uL (ref 0.1–1.0)
Monocytes Relative: 8 %
Neutro Abs: 4.1 10*3/uL (ref 1.7–7.7)
Neutrophils Relative %: 70 %
Platelets: 193 10*3/uL (ref 150–400)
RBC: 3.72 MIL/uL — ABNORMAL LOW (ref 3.87–5.11)
RDW: 13 % (ref 11.5–15.5)
WBC: 5.8 10*3/uL (ref 4.0–10.5)
nRBC: 0 % (ref 0.0–0.2)

## 2021-09-19 LAB — BASIC METABOLIC PANEL
Anion gap: 10 (ref 5–15)
BUN: 20 mg/dL (ref 8–23)
CO2: 23 mmol/L (ref 22–32)
Calcium: 8.5 mg/dL — ABNORMAL LOW (ref 8.9–10.3)
Chloride: 100 mmol/L (ref 98–111)
Creatinine, Ser: 1.15 mg/dL — ABNORMAL HIGH (ref 0.44–1.00)
GFR, Estimated: 49 mL/min — ABNORMAL LOW (ref >60)
Glucose, Bld: 86 mg/dL (ref 70–99)
Potassium: 3.8 mmol/L (ref 3.5–5.1)
Sodium: 133 mmol/L — ABNORMAL LOW (ref 135–145)

## 2021-09-19 NOTE — Discharge Instructions (Signed)
Follow-up with your doctor next week for recheck 

## 2021-09-19 NOTE — ED Triage Notes (Addendum)
Pt reports her BP "has been up and down all week". Pt has not seen her PCP for this. Pt says she got a "tingling sensation on right side of neck" that started this afternoon and is not constant, "sensation is on and off" pt is ambulatory with steady gait, no facial droop, no abnormal speech, alert and oriented x 4.

## 2021-09-19 NOTE — ED Provider Notes (Signed)
[facility] EMERGENCY DEPARTMENT Provider Note   CSN: [id number] Arrival date & time: 09/19/21  1909     History Chief Complaint  Patient presents with   Hypertension    Misty Wilkerson is a 80 y.o. female.  Patient states her blood pressure has been elevated and she has been having numbness to the right side of her neck with twitching of the neck muscles today.  It lasted for about 5 seconds and goes away.  It is happened 10 times.  Patient has been under a lot of stress  The history is provided by the patient and medical records. No language interpreter was used.  Hypertension This is a chronic problem. The current episode started more than 1 week ago. The problem occurs constantly. The problem has not changed since onset.Pertinent negatives include no chest pain, no abdominal pain and no headaches. Nothing aggravates the symptoms. Nothing relieves the symptoms. She has tried nothing for the symptoms.      Past Medical History:  Diagnosis Date   Arthritis    Cancer ([place])    left breast   H/O hiatal hernia    eats slowly  now   Hypertension    Personal history of radiation therapy 2002   left breast    Patient Active Problem List   Diagnosis Date Noted   Breast cancer of upper-inner quadrant of left female breast ([place]) 09/01/2013    Past Surgical History:  Procedure Laterality Date   BREAST LUMPECTOMY Left 2002, 2014   CHOLECYSTECTOMY     COLONOSCOPY N/A 10/26/2015   Procedure: COLONOSCOPY;  Surgeon: Rogene Houston, MD;  [facility];  Service: Endoscopy;  Laterality: N/A;  [place] WOUND Left 03/25/2018   Procedure: DEBRIDEMENTINTERPHALANGEAL LEFT INDEX FINGER;  Surgeon: Daryll Brod, MD;  Location: [facility];  Service: Orthopedics;  Laterality: Left;   EXCISION MASS UPPER EXTREMETIES Left 03/25/2018   Procedure: EXCISION MASS LEFT INDEX;  Surgeon: Daryll Brod, MD;  Location: [facility];   Service: Orthopedics;  Laterality: Left;   EYE SURGERY     bilateral cataracts   PARTIAL MASTECTOMY WITH NEEDLE LOCALIZATION AND AXILLARY SENTINEL LYMPH NODE BX Left 08/12/2013   Procedure: LEFT PARTIAL MASTECTOMY WITH NEEDLE LOCALIZATION ATTEMPTED AXILLARY SENTINEL LYMPH NODE BIOPSY;  Surgeon: Rolm Bookbinder, MD;  Location: [place] [facility];  Service: General;  Laterality: Left;   RE-EXCISION OF BREAST LUMPECTOMY Left 08/27/2013   Procedure: RE-EXCISION OF BREAST LUMPECTOMY;  Surgeon: Rolm Bookbinder, MD;  [facility];  Service: General;  Laterality: Left;     OB History   No obstetric history on file.     Family History  Problem Relation Age of Onset   Breast cancer Sister 31   Breast cancer Sister     Social History   Tobacco Use   Smoking status: Former    Packs/day: 0.50    Years: 15.00    Pack years: 7.50    Types: Cigarettes    Quit date: 07/22/1999    Years since quitting: 22.1   Smokeless tobacco: Never  Substance Use Topics   Alcohol use: Yes    Comment: occasional-- beer once in awhile  and maybe  1 glass white wine   Drug use: No    Home Medications Prior to Admission medications   Medication Sig Start Date End Date Taking? Authorizing Provider  cholecalciferol (VITAMIN D) 400 UNITS TABS tablet Take 400 Units by mouth.    [provider]  HYDROcodone-acetaminophen (NORCO) 5-325 MG tablet Take 1 tablet by mouth every 6 (six) hours as needed. 03/25/18   Daryll Brod, MD  letrozole [facility]) 2.5 MG tablet TAKE ONE TABLET BY MOUTH DAILY 10/21/18   Nicholas Lose, MD  metoprolol succinate (TOPROL-XL) 50 MG 24 hr tablet Take 25 mg by mouth daily.  07/06/13   [provider]  triamterene-hydrochlorothiazide (MAXZIDE-25) 37.5-25 MG per tablet Take 1 tablet by mouth daily.    [provider]  vitamin B-12 (CYANOCOBALAMIN) 1000 MCG tablet Take 1,000 mcg by mouth daily.    [provider]    Allergies    Penicillins and Tetracyclines &  related  Review of Systems   Review of Systems  Constitutional:  Negative for appetite change and fatigue.  HENT:  Negative for congestion, ear discharge and sinus pressure.   Eyes:  Negative for discharge.  Respiratory:  Negative for cough.   Cardiovascular:  Negative for chest pain.  Gastrointestinal:  Negative for abdominal pain and diarrhea.  Genitourinary:  Negative for frequency and hematuria.  Musculoskeletal:  Negative for back pain.       Neck muscle twitching  Skin:  Negative for rash.  Neurological:  Negative for seizures and headaches.  Psychiatric/Behavioral:  Negative for hallucinations.    Physical Exam Updated Vital Signs BP (!) 145/59   Pulse 65   Temp 97.9 F (36.6 C) (Oral)   Resp 13   Ht 5' 1.5" (1.562 m)   Wt 71.2 kg   SpO2 100%   BMI 29.18 kg/m   Physical Exam Vitals and nursing note reviewed.  Constitutional:      Appearance: She is well-developed.  HENT:     Head: Normocephalic.     Nose: Nose normal.  Eyes:     General: No scleral icterus.    Conjunctiva/sclera: Conjunctivae normal.  Neck:     Thyroid: No thyromegaly.  Cardiovascular:     Rate and Rhythm: Normal rate and regular rhythm.     Heart sounds: No murmur heard.   No friction rub. No gallop.  Pulmonary:     Breath sounds: No stridor. No wheezing or rales.  Chest:     Chest wall: No tenderness.  Abdominal:     General: There is no distension.     Tenderness: There is no abdominal tenderness. There is no rebound.  Musculoskeletal:        General: Normal range of motion.     Cervical back: Neck supple.  Lymphadenopathy:     Cervical: No cervical adenopathy.  Skin:    Findings: No erythema or rash.  Neurological:     Mental Status: She is alert and oriented to person, place, and time.     Motor: No abnormal muscle tone.     Coordination: Coordination normal.  Psychiatric:        Behavior: Behavior normal.    ED Results / Procedures / Treatments   Labs (all labs  ordered are listed, but only abnormal results are displayed) Labs Reviewed  CBC WITH DIFFERENTIAL/PLATELET  BASIC METABOLIC PANEL    EKG None  Radiology No results found.  Procedures Procedures   Medications Ordered in ED Medications - No data to display  ED Course  I have reviewed the triage vital signs and the nursing notes.  Pertinent labs & imaging results that were available during my care of the patient were reviewed by me and considered in my medical decision making (see chart for details).  MDM Rules/Calculators/A&P                           Patient with neck muscle twitching.  And hypertension.  She will follow-up with her PCP Final Clinical Impression(s) / ED Diagnoses Final diagnoses:  None    Rx / DC Orders ED Discharge Orders     None        Milton Ferguson, MD 09/23/21 1146

## 2021-09-25 ENCOUNTER — Emergency Department (HOSPITAL_COMMUNITY)
Admission: EM | Admit: 2021-09-25 | Discharge: 2021-09-25 | Discharge status: Against Medical Advice | Payer: Medicare PPO

## 2021-09-25 DIAGNOSIS — R42 Dizziness and giddiness: Secondary | ICD-10-CM | POA: Diagnosis not present

## 2021-09-25 DIAGNOSIS — Z6830 Body mass index (BMI) 30.0-30.9, adult: Secondary | ICD-10-CM | POA: Diagnosis not present

## 2021-09-25 DIAGNOSIS — R519 Headache, unspecified: Secondary | ICD-10-CM | POA: Diagnosis not present

## 2021-09-30 ENCOUNTER — Encounter: Payer: Self-pay | Admitting: Family Medicine

## 2021-10-02 DIAGNOSIS — M1000 Idiopathic gout, unspecified site: Secondary | ICD-10-CM | POA: Diagnosis not present

## 2021-10-23 ENCOUNTER — Encounter: Payer: Self-pay | Admitting: Family Medicine

## 2021-10-23 ENCOUNTER — Ambulatory Visit: Payer: Medicare PPO | Admitting: Family Medicine

## 2021-10-23 VITALS — BP 153/66 | HR 75 | Temp 98.0°F | Ht 61.5 in | Wt 160.8 lb

## 2021-10-23 DIAGNOSIS — Z780 Asymptomatic menopausal state: Secondary | ICD-10-CM | POA: Diagnosis not present

## 2021-10-23 DIAGNOSIS — R42 Dizziness and giddiness: Secondary | ICD-10-CM

## 2021-10-23 DIAGNOSIS — R519 Headache, unspecified: Secondary | ICD-10-CM | POA: Diagnosis not present

## 2021-10-23 DIAGNOSIS — I10 Essential (primary) hypertension: Secondary | ICD-10-CM

## 2021-10-23 MED ORDER — VALSARTAN 160 MG PO TABS
160.0000 mg | ORAL_TABLET | Freq: Every day | ORAL | 2 refills | Status: DC
Start: 2021-10-23 — End: 2021-12-27

## 2021-10-23 MED ORDER — VITAMIN D 25 MCG (1000 UNIT) PO TABS: 2000.0000 [IU] | ORAL_TABLET | Freq: Every day | ORAL | Status: AC

## 2021-10-23 MED ORDER — METOPROLOL SUCCINATE ER 25 MG PO TB24: 12.5000 mg | ORAL_TABLET | Freq: Every day | ORAL | 0 refills | Status: DC

## 2021-10-23 MED ORDER — MOXIFLOXACIN HCL 400 MG PO TABS: 400.0000 mg | ORAL_TABLET | Freq: Every day | ORAL | 0 refills | Status: DC

## 2021-10-23 MED ORDER — PREDNISONE 10 MG PO TABS: ORAL_TABLET | ORAL | 0 refills | Status: DC

## 2021-10-23 MED ORDER — TRIAMTERENE-HCTZ 37.5-25 MG PO TABS: 0.5000 | ORAL_TABLET | Freq: Every day | ORAL | 1 refills | Status: DC

## 2021-10-23 NOTE — Progress Notes (Signed)
Subjective:  Patient ID: Misty Wilkerson, female    DOB: January 13, 1941  Age: 80 y.o. MRN: [id number]  CC: New Patient (Initial Visit)   HPI Misty Wilkerson presents for concerned about BP running high, then low. Wants meds reevaluated.  Vertigo onset one month ago. Ears felts stuffed with cotton. Worse with turning head. Family hx - sister had a brain aneurysm. Head won't clear. Eppley maneuver at home not effective.  Orthostatic when getting up.    Depression screen PHQ 2/9 10/23/2021  Decreased Interest 0  Down, Depressed, Hopeless 0  PHQ - 2 Score 0    History Misty Wilkerson has a past medical history of Arthritis, Cancer ([place]), H/O hiatal hernia, Hypertension, Personal history of radiation therapy (2002), and Vertigo.   She has a past surgical history that includes Cholecystectomy; Eye surgery; Partial mastectomy with needle localization and axillary sentinel lymph node bx (Left, 08/12/2013); Re-excision of breast lumpectomy (Left, 08/27/2013); Colonoscopy (N/A, 10/26/2015); Excision mass upper extremeties (Left, 03/25/2018); Debridement and closure wound (Left, 03/25/2018); and Breast lumpectomy (Left, 2002, 2014).   Her family history includes Aneurysm in her sister; Breast cancer in her sister; Breast cancer (age of onset: 31) in her sister; Cancer in her brother; Diabetes in her father; Hypertension in her mother; Stroke in her father.She reports that she quit smoking about 22 years ago. Her smoking use included cigarettes. She has a 7.50 pack-year smoking history. She has never used smokeless tobacco. She reports current alcohol use. She reports that she does not use drugs.    ROS Review of Systems  Objective:  BP (!) 153/66   Pulse 75   Temp 98 F (36.7 C)   Ht 5' 1.5" (1.562 m)   Wt 160 lb 12.8 oz (72.9 kg)   SpO2 97%   BMI 29.89 kg/m   BP Readings from Last 3 Encounters:  10/23/21 (!) 153/66  09/19/21 (!) 145/59  03/25/18 (!) 114/55    Wt Readings from Last 3  Encounters:  10/23/21 160 lb 12.8 oz (72.9 kg)  09/19/21 157 lb (71.2 kg)  03/25/18 158 lb 3.2 oz (71.8 kg)     Physical Exam    Assessment & Plan:   Misty Wilkerson was seen today for new patient (initial visit).  Diagnoses and all orders for this visit:  Postmenopause -     DG Bone Density; Future  Acute nonintractable headache, unspecified headache type -     MR BRAIN W WO CONTRAST; Future  Dizziness -     MR BRAIN W WO CONTRAST; Future  Vertigo  Essential hypertension  Other orders -     metoprolol succinate (TOPROL-XL) 25 MG 24 hr tablet; Take 0.5 tablets (12.5 mg total) by mouth daily for 10 days. Then discontinue -     triamterene-hydrochlorothiazide (MAXZIDE-25) 37.5-25 MG tablet; Take 0.5 tablets by mouth daily. -     valsartan (DIOVAN) 160 MG tablet; Take 1 tablet (160 mg total) by mouth daily. For blood pressure. -     predniSONE (DELTASONE) 10 MG tablet; Take 5 daily for 2 days followed by 4,3,2 and 1 for 2 days each. -     moxifloxacin (AVELOX) 400 MG tablet; Take 1 tablet (400 mg total) by mouth daily. Take all of these, for infection -     cholecalciferol (VITAMIN D3) 25 MCG (1000 UNIT) tablet; Take 2 tablets (2,000 Units total) by mouth daily.      I have discontinued Misty Wilkerson's HYDROcodone-acetaminophen and letrozole. I have also changed her metoprolol succinate,  triamterene-hydrochlorothiazide, and cholecalciferol. Additionally, I am having her start on valsartan, predniSONE, and moxifloxacin. Lastly, I am having her maintain her vitamin B-12.  Allergies as of 10/23/2021       Reactions   Penicillins Shortness Of Breath, Rash   Tetracyclines & Related Itching        Medication List        Accurate as of October 23, 2021 12:21 PM. If you have any questions, ask your nurse or doctor.          STOP taking these medications    HYDROcodone-acetaminophen 5-325 MG tablet Commonly known as: Norco Stopped by: Claretta Fraise, MD    letrozole 2.5 MG tablet Commonly known as: FEMARA Stopped by: Claretta Fraise, MD       TAKE these medications    cholecalciferol 25 MCG (1000 UNIT) tablet Commonly known as: VITAMIN D3 Take 2 tablets (2,000 Units total) by mouth daily. What changed:  medication strength how much to take when to take this Changed by: Claretta Fraise, MD   metoprolol succinate 25 MG 24 hr tablet Commonly known as: TOPROL-XL Take 0.5 tablets (12.5 mg total) by mouth daily for 10 days. Then discontinue What changed:  medication strength how much to take additional instructions Changed by: Claretta Fraise, MD   moxifloxacin 400 MG tablet Commonly known as: Avelox Take 1 tablet (400 mg total) by mouth daily. Take all of these, for infection Started by: Claretta Fraise, MD   predniSONE 10 MG tablet Commonly known as: DELTASONE Take 5 daily for 2 days followed by 4,3,2 and 1 for 2 days each. Started by: Claretta Fraise, MD   triamterene-hydrochlorothiazide 37.5-25 MG tablet Commonly known as: MAXZIDE-25 Take 0.5 tablets by mouth daily. What changed: how much to take Changed by: Claretta Fraise, MD   valsartan 160 MG tablet Commonly known as: DIOVAN Take 1 tablet (160 mg total) by mouth daily. For blood pressure. Started by: Claretta Fraise, MD   vitamin B-12 1000 MCG tablet Commonly known as: CYANOCOBALAMIN Take 1,000 mcg by mouth daily.         Follow-up: Return in about 6 weeks (around 12/04/2021).  Claretta Fraise, M.D.

## 2021-10-25 ENCOUNTER — Encounter: Payer: Self-pay | Admitting: Family Medicine

## 2021-10-31 ENCOUNTER — Other Ambulatory Visit (INDEPENDENT_AMBULATORY_CARE_PROVIDER_SITE_OTHER): Payer: Medicare PPO

## 2021-10-31 DIAGNOSIS — Z780 Asymptomatic menopausal state: Secondary | ICD-10-CM | POA: Diagnosis not present

## 2021-11-03 ENCOUNTER — Ambulatory Visit (HOSPITAL_COMMUNITY)
Admission: RE | Admit: 2021-11-03 | Discharge: 2021-11-03 | Disposition: A | Discharge status: Home / Self Care | Payer: Medicare PPO | Source: Ambulatory Visit | Attending: Family Medicine | Admitting: Family Medicine

## 2021-11-03 ENCOUNTER — Other Ambulatory Visit: Payer: Self-pay

## 2021-11-03 DIAGNOSIS — R42 Dizziness and giddiness: Secondary | ICD-10-CM | POA: Diagnosis not present

## 2021-11-03 DIAGNOSIS — M8589 Other specified disorders of bone density and structure, multiple sites: Secondary | ICD-10-CM | POA: Diagnosis not present

## 2021-11-03 DIAGNOSIS — Z780 Asymptomatic menopausal state: Secondary | ICD-10-CM | POA: Diagnosis not present

## 2021-11-03 DIAGNOSIS — R519 Headache, unspecified: Secondary | ICD-10-CM | POA: Diagnosis not present

## 2021-11-03 MED ORDER — GADOBUTROL 1 MMOL/ML IV SOLN
7.5000 mL | Freq: Once | INTRAVENOUS | Status: AC | PRN
  Administered 2021-11-03: 7.5 mL via INTRAVENOUS

## 2021-11-05 ENCOUNTER — Encounter: Payer: Self-pay | Admitting: Family Medicine

## 2021-11-06 ENCOUNTER — Other Ambulatory Visit: Payer: Self-pay | Admitting: Family Medicine

## 2021-11-06 DIAGNOSIS — J341 Cyst and mucocele of nose and nasal sinus: Secondary | ICD-10-CM

## 2021-11-06 NOTE — Progress Notes (Signed)
DEXA shows osteopenia. I recommend weekly fosamax. ?Nurse, if pt. Is agreeable, send in Fosamax 70 mg weekly, #13. ? ?Thanks, ?WS ?

## 2021-11-07 ENCOUNTER — Ambulatory Visit: Payer: Medicare PPO

## 2021-11-07 ENCOUNTER — Telehealth: Payer: Self-pay

## 2021-11-07 DIAGNOSIS — Z0130 Encounter for examination of blood pressure without abnormal findings: Secondary | ICD-10-CM

## 2021-11-07 MED ORDER — ALENDRONATE SODIUM 70 MG PO TABS
70.0000 mg | ORAL_TABLET | ORAL | 3 refills | Status: DC
Start: 2021-11-07 — End: 2022-01-02

## 2021-11-07 NOTE — Progress Notes (Signed)
Patient here today to have blood pressure checked.  Blood pressure is 129/61, pulse 76.

## 2021-11-07 NOTE — Telephone Encounter (Signed)
Patient is willing to start Fosamax, go ahead and send to pharmacy.

## 2021-11-16 DIAGNOSIS — J320 Chronic maxillary sinusitis: Secondary | ICD-10-CM | POA: Diagnosis not present

## 2021-11-16 DIAGNOSIS — H903 Sensorineural hearing loss, bilateral: Secondary | ICD-10-CM | POA: Diagnosis not present

## 2021-11-16 DIAGNOSIS — J310 Chronic rhinitis: Secondary | ICD-10-CM | POA: Diagnosis not present

## 2021-11-16 DIAGNOSIS — H6121 Impacted cerumen, right ear: Secondary | ICD-10-CM | POA: Diagnosis not present

## 2021-11-16 DIAGNOSIS — J343 Hypertrophy of nasal turbinates: Secondary | ICD-10-CM | POA: Diagnosis not present

## 2021-12-04 ENCOUNTER — Encounter: Payer: Self-pay | Admitting: Family Medicine

## 2021-12-04 ENCOUNTER — Ambulatory Visit: Payer: Medicare PPO | Admitting: Family Medicine

## 2021-12-04 VITALS — BP 123/55 | HR 69 | Temp 97.1°F | Ht 61.5 in | Wt 165.6 lb

## 2021-12-04 DIAGNOSIS — R5383 Other fatigue: Secondary | ICD-10-CM | POA: Diagnosis not present

## 2021-12-04 DIAGNOSIS — J341 Cyst and mucocele of nose and nasal sinus: Secondary | ICD-10-CM

## 2021-12-04 DIAGNOSIS — I10 Essential (primary) hypertension: Secondary | ICD-10-CM

## 2021-12-04 NOTE — Progress Notes (Signed)
Subjective:  Patient ID: Misty Wilkerson, female    DOB: 12-10-1940  Age: 81 y.o. MRN: [id number]  CC: Follow-up   HPI Misty Wilkerson presents for feeling light headed frequently. Home BP readings running 100/45. No more HA since taking the prednisone taper  Feels run down. Energy poor. No syncope or falling.ENT to remove her Mucoid cyst on Jan 30 at [facility].  Depression screen [facility] 2/9 12/04/2021 12/04/2021 10/23/2021  Decreased Interest 0 0 0  Down, Depressed, Hopeless 0 0 0  PHQ - 2 Score 0 0 0  Altered sleeping 0 - -  Tired, decreased energy 1 - -  Change in appetite 0 - -  Feeling bad or failure about yourself  0 - -  Trouble concentrating 0 - -  Moving slowly or fidgety/restless 0 - -  Suicidal thoughts 0 - -  PHQ-9 Score 1 - -  Difficult doing work/chores Somewhat difficult - -    History Misty Wilkerson has a past medical history of Arthritis, Cancer ([place]), H/O hiatal hernia, Hypertension, Personal history of radiation therapy (2002), and Vertigo.   She has a past surgical history that includes Cholecystectomy; Eye surgery; Partial mastectomy with needle localization and axillary sentinel lymph node bx (Left, 08/12/2013); Re-excision of breast lumpectomy (Left, 08/27/2013); Colonoscopy (N/A, 10/26/2015); Excision mass upper extremeties (Left, 03/25/2018); Debridement and closure wound (Left, 03/25/2018); and Breast lumpectomy (Left, 2002, 2014).   Her family history includes Aneurysm in her sister; Breast cancer in her sister; Breast cancer (age of onset: 56) in her sister; Cancer in her brother; Diabetes in her father; Hypertension in her mother; Stroke in her father.She reports that she quit smoking about 22 years ago. Her smoking use included cigarettes. She has a 7.50 pack-year smoking history. She has never used smokeless tobacco. She reports current alcohol use. She reports that she does not use drugs.    ROS Review of Systems  Constitutional: Negative.   HENT: Negative.     Eyes:  Negative for visual disturbance.  Respiratory:  Negative for shortness of breath.   Cardiovascular:  Negative for chest pain.  Gastrointestinal:  Negative for abdominal pain.  Musculoskeletal:  Negative for arthralgias.   Objective:  BP (!) 123/55    Pulse 69    Temp (!) 97.1 F (36.2 C)    Ht 5' 1.5" (1.562 m)    Wt 165 lb 9.6 oz (75.1 kg)    SpO2 99%    BMI 30.78 kg/m   BP Readings from Last 3 Encounters:  12/04/21 (!) 123/55  10/23/21 (!) 153/66  09/19/21 (!) 145/59    Wt Readings from Last 3 Encounters:  12/04/21 165 lb 9.6 oz (75.1 kg)  10/23/21 160 lb 12.8 oz (72.9 kg)  09/19/21 157 lb (71.2 kg)     Physical Exam Constitutional:      General: She is not in acute distress.    Appearance: She is well-developed.  Cardiovascular:     Rate and Rhythm: Normal rate and regular rhythm.  Pulmonary:     Breath sounds: Normal breath sounds.  Musculoskeletal:        General: Normal range of motion.  Skin:    General: Skin is warm and dry.  Neurological:     Mental Status: She is alert and oriented to person, place, and time.      Assessment & Plan:   Misty Wilkerson was seen today for follow-up.  Diagnoses and all orders for this visit:  Essential hypertension -     TSH +  free T4 -     CBC with Differential/Platelet -     CMP14+EGFR -     Lipid panel  Mucous retention cyst of maxillary sinus  Fatigue, unspecified type -     TSH + free T4 -     CBC with Differential/Platelet       I have discontinued Misty Wilkerson's metoprolol succinate, triamterene-hydrochlorothiazide, predniSONE, and moxifloxacin. I am also having her maintain her vitamin B-12, valsartan, cholecalciferol, and alendronate.  Allergies as of 12/04/2021       Reactions   Penicillins Shortness Of Breath, Rash   Tetracyclines & Related Itching        Medication List        Accurate as of December 04, 2021  8:28 PM. If you have any questions, ask your nurse or doctor.           STOP taking these medications    metoprolol succinate 25 MG 24 hr tablet Commonly known as: TOPROL-XL Stopped by: Claretta Fraise, MD   moxifloxacin 400 MG tablet Commonly known as: Avelox Stopped by: Claretta Fraise, MD   predniSONE 10 MG tablet Commonly known as: DELTASONE Stopped by: Claretta Fraise, MD   triamterene-hydrochlorothiazide 37.5-25 MG tablet Commonly known as: MAXZIDE-25 Stopped by: Claretta Fraise, MD       TAKE these medications    alendronate 70 MG tablet Commonly known as: FOSAMAX Take 1 tablet (70 mg total) by mouth every 7 (seven) days. Take with a full glass of water on an empty stomach.   cholecalciferol 25 MCG (1000 UNIT) tablet Commonly known as: VITAMIN D3 Take 2 tablets (2,000 Units total) by mouth daily.   valsartan 160 MG tablet Commonly known as: DIOVAN Take 1 tablet (160 mg total) by mouth daily. For blood pressure.   vitamin B-12 1000 MCG tablet Commonly known as: CYANOCOBALAMIN Take 1,000 mcg by mouth daily.         Follow-up: Return in about 3 months (around 03/04/2022).  Claretta Fraise, M.D.

## 2021-12-05 ENCOUNTER — Other Ambulatory Visit: Payer: Self-pay | Admitting: Otolaryngology

## 2021-12-05 LAB — CMP14+EGFR
ALT: 17 IU/L (ref 0–32)
AST: 23 IU/L (ref 0–40)
Albumin/Globulin Ratio: 1.6 (ref 1.2–2.2)
Albumin: 4.4 g/dL (ref 3.7–4.7)
Alkaline Phosphatase: 70 IU/L (ref 44–121)
BUN/Creatinine Ratio: 19 (ref 12–28)
BUN: 28 mg/dL — ABNORMAL HIGH (ref 8–27)
Bilirubin Total: 0.3 mg/dL (ref 0.0–1.2)
CO2: 23 mmol/L (ref 20–29)
Calcium: 9.5 mg/dL (ref 8.7–10.3)
Chloride: 103 mmol/L (ref 96–106)
Creatinine, Ser: 1.51 mg/dL — ABNORMAL HIGH (ref 0.57–1.00)
Globulin, Total: 2.7 g/dL (ref 1.5–4.5)
Glucose: 93 mg/dL (ref 70–99)
Potassium: 5.8 mmol/L — ABNORMAL HIGH (ref 3.5–5.2)
Sodium: 138 mmol/L (ref 134–144)
Total Protein: 7.1 g/dL (ref 6.0–8.5)
eGFR: 35 mL/min/{1.73_m2} — ABNORMAL LOW (ref >59)

## 2021-12-05 LAB — CBC WITH DIFFERENTIAL/PLATELET
Basophils Absolute: 0 10*3/uL (ref 0.0–0.2)
Basos: 0 %
EOS (ABSOLUTE): 0.2 10*3/uL (ref 0.0–0.4)
Eos: 3 %
Hematocrit: 40.7 % (ref 34.0–46.6)
Hemoglobin: 13.6 g/dL (ref 11.1–15.9)
Immature Grans (Abs): 0 10*3/uL (ref 0.0–0.1)
Immature Granulocytes: 0 %
Lymphocytes Absolute: 1 10*3/uL (ref 0.7–3.1)
Lymphs: 20 %
MCH: 31 pg (ref 26.6–33.0)
MCHC: 33.4 g/dL (ref 31.5–35.7)
MCV: 93 fL (ref 79–97)
Monocytes Absolute: 0.4 10*3/uL (ref 0.1–0.9)
Monocytes: 8 %
Neutrophils Absolute: 3.5 10*3/uL (ref 1.4–7.0)
Neutrophils: 69 %
Platelets: 239 10*3/uL (ref 150–450)
RBC: 4.39 x10E6/uL (ref 3.77–5.28)
RDW: 11.9 % (ref 11.7–15.4)
WBC: 5.2 10*3/uL (ref 3.4–10.8)

## 2021-12-05 LAB — LIPID PANEL
Chol/HDL Ratio: 4.1 ratio (ref 0.0–4.4)
Cholesterol, Total: 224 mg/dL — ABNORMAL HIGH (ref 100–199)
HDL: 54 mg/dL (ref >39)
LDL Chol Calc (NIH): 146 mg/dL — ABNORMAL HIGH (ref 0–99)
Triglycerides: 135 mg/dL (ref 0–149)
VLDL Cholesterol Cal: 24 mg/dL (ref 5–40)

## 2021-12-05 LAB — TSH+FREE T4
Free T4: 1.24 ng/dL (ref 0.82–1.77)
TSH: 1.76 u[IU]/mL (ref 0.450–4.500)

## 2021-12-06 ENCOUNTER — Ambulatory Visit (INDEPENDENT_AMBULATORY_CARE_PROVIDER_SITE_OTHER): Payer: Medicare PPO

## 2021-12-06 VITALS — Wt 158.0 lb

## 2021-12-06 DIAGNOSIS — E7800 Pure hypercholesterolemia, unspecified: Secondary | ICD-10-CM | POA: Insufficient documentation

## 2021-12-06 DIAGNOSIS — E559 Vitamin D deficiency, unspecified: Secondary | ICD-10-CM | POA: Insufficient documentation

## 2021-12-06 DIAGNOSIS — Z6830 Body mass index (BMI) 30.0-30.9, adult: Secondary | ICD-10-CM | POA: Insufficient documentation

## 2021-12-06 DIAGNOSIS — I10 Essential (primary) hypertension: Secondary | ICD-10-CM | POA: Insufficient documentation

## 2021-12-06 DIAGNOSIS — M810 Age-related osteoporosis without current pathological fracture: Secondary | ICD-10-CM | POA: Insufficient documentation

## 2021-12-06 DIAGNOSIS — Z853 Personal history of malignant neoplasm of breast: Secondary | ICD-10-CM | POA: Insufficient documentation

## 2021-12-06 DIAGNOSIS — Z0000 Encounter for general adult medical examination without abnormal findings: Secondary | ICD-10-CM

## 2021-12-06 DIAGNOSIS — Z86010 Personal history of colonic polyps: Secondary | ICD-10-CM | POA: Insufficient documentation

## 2021-12-06 NOTE — Patient Instructions (Signed)
Misty Wilkerson , Thank you for taking time to come for your Medicare Wellness Visit. I appreciate your ongoing commitment to your health goals. Please review the following plan we discussed and let me know if I can assist you in the future.   Screening recommendations/referrals: Colonoscopy: No longer required Mammogram: Done 06/08/2021 - Repeat annually  Bone Density: Done 11/03/2021 - Repeat every 2 years Recommended yearly ophthalmology/optometry visit for glaucoma screening and checkup Recommended yearly dental visit for hygiene and checkup  Vaccinations: Influenza vaccine: 08/14/21 - Repeat annually Pneumococcal vaccine: Done 09/26/2009 & 03/16/2014 Tdap vaccine: Due - Repeats every 10 years  Shingles vaccine: Done 06/19/2019 & 11/25/2019   Covid-19: Done 12/08/2019, 01/18/2020, 09/28/2020, & 08/15/2021  Advanced directives: Advance directive discussed with you today. Even though you declined this today, please call our office should you change your mind, and we can give you the proper paperwork for you to fill out.   Conditions/risks identified: Aim for 30 minutes of exercise or brisk walking each day, drink 6-8 glasses of water and eat lots of fruits and vegetables.   Next appointment: Follow up in one year for your annual wellness visit    Preventive Care 65 Years and Older, Female Preventive care refers to lifestyle choices and visits with your health care provider that can promote health and wellness. What does preventive care include? A yearly physical exam. This is also called an annual well check. Dental exams once or twice a year. Routine eye exams. Ask your health care provider how often you should have your eyes checked. Personal lifestyle choices, including: Daily care of your teeth and gums. Regular physical activity. Eating a healthy diet. Avoiding tobacco and drug use. Limiting alcohol use. Practicing safe sex. Taking low-dose aspirin every day. Taking vitamin and  mineral supplements as recommended by your health care provider. What happens during an annual well check? The services and screenings done by your health care provider during your annual well check will depend on your age, overall health, lifestyle risk factors, and family history of disease. Counseling  Your health care provider may ask you questions about your: Alcohol use. Tobacco use. Drug use. Emotional well-being. Home and relationship well-being. Sexual activity. Eating habits. History of falls. Memory and ability to understand (cognition). Work and work Statistician. Reproductive health. Screening  You may have the following tests or measurements: Height, weight, and BMI. Blood pressure. Lipid and cholesterol levels. These may be checked every 5 years, or more frequently if you are over 28 years old. Skin check. Lung cancer screening. You may have this screening every year starting at age 54 if you have a 30-pack-year history of smoking and currently smoke or have quit within the past 15 years. Fecal occult blood test (FOBT) of the stool. You may have this test every year starting at age 48. Flexible sigmoidoscopy or colonoscopy. You may have a sigmoidoscopy every 5 years or a colonoscopy every 10 years starting at age 46. Hepatitis C blood test. Hepatitis B blood test. Sexually transmitted disease (STD) testing. Diabetes screening. This is done by checking your blood sugar (glucose) after you have not eaten for a while (fasting). You may have this done every 1-3 years. Bone density scan. This is done to screen for osteoporosis. You may have this done starting at age 36. Mammogram. This may be done every 1-2 years. Talk to your health care provider about how often you should have regular mammograms. Talk with your health care provider about your test  results, treatment options, and if necessary, the need for more tests. Vaccines  Your health care provider may recommend  certain vaccines, such as: Influenza vaccine. This is recommended every year. Tetanus, diphtheria, and acellular pertussis (Tdap, Td) vaccine. You may need a Td booster every 10 years. Zoster vaccine. You may need this after age 46. Pneumococcal 13-valent conjugate (PCV13) vaccine. One dose is recommended after age 60. Pneumococcal polysaccharide (PPSV23) vaccine. One dose is recommended after age 46. Talk to your health care provider about which screenings and vaccines you need and how often you need them. This information is not intended to replace advice given to you by your health care provider. Make sure you discuss any questions you have with your health care provider. Document Released: 12/09/2015 Document Revised: 08/01/2016 Document Reviewed: 09/13/2015 Elsevier Interactive Patient Education  2017 [employer] Prevention in the Home Falls can cause injuries. They can happen to people of all ages. There are many things you can do to make your home safe and to help prevent falls. What can I do on the outside of my home? Regularly fix the edges of walkways and driveways and fix any cracks. Remove anything that might make you trip as you walk through a door, such as a raised step or threshold. Trim any bushes or trees on the path to your home. Use bright outdoor lighting. Clear any walking paths of anything that might make someone trip, such as rocks or tools. Regularly check to see if handrails are loose or broken. Make sure that both sides of any steps have handrails. Any raised decks and porches should have guardrails on the edges. Have any leaves, snow, or ice cleared regularly. Use sand or salt on walking paths during winter. Clean up any spills in your garage right away. This includes oil or grease spills. What can I do in the bathroom? Use night lights. Install grab bars by the toilet and in the tub and shower. Do not use towel bars as grab bars. Use non-skid mats or  decals in the tub or shower. If you need to sit down in the shower, use a plastic, non-slip stool. Keep the floor dry. Clean up any water that spills on the floor as soon as it happens. Remove soap buildup in the tub or shower regularly. Attach bath mats securely with double-sided non-slip rug tape. Do not have throw rugs and other things on the floor that can make you trip. What can I do in the bedroom? Use night lights. Make sure that you have a light by your bed that is easy to reach. Do not use any sheets or blankets that are too big for your bed. They should not hang down onto the floor. Have a firm chair that has side arms. You can use this for support while you get dressed. Do not have throw rugs and other things on the floor that can make you trip. What can I do in the kitchen? Clean up any spills right away. Avoid walking on wet floors. Keep items that you use a lot in easy-to-reach places. If you need to reach something above you, use a strong step stool that has a grab bar. Keep electrical cords out of the way. Do not use floor polish or wax that makes floors slippery. If you must use wax, use non-skid floor wax. Do not have throw rugs and other things on the floor that can make you trip. What can I do with my stairs?  Do not leave any items on the stairs. Make sure that there are handrails on both sides of the stairs and use them. Fix handrails that are broken or loose. Make sure that handrails are as long as the stairways. Check any carpeting to make sure that it is firmly attached to the stairs. Fix any carpet that is loose or worn. Avoid having throw rugs at the top or bottom of the stairs. If you do have throw rugs, attach them to the floor with carpet tape. Make sure that you have a light switch at the top of the stairs and the bottom of the stairs. If you do not have them, ask someone to add them for you. What else can I do to help prevent falls? Wear shoes that: Do not  have high heels. Have rubber bottoms. Are comfortable and fit you well. Are closed at the toe. Do not wear sandals. If you use a stepladder: Make sure that it is fully opened. Do not climb a closed stepladder. Make sure that both sides of the stepladder are locked into place. Ask someone to hold it for you, if possible. Clearly mark and make sure that you can see: Any grab bars or handrails. First and last steps. Where the edge of each step is. Use tools that help you move around (mobility aids) if they are needed. These include: Canes. Walkers. Scooters. Crutches. Turn on the lights when you go into a dark area. Replace any light bulbs as soon as they burn out. Set up your furniture so you have a clear path. Avoid moving your furniture around. If any of your floors are uneven, fix them. If there are any pets around you, be aware of where they are. Review your medicines with your doctor. Some medicines can make you feel dizzy. This can increase your chance of falling. Ask your doctor what other things that you can do to help prevent falls. This information is not intended to replace advice given to you by your health care provider. Make sure you discuss any questions you have with your health care provider. Document Released: 09/08/2009 Document Revised: 04/19/2016 Document Reviewed: 12/17/2014 Elsevier Interactive Patient Education  2017 [employer].

## 2021-12-06 NOTE — Progress Notes (Signed)
Subjective:   Misty Wilkerson is a 81 y.o. female who presents for an Initial Medicare Annual Wellness Visit.  Virtual Visit via Telephone Note  I connected with  Misty Wilkerson on 12/06/21 at  4:15 PM EST by telephone and verified that I am speaking with the correct person using two identifiers.  Location: Patient: Home Provider: [facility] Persons participating in the virtual visit: patient/Nurse Health Advisor   I discussed the limitations, risks, security and privacy concerns of performing an evaluation and management service by telephone and the availability of in person appointments. The patient expressed understanding and agreed to proceed.  Interactive audio and video telecommunications were attempted between this nurse and patient, however failed, due to patient having technical difficulties OR patient did not have access to video capability.  We continued and completed visit with audio only.  Some vital signs may be absent or patient reported.   Misty Wilkerson E Holli Rengel, LPN   Review of Systems     Cardiac Risk Factors include: advanced age (>9men, >79 women);hypertension;dyslipidemia;sedentary lifestyle     Objective:    Today's Vitals   12/06/21 1619  Weight: 158 lb (71.7 kg)   Body mass index is 29.37 kg/m.  Advanced Directives 12/06/2021 09/19/2021 03/25/2018 03/07/2018 12/06/2016 12/08/2015 10/26/2015  Does Patient Have a Medical Advance Directive? No No No No No No No  Would patient like information on creating a medical advance directive? No - Patient declined No - Patient declined No - Patient declined No - Patient declined - - No - patient declined information    Current Medications (verified) Outpatient Encounter Medications as of 12/06/2021  Medication Sig   alendronate (FOSAMAX) 70 MG tablet Take 1 tablet (70 mg total) by mouth every 7 (seven) days. Take with a full glass of water on an empty stomach.   cholecalciferol (VITAMIN D3) 25 MCG (1000 UNIT) tablet Take 2  tablets (2,000 Units total) by mouth daily.   valsartan (DIOVAN) 160 MG tablet Take 1 tablet (160 mg total) by mouth daily. For blood pressure.   vitamin B-12 (CYANOCOBALAMIN) 1000 MCG tablet Take 1,000 mcg by mouth daily.   No facility-administered encounter medications on file as of 12/06/2021.    Allergies (verified) Penicillins and Tetracyclines & related   History: Past Medical History:  Diagnosis Date   Arthritis    Cancer ([place])    left breast   H/O hiatal hernia    eats slowly  now   Hypertension    Personal history of radiation therapy 2002   left breast   Vertigo    Past Surgical History:  Procedure Laterality Date   BREAST LUMPECTOMY Left 2002, 2014   CHOLECYSTECTOMY     COLONOSCOPY N/A 10/26/2015   Procedure: COLONOSCOPY;  Surgeon: Rogene Houston, MD;  [facility];  Service: Endoscopy;  Laterality: N/A;  [place] WOUND Left 03/25/2018   Procedure: DEBRIDEMENTINTERPHALANGEAL LEFT INDEX FINGER;  Surgeon: Daryll Brod, MD;  Location: [place];  Service: Orthopedics;  Laterality: Left;   EXCISION MASS UPPER EXTREMETIES Left 03/25/2018   Procedure: EXCISION MASS LEFT INDEX;  Surgeon: Daryll Brod, MD;  Location: [place];  Service: Orthopedics;  Laterality: Left;   EYE SURGERY     bilateral cataracts   PARTIAL MASTECTOMY WITH NEEDLE LOCALIZATION AND AXILLARY SENTINEL LYMPH NODE BX Left 08/12/2013   Procedure: LEFT PARTIAL MASTECTOMY WITH NEEDLE LOCALIZATION ATTEMPTED AXILLARY SENTINEL LYMPH NODE BIOPSY;  Surgeon: Rolm Bookbinder, MD;  Location: [facility]  OR;  Service: General;  Laterality: Left;   RE-EXCISION OF BREAST LUMPECTOMY Left 08/27/2013   Procedure: RE-EXCISION OF BREAST LUMPECTOMY;  Surgeon: Rolm Bookbinder, MD;  Location: [facility];  Service: General;  Laterality: Left;   Family History  Problem Relation Age of Onset   Hypertension Mother    Diabetes Father    Stroke Father    Breast cancer Sister 67    Breast cancer Sister    Aneurysm Sister    Cancer Brother    Social History   Socioeconomic History   Marital status: Married    Spouse name: Misty Wilkerson   Number of children: 2   Years of education: Not on file   Highest education level: Not on file  Occupational History   Occupation: retired  Tobacco Use   Smoking status: Former    Packs/day: 0.50    Years: 15.00    Pack years: 7.50    Types: Cigarettes    Quit date: 07/22/1999    Years since quitting: 22.3   Smokeless tobacco: Never  Vaping Use   Vaping Use: Never used  Substance and Sexual Activity   Alcohol use: Yes    Comment: one alcohol drink daily   Drug use: No   Sexual activity: Yes  Other Topics Concern   Not on file  Social History Narrative   Very independent and pretty active   Social Determinants of Health   Financial Resource Strain: Low Risk    Difficulty of Paying Living Expenses: Not hard at all  Food Insecurity: No Food Insecurity   Worried About Charity fundraiser in the Last Year: Never true   [place] in the Last Year: Never true  Transportation Needs: No Transportation Needs   Lack of Transportation (Medical): No   Lack of Transportation (Non-Medical): No  Physical Activity: Insufficiently Active   Days of Exercise per Week: 4 days   Minutes of Exercise per Session: 30 min  Stress: No Stress Concern Present   Feeling of Stress : Not at all  Social Connections: Socially Integrated   Frequency of Communication with Friends and Family: More than three times a week   Frequency of Social Gatherings with Friends and Family: More than three times a week   Attends Religious Services: More than 4 times per year   Active Member of [employer] or Organizations: Yes   Attends Music therapist: More than 4 times per year   Marital Status: Married    Tobacco Counseling Counseling given: Not Answered   Clinical Intake:  Pre-visit preparation completed: Yes  Pain : No/denies  pain     BMI - recorded: 29.37 Nutritional Status: BMI 25 -29 Overweight Nutritional Risks: None Diabetes: No  How often do you need to have someone help you when you read instructions, pamphlets, or other written materials from your doctor or pharmacy?: 1 - Never  Diabetic? no  Interpreter Needed?: No  Information entered by :: Jovany Disano, LPN   Activities of Daily Living In your present state of health, do you have any difficulty performing the following activities: 12/06/2021  Hearing? N  Vision? N  Difficulty concentrating or making decisions? N  Walking or climbing stairs? N  Dressing or bathing? N  Doing errands, shopping? N  Preparing Food and eating ? N  Using the Toilet? N  In the past six months, have you accidently leaked urine? Y  Comment mild - only if she waits too long  Do you  have problems with loss of bowel control? N  Managing your Medications? N  Managing your Finances? N  Housekeeping or managing your Housekeeping? N  Some recent data might be hidden    Patient Care Team: Claretta Fraise, MD as PCP - General (Family Medicine)  Indicate any recent Medical Services you may have received from other than [facility] providers in the past year (date may be approximate).     Assessment:   This is a routine wellness examination for Misty Wilkerson.  Hearing/Vision screen Hearing Screening - Comments:: Denies hearing difficulties  Vision Screening - Comments:: Up to date with annual eye exams with [facility] - wears rx glasses prn  Dietary issues and exercise activities discussed: Current Exercise Habits: Home exercise routine, Type of exercise: walking, Time (Minutes): 30, Frequency (Times/Week): 3, Weekly Exercise (Minutes/Week): 90, Intensity: Mild, Exercise limited by: None identified   Goals Addressed             This Visit's Progress    Exercise 150 min/wk Moderate Activity         Depression Screen PHQ 2/9 Scores 12/06/2021 12/04/2021 12/04/2021 10/23/2021   PHQ - 2 Score 0 0 0 0  PHQ- 9 Score 1 1 - -    Fall Risk Fall Risk  12/06/2021 12/04/2021 10/23/2021 12/08/2014  Falls in the past year? 0 0 0 No  Number falls in past yr: 0 - - -  Injury with Fall? 0 - - -  Risk for fall due to : No Fall Risks - - -  Follow up Falls prevention discussed - - -    FALL RISK PREVENTION PERTAINING TO THE HOME:  Any stairs in or around the home? Yes  If so, are there any without handrails? No  Home free of loose throw rugs in walkways, pet beds, electrical cords, etc? Yes  Adequate lighting in your home to reduce risk of falls? Yes   ASSISTIVE DEVICES UTILIZED TO PREVENT FALLS:  Life alert? No  Use of a cane, walker or w/c? No  Grab bars in the bathroom? No  Shower chair or bench in shower? No  Elevated toilet seat or a handicapped toilet? No   TIMED UP AND GO:  Was the test performed? No . Telephonic visit  Cognitive Function:     6CIT Screen 12/06/2021  What Year? 0 points  What month? 0 points  What time? 0 points  Count back from 20 0 points  Months in reverse 0 points  Repeat phrase 0 points  Total Score 0    Immunizations Immunization History  Administered Date(s) Administered   Hepatitis B 08/13/1995, 09/17/1995   Influenza Split 08/14/2021   Influenza, High Dose Seasonal PF 09/08/2018   Influenza,inj,quad, With Preservative 10/05/2017   Moderna SARS-COV2 Booster Vaccination 09/28/2020, 08/15/2021   Moderna Sars-Covid-2 Vaccination 12/08/2019, 01/17/2021   Pneumococcal Conjugate-13 03/16/2014   Pneumococcal Polysaccharide-23 09/26/2009   Zoster Recombinat (Shingrix) 06/19/2019, 11/25/2019    TDAP status: Due, Education has been provided regarding the importance of this vaccine. Advised may receive this vaccine at local pharmacy or Health Dept. Aware to provide a copy of the vaccination record if obtained from local pharmacy or Health Dept. Verbalized acceptance and understanding.  Flu Vaccine status: Up to  date  Pneumococcal vaccine status: Up to date  Covid-19 vaccine status: Completed vaccines  Qualifies for Shingles Vaccine? Yes   Zostavax completed Yes   Shingrix Completed?: Yes  Screening Tests Health Maintenance  Topic Date Due   COVID-19 Vaccine (3 -  Moderna risk series) 12/20/2021 (Originally 09/12/2021)   TETANUS/TDAP  12/04/2022 (Originally 09/21/1960)   Hepatitis C Screening  12/04/2022 (Originally 09/21/1959)   Pneumonia Vaccine 82+ Years old  Completed   INFLUENZA VACCINE  Completed   DEXA SCAN  Completed   Zoster Vaccines- Shingrix  Completed   HPV VACCINES  Aged Out    Health Maintenance  There are no preventive care reminders to display for this patient.  Colorectal cancer screening: No longer required.   Mammogram status: Completed 06/08/21. Repeat every year  Bone Density status: Completed 11/03/2021. Results reflect: Bone density results: OSTEOPENIA. Repeat every 2 years.  Lung Cancer Screening: (Low Dose CT Chest recommended if Age 54-80 years, 30 pack-year currently smoking OR have quit w/in 15years.) does not qualify.   Additional Screening:  Hepatitis C Screening: does not qualify  Vision Screening: Recommended annual ophthalmology exams for early detection of glaucoma and other disorders of the eye. Is the patient up to date with their annual eye exam?  Yes  Who is the provider or what is the name of the office in which the patient attends annual eye exams? [facility] If pt is not established with a provider, would they like to be referred to a provider to establish care? No .   Dental Screening: Recommended annual dental exams for proper oral hygiene  Community Resource Referral / Chronic Care Management: CRR required this visit?  No   CCM required this visit?  No      Plan:     I have personally reviewed and noted the following in the patients chart:   Medical and social history Use of alcohol, tobacco or illicit drugs  Current  medications and supplements including opioid prescriptions. Patient is not currently taking opioid prescriptions. Functional ability and status Nutritional status Physical activity Advanced directives List of other physicians Hospitalizations, surgeries, and ER visits in previous 12 months Vitals Screenings to include cognitive, depression, and falls Referrals and appointments  In addition, I have reviewed and discussed with patient certain preventive protocols, quality metrics, and best practice recommendations. A written personalized care plan for preventive services as well as general preventive health recommendations were provided to patient.     Sandrea Hammond, LPN   [id number]   Nurse Notes: None

## 2021-12-12 ENCOUNTER — Other Ambulatory Visit: Payer: Self-pay

## 2021-12-12 ENCOUNTER — Encounter (HOSPITAL_BASED_OUTPATIENT_CLINIC_OR_DEPARTMENT_OTHER): Payer: Self-pay | Admitting: Otolaryngology

## 2021-12-14 ENCOUNTER — Encounter (HOSPITAL_BASED_OUTPATIENT_CLINIC_OR_DEPARTMENT_OTHER)
Admission: RE | Admit: 2021-12-14 | Discharge: 2021-12-14 | Disposition: A | Discharge status: Home / Self Care | Payer: Medicare PPO | Source: Ambulatory Visit | Attending: Otolaryngology | Admitting: Otolaryngology

## 2021-12-14 DIAGNOSIS — Z01810 Encounter for preprocedural cardiovascular examination: Secondary | ICD-10-CM | POA: Insufficient documentation

## 2021-12-18 ENCOUNTER — Encounter: Payer: Self-pay | Admitting: Family Medicine

## 2021-12-25 ENCOUNTER — Ambulatory Visit (HOSPITAL_BASED_OUTPATIENT_CLINIC_OR_DEPARTMENT_OTHER): Payer: Medicare PPO | Admitting: Anesthesiology

## 2021-12-25 ENCOUNTER — Ambulatory Visit (HOSPITAL_BASED_OUTPATIENT_CLINIC_OR_DEPARTMENT_OTHER)
Admission: RE | Admit: 2021-12-25 | Discharge: 2021-12-25 | Disposition: A | Discharge status: Home / Self Care | Payer: Medicare PPO | Attending: Otolaryngology | Admitting: Otolaryngology

## 2021-12-25 ENCOUNTER — Other Ambulatory Visit: Payer: Self-pay

## 2021-12-25 ENCOUNTER — Encounter (HOSPITAL_BASED_OUTPATIENT_CLINIC_OR_DEPARTMENT_OTHER): Payer: Self-pay | Admitting: Otolaryngology

## 2021-12-25 ENCOUNTER — Encounter (HOSPITAL_BASED_OUTPATIENT_CLINIC_OR_DEPARTMENT_OTHER)
Admission: RE | Disposition: A | Discharge status: Home / Self Care | Payer: Self-pay | Source: Home / Self Care | Attending: Otolaryngology

## 2021-12-25 DIAGNOSIS — J339 Nasal polyp, unspecified: Secondary | ICD-10-CM | POA: Diagnosis not present

## 2021-12-25 DIAGNOSIS — J341 Cyst and mucocele of nose and nasal sinus: Secondary | ICD-10-CM | POA: Insufficient documentation

## 2021-12-25 DIAGNOSIS — M1990 Unspecified osteoarthritis, unspecified site: Secondary | ICD-10-CM | POA: Insufficient documentation

## 2021-12-25 DIAGNOSIS — J310 Chronic rhinitis: Secondary | ICD-10-CM | POA: Insufficient documentation

## 2021-12-25 DIAGNOSIS — H6992 Unspecified Eustachian tube disorder, left ear: Secondary | ICD-10-CM | POA: Insufficient documentation

## 2021-12-25 DIAGNOSIS — C50919 Malignant neoplasm of unspecified site of unspecified female breast: Secondary | ICD-10-CM | POA: Diagnosis not present

## 2021-12-25 DIAGNOSIS — J338 Other polyp of sinus: Secondary | ICD-10-CM | POA: Insufficient documentation

## 2021-12-25 DIAGNOSIS — J320 Chronic maxillary sinusitis: Secondary | ICD-10-CM | POA: Diagnosis not present

## 2021-12-25 DIAGNOSIS — R519 Headache, unspecified: Secondary | ICD-10-CM | POA: Diagnosis not present

## 2021-12-25 DIAGNOSIS — M810 Age-related osteoporosis without current pathological fracture: Secondary | ICD-10-CM | POA: Diagnosis not present

## 2021-12-25 DIAGNOSIS — H6121 Impacted cerumen, right ear: Secondary | ICD-10-CM | POA: Insufficient documentation

## 2021-12-25 DIAGNOSIS — R0982 Postnasal drip: Secondary | ICD-10-CM | POA: Insufficient documentation

## 2021-12-25 DIAGNOSIS — I10 Essential (primary) hypertension: Secondary | ICD-10-CM | POA: Diagnosis not present

## 2021-12-25 DIAGNOSIS — Z87891 Personal history of nicotine dependence: Secondary | ICD-10-CM | POA: Diagnosis not present

## 2021-12-25 DIAGNOSIS — H903 Sensorineural hearing loss, bilateral: Secondary | ICD-10-CM | POA: Insufficient documentation

## 2021-12-25 DIAGNOSIS — J343 Hypertrophy of nasal turbinates: Secondary | ICD-10-CM | POA: Insufficient documentation

## 2021-12-25 HISTORY — PX: MAXILLARY ANTROSTOMY: SHX2003

## 2021-12-25 SURGERY — MAXILLARY ANTROSTOMY
Anesthesia: General | Site: Nose | Laterality: Left

## 2021-12-25 MED ORDER — MUPIROCIN 2 % EX OINT
TOPICAL_OINTMENT | CUTANEOUS | Status: AC
  Filled 2021-12-25: qty 22

## 2021-12-25 MED ORDER — BACITRACIN ZINC 500 UNIT/GM EX OINT
TOPICAL_OINTMENT | CUTANEOUS | Status: AC
  Filled 2021-12-25: qty 28.35

## 2021-12-25 MED ORDER — OXYCODONE HCL 5 MG/5ML PO SOLN: 5.0000 mg | Freq: Once | ORAL | Status: DC | PRN

## 2021-12-25 MED ORDER — PHENYLEPHRINE HCL (PRESSORS) 10 MG/ML IV SOLN
INTRAVENOUS | Status: DC | PRN
Start: 2021-12-25 — End: 2021-12-25
  Administered 2021-12-25: 40 ug via INTRAVENOUS

## 2021-12-25 MED ORDER — LACTATED RINGERS IV SOLN: INTRAVENOUS | Status: DC

## 2021-12-25 MED ORDER — ROCURONIUM BROMIDE 100 MG/10ML IV SOLN
INTRAVENOUS | Status: DC | PRN
  Administered 2021-12-25: 60 mg via INTRAVENOUS

## 2021-12-25 MED ORDER — PROPOFOL 10 MG/ML IV BOLUS
INTRAVENOUS | Status: AC
  Filled 2021-12-25: qty 20

## 2021-12-25 MED ORDER — OXYCODONE HCL 5 MG PO TABS: 5.0000 mg | ORAL_TABLET | Freq: Once | ORAL | Status: DC | PRN

## 2021-12-25 MED ORDER — EPINEPHRINE PF 1 MG/ML IJ SOLN
INTRAMUSCULAR | Status: AC
  Filled 2021-12-25: qty 2

## 2021-12-25 MED ORDER — AMISULPRIDE (ANTIEMETIC) 5 MG/2ML IV SOLN: 10.0000 mg | Freq: Once | INTRAVENOUS | Status: DC | PRN

## 2021-12-25 MED ORDER — ACETAMINOPHEN 325 MG PO TABS: 325.0000 mg | ORAL_TABLET | ORAL | Status: DC | PRN

## 2021-12-25 MED ORDER — LIDOCAINE-EPINEPHRINE 1 %-1:100000 IJ SOLN
INTRAMUSCULAR | Status: AC
  Filled 2021-12-25: qty 1

## 2021-12-25 MED ORDER — PROPOFOL 10 MG/ML IV BOLUS
INTRAVENOUS | Status: DC | PRN
  Administered 2021-12-25: 120 mg via INTRAVENOUS

## 2021-12-25 MED ORDER — OXYMETAZOLINE HCL 0.05 % NA SOLN
NASAL | Status: DC | PRN
  Administered 2021-12-25: 1 via TOPICAL

## 2021-12-25 MED ORDER — DEXAMETHASONE SODIUM PHOSPHATE 10 MG/ML IJ SOLN
INTRAMUSCULAR | Status: AC
  Filled 2021-12-25: qty 1

## 2021-12-25 MED ORDER — ACETAMINOPHEN 160 MG/5ML PO SOLN: 325.0000 mg | ORAL | Status: DC | PRN

## 2021-12-25 MED ORDER — ONDANSETRON HCL 4 MG/2ML IJ SOLN
INTRAMUSCULAR | Status: DC | PRN
  Administered 2021-12-25: 4 mg via INTRAVENOUS

## 2021-12-25 MED ORDER — OXYMETAZOLINE HCL 0.05 % NA SOLN
NASAL | Status: AC
  Filled 2021-12-25: qty 60

## 2021-12-25 MED ORDER — CLINDAMYCIN PHOSPHATE 900 MG/50ML IV SOLN
INTRAVENOUS | Status: DC | PRN
Start: 2021-12-25 — End: 2021-12-25
  Administered 2021-12-25: 900 mg via INTRAVENOUS

## 2021-12-25 MED ORDER — LIDOCAINE 2% (20 MG/ML) 5 ML SYRINGE
INTRAMUSCULAR | Status: AC
  Filled 2021-12-25: qty 5

## 2021-12-25 MED ORDER — ONDANSETRON HCL 4 MG/2ML IJ SOLN
INTRAMUSCULAR | Status: AC
  Filled 2021-12-25: qty 2

## 2021-12-25 MED ORDER — FENTANYL CITRATE (PF) 100 MCG/2ML IJ SOLN
INTRAMUSCULAR | Status: DC | PRN
  Administered 2021-12-25 (×2): 50 ug via INTRAVENOUS

## 2021-12-25 MED ORDER — ACETAMINOPHEN 10 MG/ML IV SOLN
1000.0000 mg | Freq: Once | INTRAVENOUS | Status: DC | PRN
  Administered 2021-12-25: 1000 mg via INTRAVENOUS

## 2021-12-25 MED ORDER — FENTANYL CITRATE (PF) 100 MCG/2ML IJ SOLN
INTRAMUSCULAR | Status: AC
  Filled 2021-12-25: qty 2

## 2021-12-25 MED ORDER — MUPIROCIN 2 % EX OINT
TOPICAL_OINTMENT | CUTANEOUS | Status: DC | PRN
  Administered 2021-12-25: 1 via TOPICAL

## 2021-12-25 MED ORDER — PHENYLEPHRINE 40 MCG/ML (10ML) SYRINGE FOR IV PUSH (FOR BLOOD PRESSURE SUPPORT)
PREFILLED_SYRINGE | INTRAVENOUS | Status: AC
  Filled 2021-12-25: qty 10

## 2021-12-25 MED ORDER — ACETAMINOPHEN 10 MG/ML IV SOLN
INTRAVENOUS | Status: AC
  Filled 2021-12-25: qty 100

## 2021-12-25 MED ORDER — CIPROFLOXACIN-DEXAMETHASONE 0.3-0.1 % OT SUSP
OTIC | Status: AC
  Filled 2021-12-25: qty 7.5

## 2021-12-25 MED ORDER — PROMETHAZINE HCL 25 MG/ML IJ SOLN: 6.2500 mg | INTRAMUSCULAR | Status: DC | PRN

## 2021-12-25 MED ORDER — LIDOCAINE HCL (CARDIAC) PF 100 MG/5ML IV SOSY
PREFILLED_SYRINGE | INTRAVENOUS | Status: DC | PRN
  Administered 2021-12-25: 40 mg via INTRAVENOUS

## 2021-12-25 MED ORDER — CLINDAMYCIN HCL 300 MG PO CAPS
300.0000 mg | ORAL_CAPSULE | Freq: Three times a day (TID) | ORAL | 0 refills | Status: AC
Start: 2021-12-25 — End: 2021-12-30

## 2021-12-25 MED ORDER — DEXAMETHASONE SODIUM PHOSPHATE 4 MG/ML IJ SOLN
INTRAMUSCULAR | Status: DC | PRN
  Administered 2021-12-25: 8 mg via INTRAVENOUS

## 2021-12-25 MED ORDER — SUGAMMADEX SODIUM 500 MG/5ML IV SOLN
INTRAVENOUS | Status: AC
  Filled 2021-12-25: qty 5

## 2021-12-25 MED ORDER — CLINDAMYCIN PHOSPHATE 900 MG/50ML IV SOLN
INTRAVENOUS | Status: AC
  Filled 2021-12-25: qty 50

## 2021-12-25 MED ORDER — SUGAMMADEX SODIUM 500 MG/5ML IV SOLN
INTRAVENOUS | Status: DC | PRN
  Administered 2021-12-25: 300 mg via INTRAVENOUS

## 2021-12-25 MED ORDER — FENTANYL CITRATE (PF) 100 MCG/2ML IJ SOLN: 25.0000 ug | INTRAMUSCULAR | Status: DC | PRN

## 2021-12-25 MED ORDER — 0.9 % SODIUM CHLORIDE (POUR BTL) OPTIME
TOPICAL | Status: DC | PRN
  Administered 2021-12-25: 25 mL

## 2021-12-25 SURGICAL SUPPLY — 34 items
BLADE RAD40 ROTATE 4M 4 5PK (BLADE) IMPLANT
BLADE RAD60 ROTATE M4 4 5PK (BLADE) IMPLANT
BLADE TRICUT ROTATE M4 4 5PK (BLADE) ×1 IMPLANT
CANISTER SUC SOCK COL 7IN (MISCELLANEOUS) ×2 IMPLANT
CANISTER SUCT 1200ML W/VALVE (MISCELLANEOUS) ×2 IMPLANT
COAGULATOR SUCT 8FR VV (MISCELLANEOUS) IMPLANT
DECANTER SPIKE VIAL GLASS SM (MISCELLANEOUS) IMPLANT
DEFOGGER MIRROR 1QT (MISCELLANEOUS) ×2 IMPLANT
DRSG NASOPORE 8CM (GAUZE/BANDAGES/DRESSINGS) ×1 IMPLANT
ELECT REM PT RETURN 9FT ADLT (ELECTROSURGICAL)
ELECTRODE REM PT RTRN 9FT ADLT (ELECTROSURGICAL) IMPLANT
GLOVE SURG ENC MOIS LTX SZ7.5 (GLOVE) ×2 IMPLANT
GLOVE SURG POLYISO LF SZ7 (GLOVE) ×1 IMPLANT
GLOVE SURG UNDER POLY LF SZ7 (GLOVE) ×1 IMPLANT
GOWN STRL REUS W/ TWL LRG LVL3 (GOWN DISPOSABLE) ×2 IMPLANT
GOWN STRL REUS W/TWL LRG LVL3 (GOWN DISPOSABLE) ×4
HEMOSTAT SURGICEL 2X14 (HEMOSTASIS) IMPLANT
IV NS 500ML (IV SOLUTION) ×2
IV NS 500ML BAXH (IV SOLUTION) ×1 IMPLANT
NDL HYPO 25X1 1.5 SAFETY (NEEDLE) ×1 IMPLANT
NEEDLE HYPO 25X1 1.5 SAFETY (NEEDLE) IMPLANT
NS IRRIG 1000ML POUR BTL (IV SOLUTION) ×1 IMPLANT
PACK BASIN DAY SURGERY FS (CUSTOM PROCEDURE TRAY) ×2 IMPLANT
PACK ENT DAY SURGERY (CUSTOM PROCEDURE TRAY) ×2 IMPLANT
SLEEVE SCD COMPRESS KNEE MED (STOCKING) ×1 IMPLANT
SPONGE GAUZE 2X2 8PLY STRL LF (GAUZE/BANDAGES/DRESSINGS) ×2 IMPLANT
SPONGE NEURO XRAY DETECT 1X3 (DISPOSABLE) ×2 IMPLANT
SUCTION FRAZIER HANDLE 10FR (MISCELLANEOUS)
SUCTION TUBE FRAZIER 10FR DISP (MISCELLANEOUS) IMPLANT
SYR 50ML LL SCALE MARK (SYRINGE) ×1 IMPLANT
TOWEL GREEN STERILE FF (TOWEL DISPOSABLE) ×2 IMPLANT
TUBE CONNECTING 20X1/4 (TUBING) ×2 IMPLANT
TUBE SALEM SUMP 16 FR W/ARV (TUBING) ×1 IMPLANT
YANKAUER SUCT BULB TIP NO VENT (SUCTIONS) IMPLANT

## 2021-12-25 NOTE — H&P (Signed)
Cc: Chronic left maxillary sinusitis  HPI: The patient is a 81 year old female who presents today complaining of chronic left facial pain, left ear pressure, postnasal drainage, and chronic imbalance.  She recently underwent an MRI scan.  The MRI showed complete opacification of the left maxillary sinus.  Her left maxillary sinus was completely filled with a large mucous retention cyst.  The patient also complains of chronic postnasal drainage and left ear pressure.  Her hearing is often muffled.  She is currently on Flonase nasal spray.  She denies any fever, purulent drainage, visual change, otalgia, or otorrhea.  She has no previous history of ENT surgery.  The patient's review of systems (constitutional, eyes, ENT, cardiovascular, respiratory, GI, musculoskeletal, skin, neurologic, psychiatric, endocrine, hematologic, allergic) is noted in the ROS questionnaire.  It is reviewed with the patient.  Major events: Breast cancer.  Ongoing medical problems: Hypertension, arthritis, osteoporosis, breast cancer.  Family health history: Diabetes, heart disease.  Social history: The patient is married. She is former smoker. She drinks alcohol once a week. She denies the use of illegal drugs.    Exam: General: Communicates without difficulty, well nourished, no acute distress. Head: Normocephalic, no evidence injury, no tenderness, facial buttresses intact without stepoff. Face/sinus: No tenderness to palpation and percussion. Facial movement is normal and symmetric. Eyes: PERRL, EOMI. No scleral icterus, conjunctivae clear. Neuro: CN II exam reveals vision grossly intact.  No nystagmus at any point of gaze. EAC: Right ear cerumen impaction.  Under the operating microscope, the cerumen is carefully removed with a combination of cerumen currette, alligator forceps, and suction catheters.  After the cerumen is removed, the TMs are noted to be normal. Nose: External evaluation reveals normal support and skin  without lesions.  Dorsum is intact.  Anterior rhinoscopy reveals congested mucosa over anterior aspect of inferior turbinates and intact septum.  No purulence noted. Oral:  Oral cavity and oropharynx are intact, symmetric, without erythema or edema.  Mucosa is moist without lesions. Neck: Full range of motion without pain.  There is no significant lymphadenopathy.  No masses palpable.  Thyroid bed within normal limits to palpation.  Parotid glands and submandibular glands equal bilaterally without mass.  Trachea is midline. Neuro:  CN 2-12 grossly intact. Gait normal. A flexible scope was inserted into the right nasal cavity.  Endoscopy of the interior nasal cavity, superior, inferior, and middle meatus was performed. The sphenoid-ethmoid recess was examined. Edematous mucosa was noted. Olfactory cleft was clear.  Nasopharynx with serous drainage.  Turbinates were hypertrophied but without mass.  The procedure was repeated on the contralateral side with similar findings.  The left middle meatus was severely edematous.    Assessment  1.  Incidental finding of right ear cerumen impaction.  After the disimpaction procedure, both tympanic membranes and middle ear spaces are noted to be normal. 2.  Chronic rhinitis with nasal mucosal congestion and chronic left maxillary sinusitis.  A large mucous retention cyst is noted to completely opacified her left maxillary sinus.  The patient is currently symptomatic with chronic left facial pain. 3.  Left eustachian tube dysfunction. 4.  Chronic postnasal drainage. 5.  Bilateral high-frequency sensorineural hearing loss, likely secondary to presbycusis.  Plan  1.  Otomicroscopy with right ear cerumen disimpaction. 2.  The physical exam and nasal endoscopy findings are reviewed with the patient.  The MRI images are also reviewed with the patient. 3.  Atrovent nasal spray 2 sprays each nostril twice daily as needed nasal drainage.  Continue with Flonase nasal spray  daily.   4.  Based on the above findings, the patient will benefit from surgical intervention with left maxillary antrostomy and removal of the left maxillary mucous retention cyst.  The risk, benefits, and details of the procedure are reviewed. 5.  The hearing test results are reviewed with the patient. 6.  The patient would like to proceed with the sinus surgery.

## 2021-12-25 NOTE — Anesthesia Preprocedure Evaluation (Addendum)
Anesthesia Evaluation  Patient identified by MRN, date of birth, ID band Patient awake    Reviewed: Allergy & Precautions, NPO status , Patient's Chart, lab work & pertinent test results  Airway Mallampati: II  TM Distance: >3 FB Neck ROM: Full    Dental  (+) Teeth Intact, Dental Advisory Given   Pulmonary former smoker,    breath sounds clear to auscultation       Cardiovascular hypertension,  Rhythm:Regular Rate:Normal     Neuro/Psych negative neurological ROS  negative psych ROS   GI/Hepatic Neg liver ROS, hiatal hernia,   Endo/Other  negative endocrine ROS  Renal/GU negative Renal ROS     Musculoskeletal  (+) Arthritis ,   Abdominal Normal abdominal exam  (+)   Peds  Hematology negative hematology ROS (+)   Anesthesia Other Findings   Reproductive/Obstetrics                            Anesthesia Physical Anesthesia Plan  ASA: 2  Anesthesia Plan: General   Post-op Pain Management:    Induction: Intravenous  PONV Risk Score and Plan: 4 or greater and Ondansetron, Treatment may vary due to age or medical condition and Dexamethasone  Airway Management Planned: Oral ETT and LMA  Additional Equipment: None  Intra-op Plan:   Post-operative Plan: Extubation in OR  Informed Consent: I have reviewed the patients History and Physical, chart, labs and discussed the procedure including the risks, benefits and alternatives for the proposed anesthesia with the patient or authorized representative who has indicated his/her understanding and acceptance.     Dental advisory given  Plan Discussed with: CRNA  Anesthesia Plan Comments:        Anesthesia Quick Evaluation

## 2021-12-25 NOTE — Discharge Instructions (Addendum)

## 2021-12-25 NOTE — Op Note (Signed)
DATE OF PROCEDURE: 12/25/2021  OPERATIVE REPORT   SURGEON: Leta Baptist, MD   PREOPERATIVE DIAGNOSES:  1.  Chronic left maxillary sinusitis and polyposis  POSTOPERATIVE DIAGNOSES:  1.  Chronic left maxillary sinusitis and polyposis  PROCEDURE PERFORMED:  1.  Endoscopic left maxillary antrostomy with polyp removal  ANESTHESIA: General endotracheal tube anesthesia.   COMPLICATIONS: None.   ESTIMATED BLOOD LOSS: 50 mL.   INDICATION FOR PROCEDURE: Misty Wilkerson is a 81 y.o. female with a history of chronic left facial pain, left ear pressure, postnasal drainage, and chronic imbalance.  She recently underwent an MRI scan.  The MRI showed complete opacification of the left maxillary sinus.   The patient also complains of chronic postnasal drainage and left ear pressure.  She was treated with multiple courses of antibiotics without improvement in her symptoms.  Based on the above findings, the decision was made for the patient to undergo the above-stated procedure. The risks, benefits, alternatives, and details of the procedures were discussed with the patient. Questions were invited and answered. Informed consent was obtained.   DESCRIPTION OF PROCEDURE: The patient was taken to the operating room and placed supine on the operating table. General endotracheal tube anesthesia was administered by the anesthesiologist. The patient was positioned, and prepped and draped in the standard fashion for nasal surgery. Pledgets soaked with Afrin were placed in both nasal cavities for decongestion. The pledgets were subsequently removed.   Using a 0 endoscope, the left nasal cavity was examined. A large middle turbinate was noted. Using Tru-Cut forceps, the inferior one third and medial one half of the middle turbinate was resected. Polypoid tissue was noted within the middle meatus. The polypoid tissue was removed using a combination of microdebrider and Blakesley forceps. The uncinate process was resected  with a freer elevator. The maxillary antrum was entered and enlarged using a combination of backbiter and microdebrider. Polypoid tissue and purulent drainage were removed from the left maxillary sinus.  The left maxillary sinus was copiously irrigated with saline solution.  The care of the patient was turned over to the anesthesiologist. The patient was awakened from anesthesia without difficulty. The patient was extubated and transferred to the recovery room in good condition.   OPERATIVE FINDINGS: Chronic left maxillary sinusitis with polyposis.  A large amount of purulent drainage was suctioned from the left maxillary sinus.  SPECIMEN: Left maxillary sinus content  FOLLOWUP CARE: The patient be discharged home once she is awake and alert. The patient will be placed on clindamycin for 5 days. The patient will follow up in my office in 1 week.  [name], MD

## 2021-12-25 NOTE — Anesthesia Procedure Notes (Signed)
Procedure Name: Intubation Date/Time: 12/25/2021 7:44 AM Performed by: Ezequiel Kayser, CRNA Pre-anesthesia Checklist: Patient identified, Emergency Drugs available, Suction available and Patient being monitored Patient Re-evaluated:Patient Re-evaluated prior to induction Oxygen Delivery Method: Circle System Utilized Preoxygenation: Pre-oxygenation with 100% oxygen Induction Type: IV induction Ventilation: Mask ventilation without difficulty Laryngoscope Size: Mac and 3 Grade View: Grade I Tube type: Oral Tube size: 7.0 mm Number of attempts: 1 Airway Equipment and Method: Stylet and Oral airway Placement Confirmation: ETT inserted through vocal cords under direct vision, positive ETCO2 and breath sounds checked- equal and bilateral Secured at: 22 cm Tube secured with: Tape Dental Injury: Teeth and Oropharynx as per pre-operative assessment

## 2021-12-25 NOTE — Anesthesia Postprocedure Evaluation (Signed)
Anesthesia Post Note  Patient: Misty Wilkerson  Procedure(s) Performed: LEFT MAXILLARY ANTROSTOMY WITH TISSUE REMOVAL (Left: Nose)     Patient location during evaluation: PACU Anesthesia Type: General Level of consciousness: awake and alert Pain management: pain level controlled Vital Signs Assessment: post-procedure vital signs reviewed and stable Respiratory status: spontaneous breathing, nonlabored ventilation, respiratory function stable and patient connected to nasal cannula oxygen Cardiovascular status: blood pressure returned to baseline and stable Postop Assessment: no apparent nausea or vomiting Anesthetic complications: no   No notable events documented.  Last Vitals:  Vitals:   12/25/21 0845 12/25/21 0908  BP: (!) (P) 143/50 (!) 158/67  Pulse: 72 81  Resp: 17 16  Temp:  36.6 C  SpO2: 97% 96%    Last Pain:  Vitals:   12/25/21 0854  TempSrc:   PainSc: 0-No pain                 Effie Berkshire

## 2021-12-25 NOTE — Transfer of Care (Signed)
Immediate Anesthesia Transfer of Care Note  Patient: Misty Wilkerson  Procedure(s) Performed: LEFT MAXILLARY ANTROSTOMY WITH TISSUE REMOVAL (Left: Nose)  Patient Location: PACU  Anesthesia Type:General  Level of Consciousness: drowsy, patient cooperative and responds to stimulation  Airway & Oxygen Therapy: Patient Spontanous Breathing and Patient connected to face mask oxygen  Post-op Assessment: Report given to RN and Post -op Vital signs reviewed and stable  Post vital signs: Reviewed and stable  Last Vitals:  Vitals Value Taken Time  BP    Temp    Pulse    Resp    SpO2      Last Pain:  Vitals:   12/25/21 0637  TempSrc: Oral  PainSc: 0-No pain         Complications: No notable events documented.

## 2021-12-27 ENCOUNTER — Other Ambulatory Visit: Payer: Self-pay | Admitting: Family Medicine

## 2021-12-27 ENCOUNTER — Encounter (HOSPITAL_BASED_OUTPATIENT_CLINIC_OR_DEPARTMENT_OTHER): Payer: Self-pay | Admitting: Otolaryngology

## 2021-12-28 LAB — SURGICAL PATHOLOGY

## 2022-01-02 ENCOUNTER — Encounter: Payer: Self-pay | Admitting: Family Medicine

## 2022-01-02 DIAGNOSIS — J320 Chronic maxillary sinusitis: Secondary | ICD-10-CM | POA: Diagnosis not present

## 2022-01-02 DIAGNOSIS — J338 Other polyp of sinus: Secondary | ICD-10-CM | POA: Diagnosis not present

## 2022-01-02 MED ORDER — ALENDRONATE SODIUM 70 MG PO TABS: 70.0000 mg | ORAL_TABLET | ORAL | 3 refills | Status: DC

## 2022-01-02 MED ORDER — VALSARTAN 160 MG PO TABS: 160.0000 mg | ORAL_TABLET | Freq: Every day | ORAL | 5 refills | Status: DC

## 2022-02-15 DIAGNOSIS — J320 Chronic maxillary sinusitis: Secondary | ICD-10-CM | POA: Diagnosis not present

## 2022-02-15 DIAGNOSIS — J338 Other polyp of sinus: Secondary | ICD-10-CM | POA: Diagnosis not present

## 2022-03-05 ENCOUNTER — Ambulatory Visit: Payer: Medicare PPO | Admitting: Family Medicine

## 2022-03-05 ENCOUNTER — Encounter: Payer: Self-pay | Admitting: Family Medicine

## 2022-03-05 VITALS — BP 161/59 | HR 74 | Temp 98.0°F | Ht 61.5 in | Wt 166.6 lb

## 2022-03-05 DIAGNOSIS — I10 Essential (primary) hypertension: Secondary | ICD-10-CM

## 2022-03-05 DIAGNOSIS — M810 Age-related osteoporosis without current pathological fracture: Secondary | ICD-10-CM | POA: Diagnosis not present

## 2022-03-05 DIAGNOSIS — E7800 Pure hypercholesterolemia, unspecified: Secondary | ICD-10-CM

## 2022-03-05 DIAGNOSIS — J301 Allergic rhinitis due to pollen: Secondary | ICD-10-CM | POA: Diagnosis not present

## 2022-03-05 LAB — CMP14+EGFR
ALT: 16 IU/L (ref 0–32)
AST: 20 IU/L (ref 0–40)
Albumin/Globulin Ratio: 2 (ref 1.2–2.2)
Albumin: 4.5 g/dL (ref 3.7–4.7)
Alkaline Phosphatase: 62 IU/L (ref 44–121)
BUN/Creatinine Ratio: 13 (ref 12–28)
BUN: 17 mg/dL (ref 8–27)
Bilirubin Total: 0.2 mg/dL (ref 0.0–1.2)
CO2: 23 mmol/L (ref 20–29)
Calcium: 9.4 mg/dL (ref 8.7–10.3)
Chloride: 104 mmol/L (ref 96–106)
Creatinine, Ser: 1.26 mg/dL — ABNORMAL HIGH (ref 0.57–1.00)
Globulin, Total: 2.3 g/dL (ref 1.5–4.5)
Glucose: 92 mg/dL (ref 70–99)
Potassium: 5.6 mmol/L — ABNORMAL HIGH (ref 3.5–5.2)
Sodium: 139 mmol/L (ref 134–144)
Total Protein: 6.8 g/dL (ref 6.0–8.5)
eGFR: 43 mL/min/{1.73_m2} — ABNORMAL LOW (ref >59)

## 2022-03-05 LAB — LIPID PANEL
Chol/HDL Ratio: 3.4 ratio (ref 0.0–4.4)
Cholesterol, Total: 195 mg/dL (ref 100–199)
HDL: 58 mg/dL (ref >39)
LDL Chol Calc (NIH): 116 mg/dL — ABNORMAL HIGH (ref 0–99)
Triglycerides: 121 mg/dL (ref 0–149)
VLDL Cholesterol Cal: 21 mg/dL (ref 5–40)

## 2022-03-05 LAB — CBC WITH DIFFERENTIAL/PLATELET
Basophils Absolute: 0 10*3/uL (ref 0.0–0.2)
Basos: 0 %
EOS (ABSOLUTE): 0.2 10*3/uL (ref 0.0–0.4)
Eos: 4 %
Hematocrit: 40.7 % (ref 34.0–46.6)
Hemoglobin: 13.3 g/dL (ref 11.1–15.9)
Immature Grans (Abs): 0 10*3/uL (ref 0.0–0.1)
Immature Granulocytes: 0 %
Lymphocytes Absolute: 1.2 10*3/uL (ref 0.7–3.1)
Lymphs: 19 %
MCH: 30.4 pg (ref 26.6–33.0)
MCHC: 32.7 g/dL (ref 31.5–35.7)
MCV: 93 fL (ref 79–97)
Monocytes Absolute: 0.5 10*3/uL (ref 0.1–0.9)
Monocytes: 8 %
Neutrophils Absolute: 4.1 10*3/uL (ref 1.4–7.0)
Neutrophils: 69 %
Platelets: 199 10*3/uL (ref 150–450)
RBC: 4.37 x10E6/uL (ref 3.77–5.28)
RDW: 11.8 % (ref 11.7–15.4)
WBC: 6 10*3/uL (ref 3.4–10.8)

## 2022-03-05 NOTE — Progress Notes (Signed)
? ?Subjective:  ?Patient ID: Misty Wilkerson, female    DOB: 04-May-1941  Age: 81 y.o. MRN: [id number] ? ?CC: Medical Management of Chronic Issues ? ? ?HPI ?Misty Wilkerson presents for  presents for  follow-up of hypertension. Patient has no history of headache chest pain or shortness of breath or recent cough. Patient also denies symptoms of TIA such as focal numbness or weakness. Patient denies side effects from medication. States taking it regularly. ?Running 122/58 is typical at home at rest. Lightheaded at first with BP lowering is now gone.  ? ?Fosamax caused bad diarrhea.Taking calcium and Vitamin D.  ? ? ?  03/05/2022  ?  8:14 AM 12/06/2021  ?  6:32 PM 12/04/2021  ? 10:54 AM  ?Depression screen PHQ 2/9  ?Decreased Interest 0 0 0  ?Down, Depressed, Hopeless 0 0 0  ?PHQ - 2 Score 0 0 0  ?Altered sleeping  0 0  ?Tired, decreased energy  1 1  ?Change in appetite  0 0  ?Feeling bad or failure about yourself   0 0  ?Trouble concentrating  0 0  ?Moving slowly or fidgety/restless  0 0  ?Suicidal thoughts  0 0  ?PHQ-9 Score  1 1  ?Difficult doing work/chores  Somewhat difficult Somewhat difficult  ? ? ?History ?Misty Wilkerson has a past medical history of Arthritis, Cancer ([place]), H/O hiatal hernia, Hypertension, Personal history of radiation therapy (2002), and Vertigo.  ? ?She has a past surgical history that includes Cholecystectomy; Eye surgery; Partial mastectomy with needle localization and axillary sentinel lymph node bx (Left, 08/12/2013); Re-excision of breast lumpectomy (Left, 08/27/2013); Colonoscopy (N/A, 10/26/2015); Excision mass upper extremeties (Left, 03/25/2018); Debridement and closure wound (Left, 03/25/2018); Breast lumpectomy (Left, 2002, 2014); and Maxillary antrostomy (Left, 12/25/2021).  ? ?Her family history includes Aneurysm in her sister; Breast cancer in her sister; Breast cancer (age of onset: 109) in her sister; Cancer in her brother; Diabetes in her father; Hypertension in her mother; Stroke in her  father.She reports that she quit smoking about 22 years ago. Her smoking use included cigarettes. She has a 7.50 pack-year smoking history. She has never used smokeless tobacco. She reports current alcohol use. She reports that she does not use drugs. ? ? ? ?ROS ?Review of Systems  ?Constitutional: Negative.   ?HENT:  Positive for congestion and postnasal drip. Negative for rhinorrhea, sinus pressure and sinus pain.   ?Eyes:  Negative for visual disturbance.  ?Respiratory:  Positive for cough. Negative for shortness of breath.   ?Cardiovascular:  Negative for chest pain.  ?Gastrointestinal:  Negative for abdominal pain.  ?Musculoskeletal:  Negative for arthralgias.  ? ?Objective:  ?BP (!) 161/59   Pulse 74   Temp 98 ?F (36.7 ?C)   Ht 5' 1.5" (1.562 m)   Wt 166 lb 9.6 oz (75.6 kg)   SpO2 98%   BMI 30.97 kg/m?  ? ?BP Readings from Last 3 Encounters:  ?03/05/22 (!) 161/59  ?12/25/21 (!) 158/67  ?12/04/21 (!) 123/55  ? ? ?Wt Readings from Last 3 Encounters:  ?03/05/22 166 lb 9.6 oz (75.6 kg)  ?12/25/21 163 lb 12.8 oz (74.3 kg)  ?12/06/21 158 lb (71.7 kg)  ? ? ? ?Physical Exam ?Constitutional:   ?   General: She is not in acute distress. ?   Appearance: She is well-developed.  ?HENT:  ?   Right Ear: Tympanic membrane normal.  ?   Left Ear: Tympanic membrane normal.  ?   Nose: Congestion (mild pallor) present.  ?  Mouth/Throat:  ?   Pharynx: No oropharyngeal exudate or posterior oropharyngeal erythema.  ?Cardiovascular:  ?   Rate and Rhythm: Normal rate and regular rhythm.  ?   Pulses: Normal pulses.  ?Pulmonary:  ?   Breath sounds: Normal breath sounds.  ?Musculoskeletal:     ?   General: Normal range of motion.  ?Skin: ?   General: Skin is warm and dry.  ?Neurological:  ?   Mental Status: She is alert and oriented to person, place, and time.  ? ? ? ? ?Assessment & Plan:  ? ?Misty Wilkerson was seen today for medical management of chronic issues. ? ?Diagnoses and all orders for this visit: ? ?Essential hypertension ?-      CBC with Differential/Platelet ?-     CMP14+EGFR ? ?Pure hypercholesterolemia ?-     Lipid panel ? ?Seasonal allergic rhinitis due to pollen ? ?Age-related osteoporosis without current pathological fracture ? ? ?Poor tolerance of Fosamax.  As result she is just taking calcium.  She will add vitamin D.  I also suggested she try Actonel or Prolia.  She wants to think about those maybe do some of her own research and let me know.  Her blood pressure was also elevated.  She assures me that it is good at home.  She just has whitecoat hypertension.  It is always good at home.  As result she is going to do blood pressure readings 3 back-to-back daily for several days and let me know if the readings are over 130/80.  If so we will adjust her blood pressure medicine together. ? ? ? ?I am having Misty Wilkerson maintain her vitamin B-12, cholecalciferol, valsartan, alendronate, Mitigare, ipratropium, and triamterene-hydrochlorothiazide. ? ?Allergies as of 03/05/2022   ? ?   Reactions  ? Penicillins Shortness Of Breath, Rash  ? Tetracyclines & Related Itching  ? ?  ? ?  ?Medication List  ?  ? ?  ? Accurate as of March 05, 2022  9:16 AM. If you have any questions, ask your nurse or doctor.  ?  ?  ? ?  ? ?alendronate 70 MG tablet ?Commonly known as: FOSAMAX ?Take 1 tablet (70 mg total) by mouth every 7 (seven) days. Take with a full glass of water on an empty stomach. ?  ?cholecalciferol 25 MCG (1000 UNIT) tablet ?Commonly known as: VITAMIN D3 ?Take 2 tablets (2,000 Units total) by mouth daily. ?  ?ipratropium 0.06 % nasal spray ?Commonly known as: ATROVENT ?SMARTSIG:2 Spray(s) Both Nares Twice Daily PRN ?  ?Mitigare 0.6 MG Caps ?Generic drug: Colchicine ?Take 1 capsule by mouth 2 (two) times daily. ?  ?triamterene-hydrochlorothiazide 37.5-25 MG tablet ?Commonly known as: MAXZIDE-25 ?Take by mouth. ?  ?valsartan 160 MG tablet ?Commonly known as: DIOVAN ?Take 1 tablet (160 mg total) by mouth daily. for blood pressure ?  ?vitamin  B-12 1000 MCG tablet ?Commonly known as: CYANOCOBALAMIN ?Take 1,000 mcg by mouth daily. ?  ? ?  ? ? ? ?Follow-up: Return in about 6 months (around 09/04/2022). ? ?Claretta Fraise, M.D. ?

## 2022-03-06 NOTE — Progress Notes (Signed)
Hello Misty Wilkerson,  Your lab result is normal and/or stable.Some minor variations that are not significant are commonly marked abnormal, but do not represent any medical problem for you.  Best regards, Nathanyel Defenbaugh, M.D.

## 2022-03-12 ENCOUNTER — Encounter: Payer: Self-pay | Admitting: Family Medicine

## 2022-05-09 ENCOUNTER — Other Ambulatory Visit: Payer: Self-pay | Admitting: Family Medicine

## 2022-05-09 DIAGNOSIS — Z1231 Encounter for screening mammogram for malignant neoplasm of breast: Secondary | ICD-10-CM

## 2022-05-14 ENCOUNTER — Encounter: Payer: Self-pay | Admitting: Family Medicine

## 2022-05-31 DIAGNOSIS — Z961 Presence of intraocular lens: Secondary | ICD-10-CM | POA: Diagnosis not present

## 2022-06-14 ENCOUNTER — Ambulatory Visit
Admission: RE | Admit: 2022-06-14 | Discharge: 2022-06-14 | Disposition: A | Discharge status: Home / Self Care | Payer: Medicare PPO | Source: Ambulatory Visit | Attending: Family Medicine | Admitting: Family Medicine

## 2022-06-14 DIAGNOSIS — Z1231 Encounter for screening mammogram for malignant neoplasm of breast: Secondary | ICD-10-CM

## 2022-07-22 ENCOUNTER — Other Ambulatory Visit: Payer: Self-pay | Admitting: Family Medicine

## 2022-08-01 ENCOUNTER — Encounter: Payer: Self-pay | Admitting: Family Medicine

## 2022-08-01 ENCOUNTER — Ambulatory Visit: Payer: Medicare PPO | Admitting: Family Medicine

## 2022-08-01 VITALS — BP 164/74 | HR 79 | Temp 98.4°F | Ht 61.5 in | Wt 158.6 lb

## 2022-08-01 DIAGNOSIS — J069 Acute upper respiratory infection, unspecified: Secondary | ICD-10-CM

## 2022-08-01 MED ORDER — CHERATUSSIN AC 100-10 MG/5ML PO SOLN: 5.0000 mL | ORAL | 0 refills | Status: DC | PRN

## 2022-08-01 MED ORDER — AZITHROMYCIN 250 MG PO TABS: ORAL_TABLET | ORAL | 0 refills | Status: DC

## 2022-08-01 MED ORDER — COLCHICINE 0.6 MG PO TABS: ORAL_TABLET | ORAL | 2 refills | Status: DC

## 2022-08-01 NOTE — Progress Notes (Signed)
Subjective:  Patient ID: Misty Wilkerson, female    DOB: 07-Jun-1941  Age: 81 y.o. MRN: [id number]  CC: Cough, Nasal Congestion, Chest Congestion, and Fever   HPI  Jelissa Espiritu presents for fever and cough. Onset 10 days ago. Still coughing. Wheezing. Currently having drainage that is clear. Dyspneic, not bad, with cough/  Right 1st toe pain from gout starting 3 days ago. Red, throbbing & hurts to touch. Hurts to even cover with a blanket at night.        08/01/2022   11:05 AM 03/05/2022    8:14 AM 12/06/2021    6:32 PM  Depression screen PHQ 2/9  Decreased Interest 0 0 0  Down, Depressed, Hopeless 0 0 0  PHQ - 2 Score 0 0 0  Altered sleeping   0  Tired, decreased energy   1  Change in appetite   0  Feeling bad or failure about yourself    0  Trouble concentrating   0  Moving slowly or fidgety/restless   0  Suicidal thoughts   0  PHQ-9 Score   1  Difficult doing work/chores   Somewhat difficult    History Jovonna has a past medical history of Arthritis, Cancer ([place]), H/O hiatal hernia, Hypertension, Personal history of radiation therapy (2002), and Vertigo.   She has a past surgical history that includes Cholecystectomy; Eye surgery; Partial mastectomy with needle localization and axillary sentinel lymph node bx (Left, 08/12/2013); Re-excision of breast lumpectomy (Left, 08/27/2013); Colonoscopy (N/A, 10/26/2015); Excision mass upper extremeties (Left, 03/25/2018); Debridement and closure wound (Left, 03/25/2018); Breast lumpectomy (Left, 2002, 2014); and Maxillary antrostomy (Left, 12/25/2021).   Her family history includes Aneurysm in her sister; Breast cancer in her sister; Breast cancer (age of onset: 34) in her sister; Cancer in her brother; Diabetes in her father; Hypertension in her mother; Stroke in her father.She reports that she quit smoking about 23 years ago. Her smoking use included cigarettes. She has a 7.50 pack-year smoking history. She has never used smokeless  tobacco. She reports current alcohol use. She reports that she does not use drugs.    ROS Review of Systems  Constitutional:  Negative for activity change, appetite change, chills and fever.  HENT:  Positive for congestion, postnasal drip, rhinorrhea and sinus pressure. Negative for ear discharge, ear pain, hearing loss, nosebleeds, sneezing and trouble swallowing.   Respiratory:  Negative for chest tightness and shortness of breath.   Cardiovascular:  Negative for chest pain and palpitations.  Skin:  Negative for rash.    Objective:  BP (!) 164/74   Pulse 79   Temp 98.4 F (36.9 C)   Ht 5' 1.5" (1.562 m)   Wt 158 lb 9.6 oz (71.9 kg)   SpO2 98%   BMI 29.48 kg/m   BP Readings from Last 3 Encounters:  08/01/22 (!) 164/74  03/05/22 (!) 161/59  12/25/21 (!) 158/67    Wt Readings from Last 3 Encounters:  08/01/22 158 lb 9.6 oz (71.9 kg)  03/05/22 166 lb 9.6 oz (75.6 kg)  12/25/21 163 lb 12.8 oz (74.3 kg)     Physical Exam Constitutional:      Appearance: She is well-developed.  HENT:     Head: Normocephalic and atraumatic.     Right Ear: Tympanic membrane and external ear normal. No decreased hearing noted.     Left Ear: Tympanic membrane and external ear normal. No decreased hearing noted.     Nose: Mucosal edema present.  Right Sinus: No frontal sinus tenderness.     Left Sinus: No frontal sinus tenderness.     Mouth/Throat:     Pharynx: No oropharyngeal exudate or posterior oropharyngeal erythema.  Neck:     Meningeal: Brudzinski's sign absent.  Pulmonary:     Effort: No respiratory distress.     Breath sounds: Normal breath sounds.  Lymphadenopathy:     Head:     Right side of head: No preauricular adenopathy.     Left side of head: No preauricular adenopathy.     Cervical:     Right cervical: No superficial cervical adenopathy.    Left cervical: No superficial cervical adenopathy.       Assessment & Plan:   Lanisha was seen today for cough, nasal  congestion, chest congestion and fever.  Diagnoses and all orders for this visit:  Upper respiratory infection with cough and congestion -     COVID-19, Flu A+B and RSV -     Uric acid -     CMP14+EGFR -     CBC with Differential/Platelet  Other orders -     colchicine 0.6 MG tablet; Take twice daily for gout attack. (may take every two hours up to 6 doses at acute onset)       I have discontinued Sharan Riner's Mitigare. I am also having her start on colchicine. Additionally, I am having her maintain her cyanocobalamin, cholecalciferol, alendronate, ipratropium, triamterene-hydrochlorothiazide, and valsartan.  Allergies as of 08/01/2022       Reactions   Penicillins Shortness Of Breath, Rash   Tetracyclines & Related Itching        Medication List        Accurate as of August 01, 2022 11:44 AM. If you have any questions, ask your nurse or doctor.          STOP taking these medications    Mitigare 0.6 MG Caps Generic drug: Colchicine Replaced by: colchicine 0.6 MG tablet Stopped by: Claretta Fraise, MD       TAKE these medications    alendronate 70 MG tablet Commonly known as: FOSAMAX Take 1 tablet (70 mg total) by mouth every 7 (seven) days. Take with a full glass of water on an empty stomach.   cholecalciferol 25 MCG (1000 UNIT) tablet Commonly known as: VITAMIN D3 Take 2 tablets (2,000 Units total) by mouth daily.   colchicine 0.6 MG tablet Take twice daily for gout attack. (may take every two hours up to 6 doses at acute onset) Replaces: Mitigare 0.6 MG Caps Started by: Claretta Fraise, MD   cyanocobalamin 1000 MCG tablet Commonly known as: VITAMIN B12 Take 1,000 mcg by mouth daily.   ipratropium 0.06 % nasal spray Commonly known as: ATROVENT SMARTSIG:2 Spray(s) Both Nares Twice Daily PRN   triamterene-hydrochlorothiazide 37.5-25 MG tablet Commonly known as: MAXZIDE-25 Take by mouth.   valsartan 160 MG tablet Commonly known as:  DIOVAN TAKE 1 TABLET BY MOUTH DAILY FOR BLOOD PRESSURE         Follow-up: No follow-ups on file.  Claretta Fraise, M.D.

## 2022-08-02 LAB — CMP14+EGFR
ALT: 14 IU/L (ref 0–32)
AST: 24 IU/L (ref 0–40)
Albumin/Globulin Ratio: 1.6 (ref 1.2–2.2)
Albumin: 4.3 g/dL (ref 3.8–4.8)
Alkaline Phosphatase: 71 IU/L (ref 44–121)
BUN/Creatinine Ratio: 10 — ABNORMAL LOW (ref 12–28)
BUN: 11 mg/dL (ref 8–27)
Bilirubin Total: 0.5 mg/dL (ref 0.0–1.2)
CO2: 24 mmol/L (ref 20–29)
Calcium: 9.5 mg/dL (ref 8.7–10.3)
Chloride: 103 mmol/L (ref 96–106)
Creatinine, Ser: 1.05 mg/dL — ABNORMAL HIGH (ref 0.57–1.00)
Globulin, Total: 2.7 g/dL (ref 1.5–4.5)
Glucose: 93 mg/dL (ref 70–99)
Potassium: 4.7 mmol/L (ref 3.5–5.2)
Sodium: 141 mmol/L (ref 134–144)
Total Protein: 7 g/dL (ref 6.0–8.5)
eGFR: 54 mL/min/{1.73_m2} — ABNORMAL LOW (ref >59)

## 2022-08-02 LAB — CBC WITH DIFFERENTIAL/PLATELET
Basophils Absolute: 0 10*3/uL (ref 0.0–0.2)
Basos: 1 %
EOS (ABSOLUTE): 0.2 10*3/uL (ref 0.0–0.4)
Eos: 3 %
Hematocrit: 39.1 % (ref 34.0–46.6)
Hemoglobin: 13 g/dL (ref 11.1–15.9)
Immature Grans (Abs): 0.1 10*3/uL (ref 0.0–0.1)
Immature Granulocytes: 1 %
Lymphocytes Absolute: 0.9 10*3/uL (ref 0.7–3.1)
Lymphs: 14 %
MCH: 30.3 pg (ref 26.6–33.0)
MCHC: 33.2 g/dL (ref 31.5–35.7)
MCV: 91 fL (ref 79–97)
Monocytes Absolute: 0.4 10*3/uL (ref 0.1–0.9)
Monocytes: 6 %
Neutrophils Absolute: 5 10*3/uL (ref 1.4–7.0)
Neutrophils: 75 %
Platelets: 278 10*3/uL (ref 150–450)
RBC: 4.29 x10E6/uL (ref 3.77–5.28)
RDW: 11.4 % — ABNORMAL LOW (ref 11.7–15.4)
WBC: 6.6 10*3/uL (ref 3.4–10.8)

## 2022-08-02 LAB — COVID-19, FLU A+B AND RSV
Influenza A, NAA: NOT DETECTED
Influenza B, NAA: NOT DETECTED
RSV, NAA: NOT DETECTED
SARS-CoV-2, NAA: NOT DETECTED

## 2022-08-02 LAB — URIC ACID: Uric Acid: 7.1 mg/dL (ref 3.1–7.9)

## 2022-08-02 NOTE — Progress Notes (Signed)
Hello Yakira,  Your lab result is normal and/or stable.Some minor variations that are not significant are commonly marked abnormal, but do not represent any medical problem for you.  Best regards, Claretta Fraise, M.D.

## 2022-08-04 IMAGING — MR MR HEAD WO/W CM
14 of 15 series · 38 of 48 positions shown · IV contrast (7.5 ml Gadavist)
Comparison: None.

CLINICAL DATA: Headache, new or worsening

EXAM:
MRI HEAD WITHOUT AND WITH CONTRAST
TECHNIQUE: Multiplanar, multiecho pulse sequences of the brain and surrounding
structures were obtained without and with intravenous contrast.
CONTRAST:  7.5mL GADAVIST GADOBUTROL 1 MMOL/ML IV SOLN

[Series 5: DWI · axial · 3.0mm · 0.77mm/px · z∈[-61,+81]mm · 2 of 50 slices shown (1 of 4)]
[im 1/50]
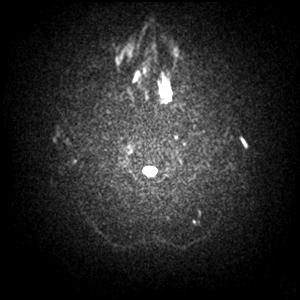
[im 50/50]
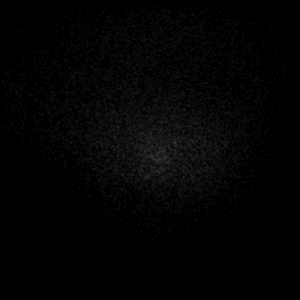

[Series 6: DWI · axial · 3.0mm · 0.77mm/px · z∈[-61,+78]mm · 3 of 49 slices shown (2 of 4)]
[im 1/49]
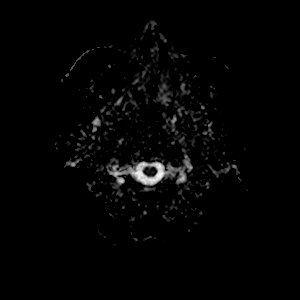
[im 25/49]
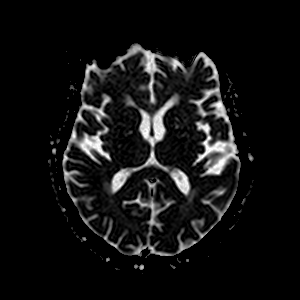
[im 49/49]
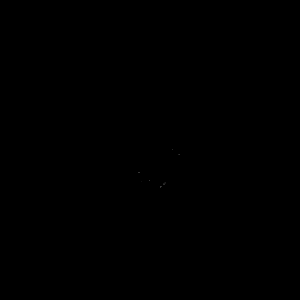

[Series 7: DWI · coronal · 5.0mm · 0.88mm/px · 2 of 28 slices shown (3 of 4)]
[im 1/28]
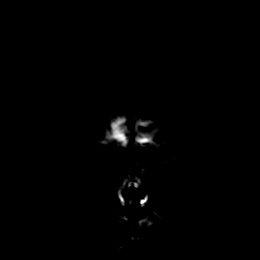
[im 28/28]
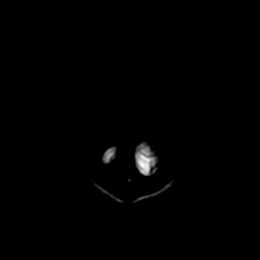

[Series 8: DWI · coronal · 5.0mm · 0.88mm/px · 2 of 28 slices shown (4 of 4)]
[im 1/28]
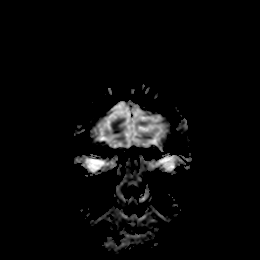
[im 28/28]
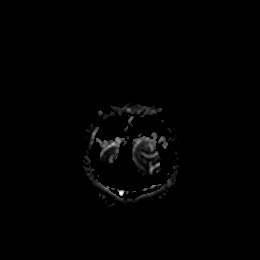

[Series 9: T1 · sagittal · 5.0mm · 0.75mm/px · 1 of 21 slices shown (1 of 2)]
[im 1/21]
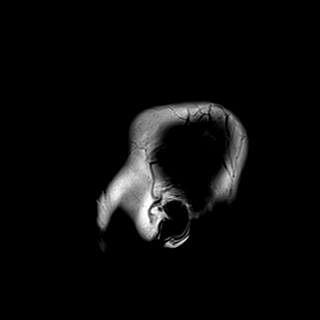

[Series 10: T2 · axial · 5.0mm · 0.72mm/px · 1 of 23 slices shown (1 of 2)]
[im 1/23]
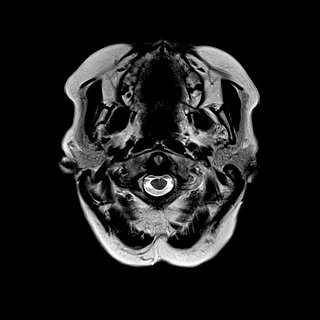

[Series 11: mag_images · axial · 3.0mm · 0.90mm/px · z∈[-78,+92]mm · 3 of 60 slices shown]
[im 1/60]
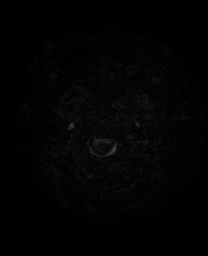
[im 30/60]
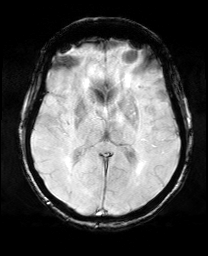
[im 60/60]
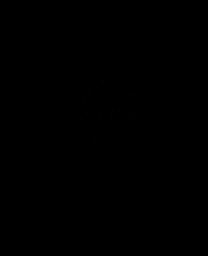

[Series 12: pha_images · axial · 3.0mm · 0.90mm/px · z∈[-78,+90]mm · 3 of 57 slices shown]
[im 1/57]
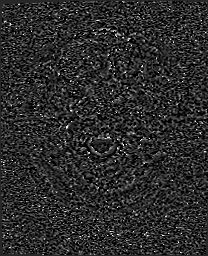
[im 29/57]
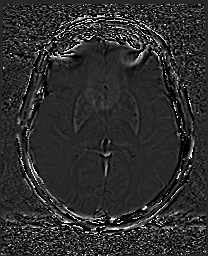
[im 57/57]
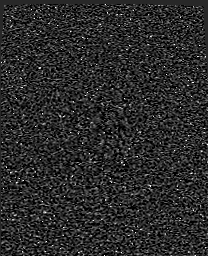

[Series 13: swi_images · axial · 3.0mm · 0.90mm/px · z∈[-78,+92]mm · 3 of 60 slices shown]
[im 1/60]
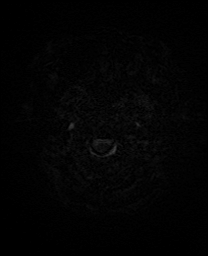
[im 30/60]
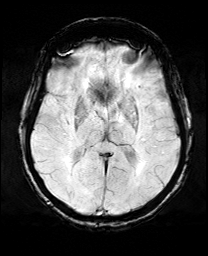
[im 60/60]
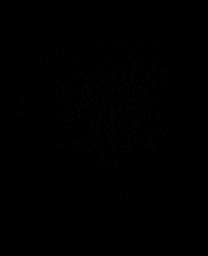

[Series 15: FLAIR · axial · 3.0mm · 0.45mm/px · z∈[-64,+78]mm · 3 of 50 slices shown]
[im 1/50]
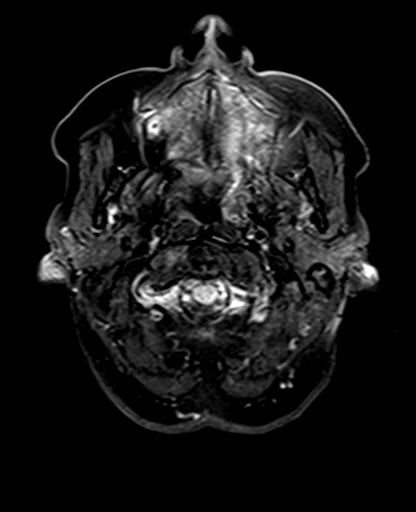
[im 25/50]
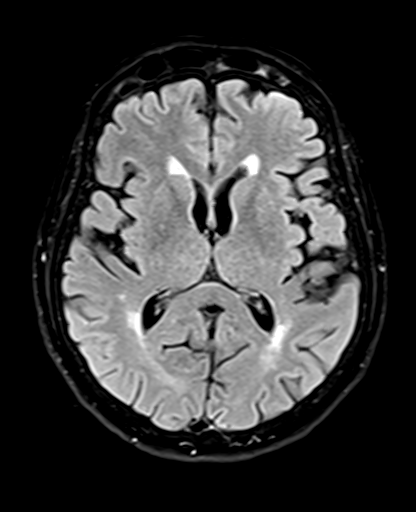
[im 50/50]
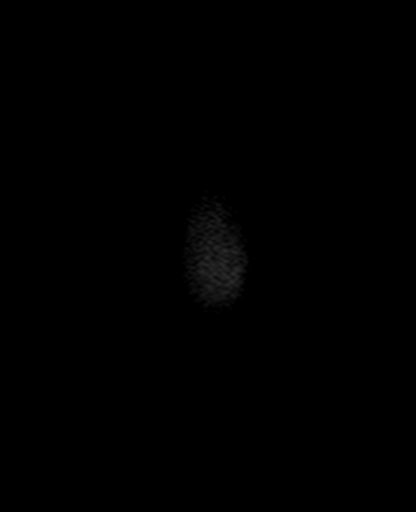

[Series 16: T1 · axial · 1.0mm · 0.98mm/px · z∈[-79,+88]mm · 10 of 173 slices shown (2 of 2)]
[im 1/173]
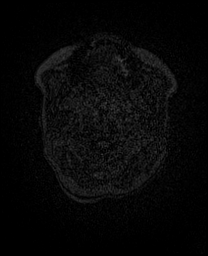
[im 20/173]
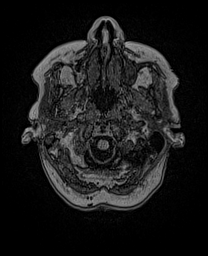
[im 39/173]
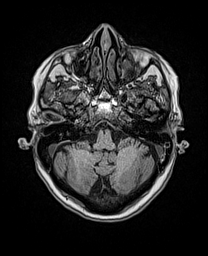
[im 58/173]
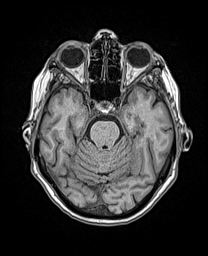
[im 77/173]
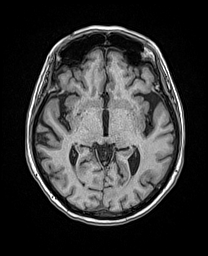
[im 96/173]
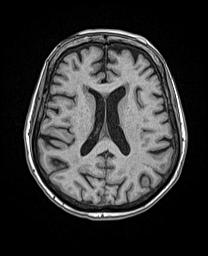
[im 115/173]
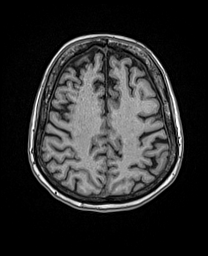
[im 134/173]
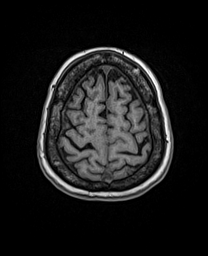
[im 153/173]
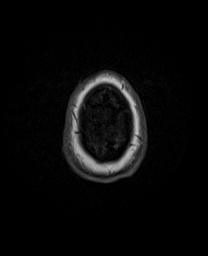
[im 173/173]
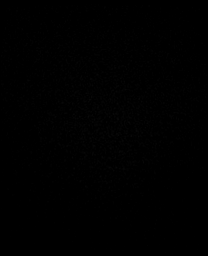

[Series 17: T2 · coronal · 5.0mm · 0.72mm/px · 2 of 28 slices shown (2 of 2)]
[im 1/28]
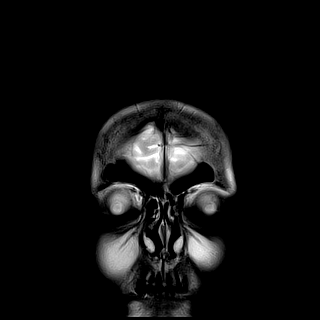
[im 28/28]
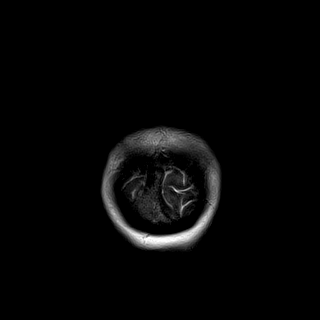

[Series 19: T1 post-contrast · coronal · 5.0mm · 0.34mm/px · 2 of 28 slices shown (1 of 2)]
[im 1/28]
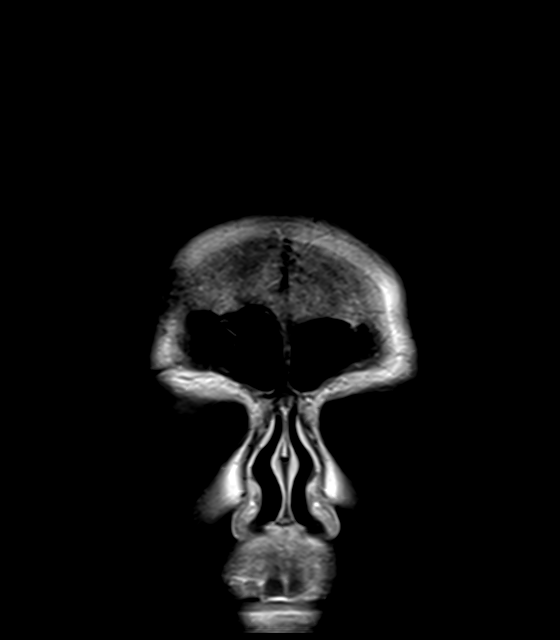
[im 28/28]
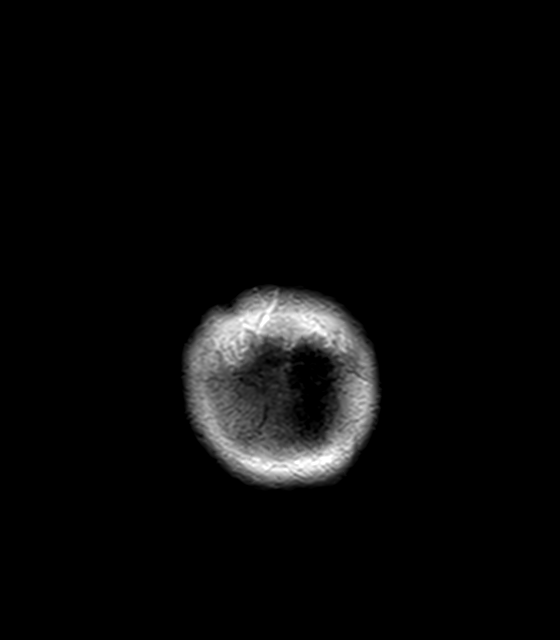

[Series 20: T1 post-contrast · sagittal · 5.0mm · 0.75mm/px · 1 of 21 slices shown (2 of 2)]
[im 1/21]
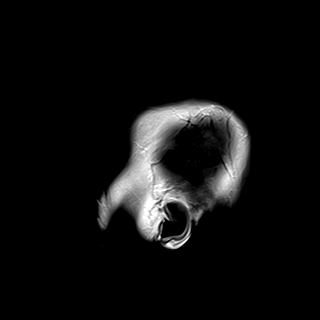

[38 of 48 positions shown; findings below may reference images not displayed]

FINDINGS: Brain: No restricted diffusion to suggest acute or subacute infarct.
No acute hemorrhage, mass, mass effect, or midline shift. Small
right posterior fossa arachnoid cyst. No other extra-axial
collection. No hydrocephalus. Cerebral volume is within normal
limits for age. Scattered T2 hyperintense signal in the
periventricular white matter, likely the sequela of mild-to-moderate
no abnormal enhancement. Chronic small vessel ischemic disease.

Vascular: Normal flow voids.

Skull and upper cervical spine: Normal marrow signal.

Sinuses/Orbits: Large mucous retention cyst in the left maxillary
sinus. The sinuses are otherwise mostly clear. Status post bilateral
lens replacements.

Other: None.
IMPRESSION: No acute intracranial process. No abnormal enhancement. No etiology
is seen for the patient's headaches.

## 2022-08-16 DIAGNOSIS — J338 Other polyp of sinus: Secondary | ICD-10-CM | POA: Diagnosis not present

## 2022-08-16 DIAGNOSIS — J320 Chronic maxillary sinusitis: Secondary | ICD-10-CM | POA: Diagnosis not present

## 2022-08-20 ENCOUNTER — Encounter: Payer: Self-pay | Admitting: Family Medicine

## 2022-08-20 ENCOUNTER — Ambulatory Visit: Payer: Medicare PPO | Admitting: Family Medicine

## 2022-08-20 VITALS — BP 176/74 | HR 78 | Temp 98.1°F | Ht 61.5 in | Wt 162.0 lb

## 2022-08-20 DIAGNOSIS — Z23 Encounter for immunization: Secondary | ICD-10-CM

## 2022-08-20 DIAGNOSIS — M810 Age-related osteoporosis without current pathological fracture: Secondary | ICD-10-CM

## 2022-08-20 DIAGNOSIS — E559 Vitamin D deficiency, unspecified: Secondary | ICD-10-CM | POA: Diagnosis not present

## 2022-08-20 DIAGNOSIS — I10 Essential (primary) hypertension: Secondary | ICD-10-CM | POA: Diagnosis not present

## 2022-08-20 NOTE — Progress Notes (Signed)
Subjective:  Patient ID: Misty Wilkerson, female    DOB: February 04, 1941  Age: 81 y.o. MRN: [id number]  CC: Medical Management of Chronic Issues   HPI Sharyah Bostwick presents for  follow-up of hypertension. Patient has no history of headache chest pain or shortness of breath or recent cough. Patient also denies symptoms of TIA such as focal numbness or weakness. Patient denies side effects from medication. States taking it regularly. Pt. Couldn't tolerate side effects of fosamax. Not on med.  Interested in possibilities.   History Sharlon has a past medical history of Arthritis, Cancer ([place]), H/O hiatal hernia, Hypertension, Personal history of radiation therapy (2002), and Vertigo.   She has a past surgical history that includes Cholecystectomy; Eye surgery; Partial mastectomy with needle localization and axillary sentinel lymph node bx (Left, 08/12/2013); Re-excision of breast lumpectomy (Left, 08/27/2013); Colonoscopy (N/A, 10/26/2015); Excision mass upper extremeties (Left, 03/25/2018); Debridement and closure wound (Left, 03/25/2018); Breast lumpectomy (Left, 2002, 2014); and Maxillary antrostomy (Left, 12/25/2021).   Her family history includes Aneurysm in her sister; Breast cancer in her sister; Breast cancer (age of onset: 21) in her sister; Cancer in her brother; Diabetes in her father; Hypertension in her mother; Stroke in her father.She reports that she quit smoking about 23 years ago. Her smoking use included cigarettes. She has a 7.50 pack-year smoking history. She has never used smokeless tobacco. She reports current alcohol use. She reports that she does not use drugs.  Current Outpatient Medications on File Prior to Visit  Medication Sig Dispense Refill   cholecalciferol (VITAMIN D3) 25 MCG (1000 UNIT) tablet Take 2 tablets (2,000 Units total) by mouth daily.     valsartan (DIOVAN) 160 MG tablet TAKE 1 TABLET BY MOUTH DAILY FOR BLOOD PRESSURE 90 tablet 0   vitamin B-12  (CYANOCOBALAMIN) 1000 MCG tablet Take 1,000 mcg by mouth daily.     No current facility-administered medications on file prior to visit.    ROS Review of Systems  Constitutional: Negative.   HENT: Negative.    Eyes:  Negative for visual disturbance.  Respiratory:  Negative for shortness of breath.   Cardiovascular:  Negative for chest pain.  Gastrointestinal:  Negative for abdominal pain.  Musculoskeletal:  Negative for arthralgias.    Objective:  BP (!) 176/74   Pulse 78   Temp 98.1 F (36.7 C)   Ht 5' 1.5" (1.562 m)   Wt 162 lb (73.5 kg)   SpO2 98%   BMI 30.11 kg/m   BP Readings from Last 3 Encounters:  08/20/22 (!) 176/74  08/01/22 (!) 164/74  03/05/22 (!) 161/59    Wt Readings from Last 3 Encounters:  08/20/22 162 lb (73.5 kg)  08/01/22 158 lb 9.6 oz (71.9 kg)  03/05/22 166 lb 9.6 oz (75.6 kg)     Physical Exam Constitutional:      General: She is not in acute distress.    Appearance: She is well-developed.  Cardiovascular:     Rate and Rhythm: Normal rate and regular rhythm.  Pulmonary:     Breath sounds: Normal breath sounds.  Musculoskeletal:        General: Normal range of motion.  Skin:    General: Skin is warm and dry.  Neurological:     Mental Status: She is alert and oriented to person, place, and time.       Assessment & Plan:   There are no diagnoses linked to this encounter. Allergies as of 08/20/2022  Reactions   Penicillins Shortness Of Breath, Rash   Tetracyclines & Related Itching        Medication List        Accurate as of August 20, 2022  8:32 AM. If you have any questions, ask your nurse or doctor.          STOP taking these medications    alendronate 70 MG tablet Commonly known as: FOSAMAX Stopped by: Claretta Fraise, MD   azithromycin 250 MG tablet Commonly known as: Zithromax Z-Pak Stopped by: Claretta Fraise, MD   Cheratussin AC 100-10 MG/5ML syrup Generic drug: guaiFENesin-codeine Stopped by:  Claretta Fraise, MD   colchicine 0.6 MG tablet Stopped by: Claretta Fraise, MD   ipratropium 0.06 % nasal spray Commonly known as: ATROVENT Stopped by: Claretta Fraise, MD   triamterene-hydrochlorothiazide 37.5-25 MG tablet Commonly known as: MAXZIDE-25 Stopped by: Claretta Fraise, MD       TAKE these medications    cholecalciferol 25 MCG (1000 UNIT) tablet Commonly known as: VITAMIN D3 Take 2 tablets (2,000 Units total) by mouth daily.   cyanocobalamin 1000 MCG tablet Commonly known as: VITAMIN B12 Take 1,000 mcg by mouth daily.   valsartan 160 MG tablet Commonly known as: DIOVAN TAKE 1 TABLET BY MOUTH DAILY FOR BLOOD PRESSURE        No orders of the defined types were placed in this encounter.   Resume HCTZ? Triam 1/2 tab a day  Follow-up: Return in about 6 months (around 02/18/2023).  Claretta Fraise, M.D.

## 2022-09-02 DIAGNOSIS — I10 Essential (primary) hypertension: Secondary | ICD-10-CM | POA: Diagnosis not present

## 2022-09-02 DIAGNOSIS — S20219A Contusion of unspecified front wall of thorax, initial encounter: Secondary | ICD-10-CM | POA: Diagnosis not present

## 2022-09-02 DIAGNOSIS — R6889 Other general symptoms and signs: Secondary | ICD-10-CM | POA: Diagnosis not present

## 2022-09-02 DIAGNOSIS — R079 Chest pain, unspecified: Secondary | ICD-10-CM | POA: Diagnosis not present

## 2022-09-02 DIAGNOSIS — R072 Precordial pain: Secondary | ICD-10-CM | POA: Diagnosis not present

## 2022-09-03 ENCOUNTER — Encounter: Payer: Self-pay | Admitting: Family Medicine

## 2022-09-05 ENCOUNTER — Ambulatory Visit: Payer: Medicare PPO | Admitting: Family Medicine

## 2022-09-05 ENCOUNTER — Encounter: Payer: Self-pay | Admitting: Family Medicine

## 2022-09-05 ENCOUNTER — Telehealth: Payer: Self-pay | Admitting: Family Medicine

## 2022-09-05 DIAGNOSIS — R0789 Other chest pain: Secondary | ICD-10-CM

## 2022-09-05 NOTE — Telephone Encounter (Signed)
Was seen in office today

## 2022-09-05 NOTE — Telephone Encounter (Signed)
I would be happy to see her>

## 2022-09-05 NOTE — Patient Instructions (Signed)
[address] [place],  [place]  [id number] [address]: [phone]
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
## 2022-09-06 ENCOUNTER — Ambulatory Visit (HOSPITAL_COMMUNITY)
Admission: RE | Admit: 2022-09-06 | Discharge: 2022-09-06 | Disposition: A | Discharge status: Home / Self Care | Payer: Medicare PPO | Source: Ambulatory Visit | Attending: Family Medicine | Admitting: Family Medicine

## 2022-09-06 DIAGNOSIS — Z9012 Acquired absence of left breast and nipple: Secondary | ICD-10-CM | POA: Insufficient documentation

## 2022-09-06 DIAGNOSIS — R911 Solitary pulmonary nodule: Secondary | ICD-10-CM | POA: Diagnosis not present

## 2022-09-06 DIAGNOSIS — R0789 Other chest pain: Secondary | ICD-10-CM | POA: Diagnosis not present

## 2022-09-06 DIAGNOSIS — R079 Chest pain, unspecified: Secondary | ICD-10-CM | POA: Diagnosis not present

## 2022-09-06 MED ORDER — IOHEXOL 300 MG/ML  SOLN
75.0000 mL | Freq: Once | INTRAMUSCULAR | Status: AC | PRN
  Administered 2022-09-06: 75 mL via INTRAVENOUS

## 2022-09-06 NOTE — Progress Notes (Signed)
Patient's husband calling to check on results. Please call back.

## 2022-09-12 ENCOUNTER — Ambulatory Visit (INDEPENDENT_AMBULATORY_CARE_PROVIDER_SITE_OTHER): Payer: Medicare PPO

## 2022-09-12 ENCOUNTER — Encounter: Payer: Self-pay | Admitting: Family Medicine

## 2022-09-12 ENCOUNTER — Ambulatory Visit (INDEPENDENT_AMBULATORY_CARE_PROVIDER_SITE_OTHER): Payer: Medicare PPO | Admitting: Family Medicine

## 2022-09-12 VITALS — BP 156/66 | HR 81 | Temp 97.9°F | Ht 61.5 in | Wt 159.4 lb

## 2022-09-12 DIAGNOSIS — M79671 Pain in right foot: Secondary | ICD-10-CM

## 2022-09-12 MED ORDER — TRAMADOL HCL 50 MG PO TABS: 50.0000 mg | ORAL_TABLET | Freq: Four times a day (QID) | ORAL | 0 refills | Status: AC | PRN
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
## 2022-09-15 ENCOUNTER — Encounter: Payer: Self-pay | Admitting: Family Medicine

## 2022-09-16 ENCOUNTER — Encounter: Payer: Self-pay | Admitting: Family Medicine

## 2022-09-17 DIAGNOSIS — M79671 Pain in right foot: Secondary | ICD-10-CM | POA: Diagnosis not present

## 2022-09-17 NOTE — Telephone Encounter (Signed)
I discussed with Misty Wilkerson. She will drop by for a post op shoe later today.

## 2022-10-03 ENCOUNTER — Encounter: Payer: Self-pay | Admitting: Family Medicine

## 2022-10-10 ENCOUNTER — Other Ambulatory Visit: Payer: Self-pay | Admitting: Family Medicine

## 2022-10-12 ENCOUNTER — Encounter (INDEPENDENT_AMBULATORY_CARE_PROVIDER_SITE_OTHER): Payer: Self-pay | Admitting: *Deleted

## 2022-10-15 ENCOUNTER — Ambulatory Visit (INDEPENDENT_AMBULATORY_CARE_PROVIDER_SITE_OTHER): Payer: Medicare PPO

## 2022-10-15 ENCOUNTER — Encounter: Payer: Self-pay | Admitting: Family Medicine

## 2022-10-15 ENCOUNTER — Ambulatory Visit: Payer: Medicare PPO | Admitting: Family Medicine

## 2022-10-15 VITALS — BP 131/56 | HR 82 | Temp 97.5°F | Ht 61.5 in | Wt 159.6 lb

## 2022-10-15 DIAGNOSIS — M79671 Pain in right foot: Secondary | ICD-10-CM

## 2022-10-15 DIAGNOSIS — R051 Acute cough: Secondary | ICD-10-CM | POA: Diagnosis not present

## 2022-10-15 DIAGNOSIS — M1711 Unilateral primary osteoarthritis, right knee: Secondary | ICD-10-CM | POA: Diagnosis not present

## 2022-10-15 DIAGNOSIS — S83411A Sprain of medial collateral ligament of right knee, initial encounter: Secondary | ICD-10-CM

## 2022-10-15 DIAGNOSIS — S92501D Displaced unspecified fracture of right lesser toe(s), subsequent encounter for fracture with routine healing: Secondary | ICD-10-CM | POA: Diagnosis not present

## 2022-10-15 DIAGNOSIS — M25561 Pain in right knee: Secondary | ICD-10-CM | POA: Diagnosis not present

## 2022-10-15 MED ORDER — BENZONATATE 200 MG PO CAPS: 200.0000 mg | ORAL_CAPSULE | Freq: Three times a day (TID) | ORAL | 1 refills | Status: DC | PRN

## 2022-10-15 NOTE — Progress Notes (Signed)
Subjective:  Patient ID: Misty Wilkerson, female    DOB: 1940-11-30  Age: 81 y.o. MRN: [id number]  CC: Knee Pain (right)   HPI Misty Wilkerson presents for walking with a broken toe, wearing the post op shoe. This has led to increasing pain at the medial aspect of the left knee. Worse with ambulation. Misty Wilkerson Dced the post op shoe last week. No better.      10/15/2022    2:33 PM 09/12/2022    2:53 PM 09/12/2022    2:35 PM  Depression screen PHQ 2/9  Decreased Interest 0 1 0  Down, Depressed, Hopeless 0 1 0  PHQ - 2 Score 0 2 0  Altered sleeping 2 1   Tired, decreased energy 1 1   Change in appetite 0 1   Feeling bad or failure about yourself  0 0   Trouble concentrating 0 2   Moving slowly or fidgety/restless 0 2   Suicidal thoughts 0 0   PHQ-9 Score 3 9   Difficult doing work/chores Somewhat difficult Very difficult     History Misty Wilkerson has a past medical history of Arthritis, Cancer ([place]), H/O hiatal hernia, Hypertension, Personal history of radiation therapy (2002), and Vertigo.   Misty Wilkerson has a past surgical history that includes Cholecystectomy; Eye surgery; Partial mastectomy with needle localization and axillary sentinel lymph node bx (Left, 08/12/2013); Re-excision of breast lumpectomy (Left, 08/27/2013); Colonoscopy (N/A, 10/26/2015); Excision mass upper extremeties (Left, 03/25/2018); Debridement and closure wound (Left, 03/25/2018); Breast lumpectomy (Left, 2002, 2014); and Maxillary antrostomy (Left, 12/25/2021).   Her family history includes Aneurysm in her sister; Breast cancer in her sister; Breast cancer (age of onset: 27) in her sister; Cancer in her brother; Diabetes in her father; Hypertension in her mother; Stroke in her father.Misty Wilkerson reports that Misty Wilkerson quit smoking about 23 years ago. Her smoking use included cigarettes. Misty Wilkerson has a 7.50 pack-year smoking history. Misty Wilkerson has never used smokeless tobacco. Misty Wilkerson reports current alcohol use. Misty Wilkerson reports that Misty Wilkerson does not use  drugs.    ROS Review of Systems  Constitutional:  Negative for fatigue and fever.  Respiratory:  Positive for cough.   Musculoskeletal:  Positive for gait problem (antalgic).    Objective:  BP (!) 131/56   Pulse 82   Temp (!) 97.5 F (36.4 C)   Ht 5' 1.5" (1.562 m)   Wt 159 lb 9.6 oz (72.4 kg)   SpO2 100%   BMI 29.67 kg/m   BP Readings from Last 3 Encounters:  10/15/22 (!) 131/56  09/12/22 (!) 156/66  09/05/22 (!) 149/59    Wt Readings from Last 3 Encounters:  10/15/22 159 lb 9.6 oz (72.4 kg)  09/12/22 159 lb 6.4 oz (72.3 kg)  09/05/22 161 lb 4 oz (73.1 kg)     Physical Exam Constitutional:      General: Misty Wilkerson is not in acute distress.    Appearance: Normal appearance. Misty Wilkerson is well-developed. Misty Wilkerson is not ill-appearing.  HENT:     Head: Normocephalic and atraumatic.  Cardiovascular:     Rate and Rhythm: Normal rate and regular rhythm.  Pulmonary:     Breath sounds: Normal breath sounds.  Musculoskeletal:        General: Tenderness (medial collateral of right knee. Negative McMurray [employer]er. Positive for varus stress) present. Normal range of motion.  Skin:    General: Skin is warm and dry.  Neurological:     Mental Status: Misty Wilkerson is alert and oriented to person, place, and time.  XR - some callus at the previously noted Fx - medial aspect, proximal right third toe.   XR knee - nonacute  Preliminary reading done by Randell Loop    Assessment & Plan:   Misty Wilkerson was seen today for knee pain.  Diagnoses and all orders for this visit:  Sprain of medial collateral ligament of right knee, initial encounter  Acute pain of right knee -     DG Knee 1-2 Views Right; Future  Right foot pain -     DG Foot Complete Right; Future  Acute cough  Other orders -     benzonatate (TESSALON) 200 MG capsule; Take 1 capsule (200 mg total) by mouth 3 (three) times daily as needed for cough.   Hinged brace for knee. Resume post op shoe.     I am having Misty Wilkerson start on benzonatate. I am also having her maintain her cyanocobalamin, cholecalciferol, valsartan, and triamterene-hydrochlorothiazide.  Allergies as of 10/15/2022       Reactions   Penicillins Shortness Of Breath, Rash   Tetracyclines & Related Itching        Medication List        Accurate as of October 15, 2022  7:08 PM. If you have any questions, ask your nurse or doctor.          benzonatate 200 MG capsule Commonly known as: TESSALON Take 1 capsule (200 mg total) by mouth 3 (three) times daily as needed for cough. Started by: Claretta Fraise, MD   cholecalciferol 25 MCG (1000 UNIT) tablet Commonly known as: VITAMIN D3 Take 2 tablets (2,000 Units total) by mouth daily.   cyanocobalamin 1000 MCG tablet Commonly known as: VITAMIN B12 Take 1,000 mcg by mouth daily.   triamterene-hydrochlorothiazide 37.5-25 MG tablet Commonly known as: MAXZIDE-25 TAKE 1 TABLET BY MOUTH EVERY DAY   valsartan 160 MG tablet Commonly known as: DIOVAN TAKE 1 TABLET BY MOUTH DAILY FOR BLOOD PRESSURE         Follow-up: Return in about 1 month (around 11/14/2022), or if symptoms worsen or fail to improve.  Claretta Fraise, M.D.

## 2022-11-03 ENCOUNTER — Other Ambulatory Visit: Payer: Self-pay | Admitting: Family Medicine

## 2022-11-06 ENCOUNTER — Encounter: Payer: Self-pay | Admitting: Family Medicine

## 2022-12-07 ENCOUNTER — Ambulatory Visit (INDEPENDENT_AMBULATORY_CARE_PROVIDER_SITE_OTHER): Payer: Medicare PPO

## 2022-12-07 VITALS — Ht 61.0 in | Wt 159.0 lb

## 2022-12-07 DIAGNOSIS — Z0000 Encounter for general adult medical examination without abnormal findings: Secondary | ICD-10-CM | POA: Diagnosis not present

## 2022-12-07 NOTE — Progress Notes (Signed)
Subjective:   Misty Wilkerson is a 82 y.o. female who presents for Medicare Annual (Subsequent) preventive examination. I connected with  Misty Wilkerson on 12/07/22 by a audio enabled telemedicine application and verified that I am speaking with the correct person using two identifiers.  Patient Location: Home  Provider Location: Home Office  I discussed the limitations of evaluation and management by telemedicine. The patient expressed understanding and agreed to proceed.  Review of Systems     Cardiac Risk Factors include: advanced age (>4mn, >>28women)     Objective:    Today's Vitals   12/07/22 1113  Weight: 159 lb (72.1 kg)  Height: '5\' 1"'$  (1.549 m)   Body mass index is 30.04 kg/m.     12/07/2022   11:16 AM 12/25/2021    6:35 AM 12/12/2021    2:31 PM 12/06/2021    6:35 PM 09/19/2021    7:33 PM 03/25/2018    9:56 AM 03/07/2018    3:30 PM  Advanced Directives  Does Patient Have a Medical Advance Directive? Yes No No No No No No  [place] of [place]        Copy of [place]? No - copy requested No - copy requested No - copy requested      Would patient like information on creating a medical advance directive?  No - Patient declined No - Patient declined No - Patient declined No - Patient declined No - Patient declined No - Patient declined    Current Medications (verified) Outpatient Encounter Medications as of 12/07/2022  Medication Sig   benzonatate (TESSALON) 200 MG capsule Take 1 capsule (200 mg total) by mouth 3 (three) times daily as needed for cough.   cholecalciferol (VITAMIN D3) 25 MCG (1000 UNIT) tablet Take 2 tablets (2,000 Units total) by mouth daily.   triamterene-hydrochlorothiazide (MAXZIDE-25) 37.5-25 MG tablet TAKE 1 TABLET BY MOUTH EVERY DAY   valsartan (DIOVAN) 160 MG tablet TAKE 1 TABLET BY MOUTH DAILY FOR BLOOD PRESSURE   vitamin B-12 (CYANOCOBALAMIN) 1000 MCG tablet  Take 1,000 mcg by mouth daily.   No facility-administered encounter medications on file as of 12/07/2022.    Allergies (verified) Penicillins and Tetracyclines & related   History: Past Medical History:  Diagnosis Date   Arthritis    [place]    [place]   H/O hiatal hernia    eats slowly  now   Hypertension    Personal history of radiation therapy 2002   left breast   Vertigo    Past Surgical History:  Procedure Laterality Date   BREAST LUMPECTOMY Left 2002, 2014   CHOLECYSTECTOMY     COLONOSCOPY N/A 10/26/2015   Procedure: COLONOSCOPY;  Surgeon: NRogene Houston MD;  [facility];  Service: Endoscopy;  Laterality: N/A;  [place] Left 03/25/[place]   Procedure: DEBRIDEMENTINTERPHALANGEAL LEFT INDEX FINGER;  Surgeon: KDaryll Brod MD;  [facility] [facility]  Service: Orthopedics;  Laterality: Left;   EXCISION MASS UPPER EXTREMETIES Left 03/25/2018   Procedure: EXCISION MASS LEFT INDEX;  Surgeon: KDaryll Brod MD;  [facility]  Service: Orthopedics;  Laterality: Left;   EYE SURGERY     bilateral cataracts   MAXILLARY ANTROSTOMY Left 12/25/2021   Procedure: LEFT MAXILLARY ANTROSTOMY WITH TISSUE REMOVAL;  Surgeon: TLeta Baptist MD;  [place] [facility]  Service: ENT;  Laterality: Left;   PARTIAL  MASTECTOMY WITH NEEDLE LOCALIZATION AND AXILLARY SENTINEL LYMPH NODE BX Left 08/12/2013   Procedure: LEFT PARTIAL MASTECTOMY WITH NEEDLE LOCALIZATION ATTEMPTED AXILLARY SENTINEL LYMPH NODE BIOPSY;  Surgeon: Rolm Bookbinder, MD;  Location: [facility];  Service: General;  Laterality: Left;   RE-EXCISION OF BREAST LUMPECTOMY Left 08/27/2013   Procedure: RE-EXCISION OF BREAST LUMPECTOMY;  Surgeon: Rolm Bookbinder, MD;  [facility];  Service: General;  Laterality: Left;   Family History  Problem Relation Age of Onset   Hypertension Mother    Diabetes Father    Stroke Father    Breast cancer Sister  22   Breast cancer Sister    Aneurysm Sister    Cancer Brother    Social History   Socioeconomic History   Marital status: Married    Spouse name: Misty Wilkerson   Number of children: 2   Years of education: Not on file   Highest education level: Not on file  Occupational History   Occupation: retired  Tobacco Use   Smoking status: Former    Packs/day: 0.50    Years: 15.00    Total pack years: 7.50    Types: Cigarettes    Quit date: 07/22/1999    Years since quitting: 23.3   Smokeless tobacco: Never  Vaping Use   Vaping Use: Never used  Substance and Sexual Activity   Alcohol use: Yes    Comment: one alcohol drink daily   Drug use: No   Sexual activity: Yes  Other Topics Concern   Not on file  Social History Narrative   Very independent and pretty active   Social Determinants of Health   Financial Resource Strain: Low Risk  (12/07/2022)   Overall Financial Resource Strain (CARDIA)    Difficulty of Paying Living Expenses: Not hard at all  Food Insecurity: No Food Insecurity (12/07/2022)   Hunger Vital Sign    Worried About Running Out of Food in the Last Year: Never true    [place] in the Last Year: Never true  Transportation Needs: No Transportation Needs (12/07/2022)   PRAPARE - Hydrologist (Medical): No    Lack of Transportation (Non-Medical): No  Physical Activity: Insufficiently Active (12/07/2022)   Exercise Vital Sign    Days of Exercise per Week: 3 days    Minutes of Exercise per Session: 30 min  Stress: No Stress Concern Present (12/07/2022)   [place]    Feeling of Stress : Not at all  Social Connections: [place] (12/07/2022)   Social Connection and Isolation Panel [NHANES]    Frequency of Communication with Friends and Family: More than three times a week    Frequency of Social Gatherings with Friends and Family: More than three times a week     Attends Religious Services: More than 4 times per year    Active Member of [employer] or Organizations: Yes    Attends Music therapist: More than 4 times per year    Marital Status: Married    Tobacco Counseling Counseling given: Not Answered   Clinical Intake:  Pre-visit preparation completed: Yes  Pain : No/denies pain     Nutritional Risks: None Diabetes: No  How often do you need to have someone help you when you read instructions, pamphlets, or other written materials from your doctor or pharmacy?: 1 - Never  Diabetic?no   Interpreter Needed?: No  Information entered by :: Jadene Pierini, LPN  Activities of Daily Living    12/07/2022   11:17 AM 12/25/2021    6:40 AM  In your present state of health, do you have any difficulty performing the following activities:  Hearing? 0 0  Vision? 0 0  Difficulty concentrating or making decisions? 0 0  Walking or climbing stairs? 0 0  Dressing or bathing? 0 0  Doing errands, shopping? 0   Preparing Food and eating ? N   Using the Toilet? N   In the past six months, have you accidently leaked urine? N   Do you have problems with loss of bowel control? N   Managing your Medications? N   Managing your Finances? N   Housekeeping or managing your Housekeeping? N     Patient Care Team: Claretta Fraise, MD as PCP - General (Family Medicine)  Indicate any recent Medical Services you may have received from other than [facility] providers in the past year (date may be approximate).     Assessment:   This is a routine wellness examination for [place].  Hearing/Vision screen Vision Screening - Comments:: Wears rx glasses - up to date with routine eye exams with  Dr.Shipiro  Dietary issues and exercise activities discussed: Current Exercise Habits: Home exercise routine, Type of exercise: walking, Time (Minutes): 30, Frequency (Times/Week): 3, Weekly Exercise (Minutes/Week): 90, Intensity: Mild, Exercise limited by:  orthopedic condition(s)   Goals Addressed             This Visit's Progress    Exercise 150 min/wk Moderate Activity   On track      Depression Screen    12/07/2022   11:15 AM 10/15/2022    2:33 PM 09/12/2022    2:53 PM 09/12/2022    2:35 PM 09/05/2022    1:26 PM 08/20/2022    7:58 AM 08/01/2022   11:05 AM  PHQ 2/9 Scores  PHQ - 2 Score 0 0 2 0 0 0 0  PHQ- 9 Score 0 '3 9  5      '$ Fall Risk    12/07/2022   11:14 AM 10/15/2022    2:33 PM 09/12/2022    2:35 PM 09/05/2022    1:25 PM 08/20/2022    7:58 AM  Fall Risk   Falls in the past year? 0 0 0 0 0  Number falls in past yr: 0      Injury with Fall? 0      Risk for fall due to : No Fall Risks      Follow up Falls prevention discussed        [place]:  Any stairs in or around the home? Yes  If so, are there any without handrails? No  Home free of loose throw rugs in walkways, pet beds, electrical cords, etc? Yes  Adequate lighting in your home to reduce risk of falls? Yes   ASSISTIVE DEVICES UTILIZED TO PREVENT FALLS:  Life alert? No  Use of a cane, walker or w/c? No  Grab bars in the bathroom? Yes  Shower chair or bench in shower? Yes  Elevated toilet seat or a handicapped toilet? Yes          12/07/2022   11:17 AM 12/06/2021    6:50 PM  6CIT Screen  What Year? 0 points 0 points  What month? 0 points 0 points  What time? 0 points 0 points  Count back from 20 0 points 0 points  Months in reverse 0 points  0 points  Repeat phrase 0 points 0 points  Total Score 0 points 0 points    Immunizations Immunization History  Administered Date(s) Administered   Fluad Quad(high Dose 65+) 08/20/2022   Hepatitis B 08/13/1995, 09/17/1995   Influenza Split 08/14/2021   Influenza, High Dose Seasonal PF 09/08/2018   Influenza,inj,quad, With Preservative 10/05/2017   Moderna SARS-COV2 Booster Vaccination 09/28/2020, 08/15/2021   Moderna Sars-Covid-2 Vaccination 12/08/2019, 01/17/2021    Pneumococcal Conjugate-13 03/16/2014   Pneumococcal Polysaccharide-23 09/26/2009   Zoster Recombinat (Shingrix) 06/19/2019, 11/25/2019    TDAP status: Due, Education has been provided regarding the importance of this vaccine. Advised may receive this vaccine at local pharmacy or Health Dept. Aware to provide a copy of the vaccination record if obtained from local pharmacy or Health Dept. Verbalized acceptance and understanding.  Flu Vaccine status: Up to date  Pneumococcal vaccine status: Up to date  Covid-19 vaccine status: Completed vaccines  Qualifies for Shingles Vaccine? Yes   Zostavax completed Yes   Shingrix Completed?: Yes  Screening Tests Health Maintenance  Topic Date Due   DTaP/Tdap/Td (1 - Tdap) Never done   COVID-19 Vaccine (3 - Moderna risk series) 09/12/2021   Medicare Annual Wellness (AWV)  12/08/2023   Pneumonia Vaccine 24+ Years old  Completed   INFLUENZA VACCINE  Completed   DEXA SCAN  Completed   Zoster Vaccines- Shingrix  Completed   HPV VACCINES  Aged Out    Health Maintenance  Health Maintenance Due  Topic Date Due   DTaP/Tdap/Td (1 - Tdap) Never done   COVID-19 Vaccine (3 - Moderna risk series) 09/12/2021    Colorectal cancer screening: No longer required.   Mammogram status: No longer required due to age.  Bone Density status: Completed 11/03/2021. Results reflect: Bone density results: OSTEOPENIA. Repeat every 5 years.  Lung Cancer Screening: (Low Dose CT Chest recommended if Age 28-80 years, 30 pack-year currently smoking OR have quit w/in 15years.) does not qualify.   Lung Cancer Screening Referral: n/a  Additional Screening:  Hepatitis C Screening: does not qualify;   Vision Screening: Recommended annual ophthalmology exams for early detection of glaucoma and other disorders of the eye. Is the patient up to date with their annual eye exam?  Yes  Who is the provider or what is the name of the office in which the patient attends  annual eye exams? Dr.Shipiro  If pt is not established with a provider, would they like to be referred to a provider to establish care? No .   Dental Screening: Recommended annual dental exams for proper oral hygiene  Community Resource Referral / Chronic Care Management: CRR required this visit?  No   CCM required this visit?  No      Plan:     I have personally reviewed and noted the following in the patient's chart:   Medical and social history Use of alcohol, tobacco or illicit drugs  Current medications and supplements including opioid prescriptions. Patient is not currently taking opioid prescriptions. Functional ability and status Nutritional status Physical activity Advanced directives List of other physicians Hospitalizations, surgeries, and ER visits in previous 12 months Vitals Screenings to include cognitive, depression, and falls Referrals and appointments  In addition, I have reviewed and discussed with patient certain preventive protocols, quality metrics, and best practice recommendations. A written personalized care plan for preventive services as well as general preventive health recommendations were provided to patient.     Daphane Shepherd, LPN   [id number]  Nurse Notes: Due TDAP Vaccine

## 2022-12-19 ENCOUNTER — Encounter: Payer: Self-pay | Admitting: Family Medicine

## 2022-12-19 ENCOUNTER — Ambulatory Visit: Payer: Medicare PPO | Admitting: Family Medicine

## 2022-12-19 VITALS — BP 151/65 | HR 69 | Temp 97.6°F | Ht 61.0 in | Wt 166.4 lb

## 2022-12-19 DIAGNOSIS — J329 Chronic sinusitis, unspecified: Secondary | ICD-10-CM

## 2022-12-19 DIAGNOSIS — J40 Bronchitis, not specified as acute or chronic: Secondary | ICD-10-CM | POA: Diagnosis not present

## 2022-12-19 DIAGNOSIS — J069 Acute upper respiratory infection, unspecified: Secondary | ICD-10-CM

## 2022-12-19 MED ORDER — PSEUDOEPHEDRINE-GUAIFENESIN ER 120-1200 MG PO TB12: 1.0000 | ORAL_TABLET | Freq: Two times a day (BID) | ORAL | 0 refills | Status: DC

## 2022-12-19 MED ORDER — SULFAMETHOXAZOLE-TRIMETHOPRIM 800-160 MG PO TABS: 1.0000 | ORAL_TABLET | Freq: Two times a day (BID) | ORAL | 0 refills | Status: DC

## 2022-12-19 NOTE — Progress Notes (Signed)
Chief Complaint  Patient presents with   Cough   Nasal Congestion   Chills    HPI  Patient presents today for Symptoms include congestion, facial pain, nasal congestion, productive cough, post nasal drip and sinus pressure. There is no fever, chills, or sweats. Onset of symptoms was a few days ago, gradually worsening since that time. sLEEPS SITTING UP DUE TO DRAINAGE No fever. Purulent sputum. Saline rinse caused a nose bleed 3 days ago. Denies dyspnea.    PMH: Smoking status noted ROS: Per HPI  Objective: BP (!) 151/65   Pulse 69   Temp 97.6 F (36.4 C)   Ht '5\' 1"'$  (1.549 m)   Wt 166 lb 6.4 oz (75.5 kg)   SpO2 100%   BMI 31.44 kg/m  Gen: NAD, alert, cooperative with exam HEENT: NCAT, EOMI, PERRL CV: RRR, good S1/S2, no murmur Resp: CTABL, no wheezes, non-labored Abd: SNTND, BS present, no guarding or organomegaly Ext: No edema, warm Neuro: Alert and oriented, No gross deficits  Assessment and plan:  1. Upper respiratory infection with cough and congestion   2. Sinobronchitis     Meds ordered this encounter  Medications   Pseudoephedrine-Guaifenesin [phone] MG TB12    Sig: Take 1 tablet by mouth 2 (two) times daily. For congestion    Dispense:  20 tablet    Refill:  0   sulfamethoxazole-trimethoprim (BACTRIM DS) 800-160 MG tablet    Sig: Take 1 tablet by mouth 2 (two) times daily. Until gone, for infection    Dispense:  20 tablet    Refill:  0    Orders Placed This Encounter  Procedures   COVID-19, Flu A+B and RSV    Order Specific Question:   Previously tested for COVID-19    Answer:   Unknown    Order Specific Question:   Resident in a congregate (group) care setting    Answer:   Unknown    Order Specific Question:   Is the patient student?    Answer:   No    Order Specific Question:   Employed in healthcare setting    Answer:   No    Order Specific Question:   Pregnant    Answer:   No    Order Specific Question:   Has patient completed COVID  vaccination(s) (2 doses of Pfizer/Moderna 1 dose of [employer])    Answer:   Unknown    Follow up as needed.  Claretta Fraise, MD

## 2022-12-20 LAB — COVID-19, FLU A+B AND RSV
Influenza A, NAA: NOT DETECTED
Influenza B, NAA: NOT DETECTED
RSV, NAA: NOT DETECTED
SARS-CoV-2, NAA: NOT DETECTED

## 2023-01-25 ENCOUNTER — Other Ambulatory Visit: Payer: Self-pay | Admitting: Family Medicine

## 2023-02-14 DIAGNOSIS — J338 Other polyp of sinus: Secondary | ICD-10-CM | POA: Diagnosis not present

## 2023-02-14 DIAGNOSIS — J320 Chronic maxillary sinusitis: Secondary | ICD-10-CM | POA: Diagnosis not present

## 2023-02-18 ENCOUNTER — Ambulatory Visit: Payer: Medicare PPO | Admitting: Family Medicine

## 2023-02-18 ENCOUNTER — Encounter: Payer: Self-pay | Admitting: Family Medicine

## 2023-02-18 VITALS — BP 129/52 | HR 75 | Temp 97.6°F | Ht 61.0 in | Wt 163.0 lb

## 2023-02-18 DIAGNOSIS — E7800 Pure hypercholesterolemia, unspecified: Secondary | ICD-10-CM

## 2023-02-18 DIAGNOSIS — I10 Essential (primary) hypertension: Secondary | ICD-10-CM | POA: Diagnosis not present

## 2023-02-18 MED ORDER — TRIAMTERENE-HCTZ 37.5-25 MG PO TABS: 0.5000 | ORAL_TABLET | Freq: Every day | ORAL | 3 refills | Status: DC

## 2023-02-18 NOTE — Progress Notes (Signed)
Subjective:  Patient ID: Misty Wilkerson, female    DOB: 10/04/1941  Age: 82 y.o. MRN: [id number]  CC: Medical Management of Chronic Issues   HPI Navah Pelts presents for  follow-up of hypertension. Patient has no history of headache chest pain or shortness of breath or recent cough. Patient also denies symptoms of TIA such as focal numbness or weakness. Patient denies side effects from medication. States taking it regularly.   History Armie has a past medical history of Arthritis, Cancer ([place]), H/O hiatal hernia, Hypertension, Personal history of radiation therapy (2002), and Vertigo.   She has a past surgical history that includes Cholecystectomy; Eye surgery; Partial mastectomy with needle localization and axillary sentinel lymph node bx (Left, 08/12/2013); Re-excision of breast lumpectomy (Left, 08/27/2013); Colonoscopy (N/A, 10/26/2015); Excision mass upper extremeties (Left, 03/25/2018); Debridement and closure wound (Left, 03/25/2018); Breast lumpectomy (Left, 2002, 2014); and Maxillary antrostomy (Left, 12/25/2021).   Her family history includes Aneurysm in her sister; Breast cancer in her sister; Breast cancer (age of onset: 8) in her sister; Cancer in her brother; Diabetes in her father; Hypertension in her mother; Stroke in her father.She reports that she quit smoking about 23 years ago. Her smoking use included cigarettes. She has a 7.50 pack-year smoking history. She has never used smokeless tobacco. She reports current alcohol use. She reports that she does not use drugs.  Current Outpatient Medications on File Prior to Visit  Medication Sig Dispense Refill   cholecalciferol (VITAMIN D3) 25 MCG (1000 UNIT) tablet Take 2 tablets (2,000 Units total) by mouth daily.     valsartan (DIOVAN) 160 MG tablet TAKE 1 TABLET BY MOUTH DAILY FOR BLOOD PRESSURE 90 tablet 0   vitamin B-12 (CYANOCOBALAMIN) 1000 MCG tablet Take 1,000 mcg by mouth daily.     No current  facility-administered medications on file prior to visit.    ROS Review of Systems  Constitutional: Negative.   HENT: Negative.    Eyes:  Negative for visual disturbance.  Respiratory:  Negative for shortness of breath.   Cardiovascular:  Negative for chest pain.  Gastrointestinal:  Negative for abdominal pain.  Musculoskeletal:  Negative for arthralgias.    Objective:  BP (!) 129/52   Pulse 75   Temp 97.6 F (36.4 C)   Ht 5\' 1"  (1.549 m)   Wt 163 lb (73.9 kg)   SpO2 100%   BMI 30.80 kg/m   BP Readings from Last 3 Encounters:  02/18/23 (!) 129/52  12/19/22 (!) 151/65  10/15/22 (!) 131/56    Wt Readings from Last 3 Encounters:  02/18/23 163 lb (73.9 kg)  12/19/22 166 lb 6.4 oz (75.5 kg)  12/07/22 159 lb (72.1 kg)     Physical Exam Constitutional:      General: She is not in acute distress.    Appearance: She is well-developed.  Cardiovascular:     Rate and Rhythm: Normal rate and regular rhythm.  Pulmonary:     Breath sounds: Normal breath sounds.  Musculoskeletal:        General: Normal range of motion.  Skin:    General: Skin is warm and dry.  Neurological:     Mental Status: She is alert and oriented to person, place, and time.       Assessment & Plan:   Girtie was seen today for medical management of chronic issues.  Diagnoses and all orders for this visit:  Benign essential hypertension -     CBC with Differential/Platelet -  CMP14+EGFR -     Magnesium  Pure hypercholesterolemia  Other orders -     triamterene-hydrochlorothiazide (MAXZIDE-25) 37.5-25 MG tablet; Take 0.5 tablets by mouth daily.   Allergies as of 02/18/2023       Reactions   Penicillins Shortness Of Breath, Rash   Tetracyclines & Related Itching        Medication List        Accurate as of February 18, 2023  2:02 PM. If you have any questions, ask your nurse or doctor.          STOP taking these medications    benzonatate 200 MG capsule Commonly known  as: TESSALON Stopped by: Claretta Fraise, MD   Pseudoephedrine-Guaifenesin [phone] MG Tb12 Stopped by: Claretta Fraise, MD   sulfamethoxazole-trimethoprim 800-160 MG tablet Commonly known as: BACTRIM DS Stopped by: Claretta Fraise, MD       TAKE these medications    cholecalciferol 25 MCG (1000 UNIT) tablet Commonly known as: VITAMIN D3 Take 2 tablets (2,000 Units total) by mouth daily.   cyanocobalamin 1000 MCG tablet Commonly known as: VITAMIN B12 Take 1,000 mcg by mouth daily.   triamterene-hydrochlorothiazide 37.5-25 MG tablet Commonly known as: MAXZIDE-25 Take 0.5 tablets by mouth daily.   valsartan 160 MG tablet Commonly known as: DIOVAN TAKE 1 TABLET BY MOUTH DAILY FOR BLOOD PRESSURE        Meds ordered this encounter  Medications   triamterene-hydrochlorothiazide (MAXZIDE-25) 37.5-25 MG tablet    Sig: Take 0.5 tablets by mouth daily.    Dispense:  45 tablet    Refill:  3      Follow-up: Return in about 6 months (around 08/21/2023).  Claretta Fraise, M.D.

## 2023-02-19 LAB — CBC WITH DIFFERENTIAL/PLATELET
Basophils Absolute: 0 10*3/uL (ref 0.0–0.2)
Basos: 0 %
EOS (ABSOLUTE): 0.3 10*3/uL (ref 0.0–0.4)
Eos: 5 %
Hematocrit: 36.8 % (ref 34.0–46.6)
Hemoglobin: 12.6 g/dL (ref 11.1–15.9)
Immature Grans (Abs): 0 10*3/uL (ref 0.0–0.1)
Immature Granulocytes: 0 %
Lymphocytes Absolute: 1.3 10*3/uL (ref 0.7–3.1)
Lymphs: 21 %
MCH: 32.5 pg (ref 26.6–33.0)
MCHC: 34.2 g/dL (ref 31.5–35.7)
MCV: 95 fL (ref 79–97)
Monocytes Absolute: 0.5 10*3/uL (ref 0.1–0.9)
Monocytes: 8 %
Neutrophils Absolute: 4 10*3/uL (ref 1.4–7.0)
Neutrophils: 66 %
Platelets: 242 10*3/uL (ref 150–450)
RBC: 3.88 x10E6/uL (ref 3.77–5.28)
RDW: 11.8 % (ref 11.7–15.4)
WBC: 6.1 10*3/uL (ref 3.4–10.8)

## 2023-02-19 LAB — CMP14+EGFR
ALT: 19 IU/L (ref 0–32)
AST: 25 IU/L (ref 0–40)
Albumin/Globulin Ratio: 1.9 (ref 1.2–2.2)
Albumin: 4.5 g/dL (ref 3.8–4.8)
Alkaline Phosphatase: 68 IU/L (ref 44–121)
BUN/Creatinine Ratio: 17 (ref 12–28)
BUN: 23 mg/dL (ref 8–27)
Bilirubin Total: 0.2 mg/dL (ref 0.0–1.2)
CO2: 20 mmol/L (ref 20–29)
Calcium: 9.6 mg/dL (ref 8.7–10.3)
Chloride: 103 mmol/L (ref 96–106)
Creatinine, Ser: 1.33 mg/dL — ABNORMAL HIGH (ref 0.57–1.00)
Globulin, Total: 2.4 g/dL (ref 1.5–4.5)
Glucose: 90 mg/dL (ref 70–99)
Potassium: 5.5 mmol/L — ABNORMAL HIGH (ref 3.5–5.2)
Sodium: 138 mmol/L (ref 134–144)
Total Protein: 6.9 g/dL (ref 6.0–8.5)
eGFR: 40 mL/min/{1.73_m2} — ABNORMAL LOW (ref >59)

## 2023-02-19 LAB — MAGNESIUM: Magnesium: 2 mg/dL (ref 1.6–2.3)

## 2023-02-19 NOTE — Progress Notes (Signed)
Hello  Khamryn,    Your lab result is normal and/or stable.Some minor variations that are not significant are commonly marked abnormal, but do not represent any medical problem for you.   Best regards,  Naithan Delage, M.D.

## 2023-04-27 ENCOUNTER — Other Ambulatory Visit: Payer: Self-pay | Admitting: Family Medicine

## 2023-07-05 ENCOUNTER — Encounter: Payer: Self-pay | Admitting: *Deleted

## 2023-07-26 ENCOUNTER — Other Ambulatory Visit: Payer: Self-pay | Admitting: Family Medicine

## 2023-07-26 DIAGNOSIS — Z1231 Encounter for screening mammogram for malignant neoplasm of breast: Secondary | ICD-10-CM

## 2023-08-06 ENCOUNTER — Encounter: Payer: Self-pay | Admitting: Family Medicine

## 2023-08-06 ENCOUNTER — Ambulatory Visit: Payer: Medicare PPO | Admitting: Family Medicine

## 2023-08-06 VITALS — BP 163/69 | HR 67 | Temp 97.6°F | Ht 61.0 in | Wt 161.2 lb

## 2023-08-06 DIAGNOSIS — M810 Age-related osteoporosis without current pathological fracture: Secondary | ICD-10-CM | POA: Diagnosis not present

## 2023-08-06 DIAGNOSIS — E7800 Pure hypercholesterolemia, unspecified: Secondary | ICD-10-CM

## 2023-08-06 DIAGNOSIS — I10 Essential (primary) hypertension: Secondary | ICD-10-CM | POA: Diagnosis not present

## 2023-08-06 NOTE — Progress Notes (Signed)
Subjective:  Patient ID: Misty Wilkerson, female    DOB: 1941-07-05  Age: 82 y.o. MRN: [id number]  CC: Medical Management of Chronic Issues   HPI Misty Wilkerson presents for  follow-up of hypertension. Patient has no history of headache chest pain or shortness of breath or recent cough. Patient also denies symptoms of TIA such as focal numbness or weakness. Patient denies side effects from medication. States taking it regularly.BP at home runs 110-20/60s  Also has low vitamin D & B12 taking OTC supplements.  Osteopenia - not on meds due to alendronate causing diarrhea. Due for DEXA late this year.   History Misty Wilkerson has a past medical history of Arthritis, Cancer ([facility]), H/O hiatal hernia, Hypertension, Personal history of radiation therapy (2002), and Vertigo.   She has a past surgical history that includes Cholecystectomy; Eye surgery; Partial mastectomy with needle localization and axillary sentinel lymph node bx (Left, 08/12/2013); Re-excision of breast lumpectomy (Left, 08/27/2013); Colonoscopy (N/A, 10/26/2015); Excision mass upper extremeties (Left, 03/25/2018); Debridement and closure wound (Left, 03/25/2018); Breast lumpectomy (Left, 2002, 2014); and Maxillary antrostomy (Left, 12/25/2021).   Her family history includes Aneurysm in her sister; Breast cancer in her sister; Breast cancer (age of onset: 34) in her sister; Cancer in her brother; Diabetes in her father; Hypertension in her mother; Stroke in her father.She reports that she quit smoking about 24 years ago. Her smoking use included cigarettes. She started smoking about 39 years ago. She has a 7.5 pack-year smoking history. She has never used smokeless tobacco. She reports current alcohol use. She reports that she does not use drugs.  Current Outpatient Medications on File Prior to Visit  Medication Sig Dispense Refill   cholecalciferol (VITAMIN D3) 25 MCG (1000 UNIT) tablet Take 2 tablets (2,000 Units total) by mouth daily.      triamterene-hydrochlorothiazide (MAXZIDE-25) 37.5-25 MG tablet Take 0.5 tablets by mouth daily. 45 tablet 3   valsartan (DIOVAN) 160 MG tablet TAKE 1 TABLET BY MOUTH DAILY FOR BLOOD PRESSURE 90 tablet 1   vitamin B-12 (CYANOCOBALAMIN) 1000 MCG tablet Take 1,000 mcg by mouth daily.     No current facility-administered medications on file prior to visit.    ROS Review of Systems  Constitutional: Negative.   HENT: Negative.    Eyes:  Negative for visual disturbance.  Respiratory:  Negative for shortness of breath.   Cardiovascular:  Negative for chest pain.  Gastrointestinal:  Negative for abdominal pain.  Endocrine: Positive for heat intolerance.  Musculoskeletal:  Negative for arthralgias.    Objective:  BP (!) 163/69   Pulse 67   Temp 97.6 F (36.4 C)   Ht 5\' 1"  (1.549 m)   Wt 161 lb 3.2 oz (73.1 kg)   SpO2 98%   BMI 30.46 kg/m   BP Readings from Last 3 Encounters:  08/06/23 (!) 163/69  02/18/23 (!) 129/52  12/19/22 (!) 151/65    Wt Readings from Last 3 Encounters:  08/06/23 161 lb 3.2 oz (73.1 kg)  02/18/23 163 lb (73.9 kg)  12/19/22 166 lb 6.4 oz (75.5 kg)     Physical Exam Constitutional:      General: She is not in acute distress.    Appearance: She is well-developed.  Cardiovascular:     Rate and Rhythm: Normal rate and regular rhythm.  Pulmonary:     Breath sounds: Normal breath sounds.  Musculoskeletal:        General: Normal range of motion.  Skin:    General: Skin is warm  and dry.  Neurological:     Mental Status: She is alert and oriented to person, place, and time.       Assessment & Plan:   Misty Wilkerson was seen today for medical management of chronic issues.  Diagnoses and all orders for this visit:  Benign essential hypertension -     CBC with Differential/Platelet -     CMP14+EGFR  Pure hypercholesterolemia -     Lipid panel  Age-related osteoporosis without current pathological fracture   Allergies as of 08/06/2023        Reactions   Penicillins Shortness Of Breath, Rash   Tetracyclines & Related Itching        Medication List        Accurate as of August 06, 2023  1:44 PM. If you have any questions, ask your nurse or doctor.          cholecalciferol 25 MCG (1000 UNIT) tablet Commonly known as: VITAMIN D3 Take 2 tablets (2,000 Units total) by mouth daily.   cyanocobalamin 1000 MCG tablet Commonly known as: VITAMIN B12 Take 1,000 mcg by mouth daily.   triamterene-hydrochlorothiazide 37.5-25 MG tablet Commonly known as: MAXZIDE-25 Take 0.5 tablets by mouth daily.   valsartan 160 MG tablet Commonly known as: DIOVAN TAKE 1 TABLET BY MOUTH DAILY FOR BLOOD PRESSURE        No orders of the defined types were placed in this encounter.   Consider prolia for bone health. DEXA in DEcember  Follow-up: Return in about 6 months (around 02/03/2024).  [name], M.D.

## 2023-08-07 LAB — LIPID PANEL
Chol/HDL Ratio: 3.4 ratio (ref 0.0–4.4)
Cholesterol, Total: 217 mg/dL — ABNORMAL HIGH (ref 100–199)
HDL: 64 mg/dL (ref >39)
LDL Chol Calc (NIH): 135 mg/dL — ABNORMAL HIGH (ref 0–99)
Triglycerides: 102 mg/dL (ref 0–149)
VLDL Cholesterol Cal: 18 mg/dL (ref 5–40)

## 2023-08-07 LAB — CMP14+EGFR
ALT: 20 IU/L (ref 0–32)
AST: 23 IU/L (ref 0–40)
Albumin: 4.6 g/dL (ref 3.8–4.8)
Alkaline Phosphatase: 70 IU/L (ref 44–121)
BUN/Creatinine Ratio: 15 (ref 12–28)
BUN: 17 mg/dL (ref 8–27)
Bilirubin Total: 0.5 mg/dL (ref 0.0–1.2)
CO2: 22 mmol/L (ref 20–29)
Calcium: 9.9 mg/dL (ref 8.7–10.3)
Chloride: 101 mmol/L (ref 96–106)
Creatinine, Ser: 1.17 mg/dL — ABNORMAL HIGH (ref 0.57–1.00)
Globulin, Total: 2.6 g/dL (ref 1.5–4.5)
Glucose: 94 mg/dL (ref 70–99)
Potassium: 5 mmol/L (ref 3.5–5.2)
Sodium: 138 mmol/L (ref 134–144)
Total Protein: 7.2 g/dL (ref 6.0–8.5)
eGFR: 47 mL/min/{1.73_m2} — ABNORMAL LOW (ref >59)

## 2023-08-07 LAB — CBC WITH DIFFERENTIAL/PLATELET
Basophils Absolute: 0 10*3/uL (ref 0.0–0.2)
Basos: 0 %
EOS (ABSOLUTE): 0.3 10*3/uL (ref 0.0–0.4)
Eos: 5 %
Hematocrit: 41.8 % (ref 34.0–46.6)
Hemoglobin: 13.6 g/dL (ref 11.1–15.9)
Immature Grans (Abs): 0 10*3/uL (ref 0.0–0.1)
Immature Granulocytes: 0 %
Lymphocytes Absolute: 1.1 10*3/uL (ref 0.7–3.1)
Lymphs: 19 %
MCH: 31.5 pg (ref 26.6–33.0)
MCHC: 32.5 g/dL (ref 31.5–35.7)
MCV: 97 fL (ref 79–97)
Monocytes Absolute: 0.4 10*3/uL (ref 0.1–0.9)
Monocytes: 7 %
Neutrophils Absolute: 4.1 10*3/uL (ref 1.4–7.0)
Neutrophils: 69 %
Platelets: 239 10*3/uL (ref 150–450)
RBC: 4.32 x10E6/uL (ref 3.77–5.28)
RDW: 11.5 % — ABNORMAL LOW (ref 11.7–15.4)
WBC: 6 10*3/uL (ref 3.4–10.8)

## 2023-08-08 ENCOUNTER — Ambulatory Visit
Admission: RE | Admit: 2023-08-08 | Discharge: 2023-08-08 | Disposition: A | Discharge status: Home / Self Care | Payer: Medicare PPO | Source: Ambulatory Visit | Attending: Family Medicine | Admitting: Family Medicine

## 2023-08-08 DIAGNOSIS — Z1231 Encounter for screening mammogram for malignant neoplasm of breast: Secondary | ICD-10-CM | POA: Diagnosis not present

## 2023-08-13 ENCOUNTER — Other Ambulatory Visit: Payer: Self-pay | Admitting: Family Medicine

## 2023-08-13 DIAGNOSIS — R928 Other abnormal and inconclusive findings on diagnostic imaging of breast: Secondary | ICD-10-CM

## 2023-08-15 ENCOUNTER — Ambulatory Visit: Payer: Medicare PPO | Admitting: Family Medicine

## 2023-08-15 DIAGNOSIS — J320 Chronic maxillary sinusitis: Secondary | ICD-10-CM | POA: Diagnosis not present

## 2023-08-15 DIAGNOSIS — J338 Other polyp of sinus: Secondary | ICD-10-CM | POA: Diagnosis not present

## 2023-08-19 ENCOUNTER — Ambulatory Visit
Admission: RE | Admit: 2023-08-19 | Discharge: 2023-08-19 | Disposition: A | Discharge status: Home / Self Care | Payer: Medicare PPO | Source: Ambulatory Visit | Attending: Family Medicine | Admitting: Family Medicine

## 2023-08-19 ENCOUNTER — Other Ambulatory Visit: Payer: Self-pay | Admitting: Family Medicine

## 2023-08-19 DIAGNOSIS — Z853 Personal history of malignant neoplasm of breast: Secondary | ICD-10-CM | POA: Diagnosis not present

## 2023-08-19 DIAGNOSIS — R928 Other abnormal and inconclusive findings on diagnostic imaging of breast: Secondary | ICD-10-CM

## 2023-08-19 DIAGNOSIS — N6325 Unspecified lump in the left breast, overlapping quadrants: Secondary | ICD-10-CM | POA: Diagnosis not present

## 2023-08-19 DIAGNOSIS — N6320 Unspecified lump in the left breast, unspecified quadrant: Secondary | ICD-10-CM

## 2023-08-21 ENCOUNTER — Ambulatory Visit: Payer: Medicare PPO | Admitting: Family Medicine

## 2023-08-26 ENCOUNTER — Other Ambulatory Visit: Payer: Medicare PPO

## 2023-08-29 ENCOUNTER — Ambulatory Visit
Admission: RE | Admit: 2023-08-29 | Discharge: 2023-08-29 | Disposition: A | Discharge status: Home / Self Care | Payer: Medicare PPO | Source: Ambulatory Visit | Attending: Family Medicine | Admitting: Family Medicine

## 2023-08-29 ENCOUNTER — Other Ambulatory Visit: Payer: Self-pay | Admitting: Diagnostic Radiology

## 2023-08-29 DIAGNOSIS — N6321 Unspecified lump in the left breast, upper outer quadrant: Secondary | ICD-10-CM | POA: Diagnosis not present

## 2023-08-29 DIAGNOSIS — N6320 Unspecified lump in the left breast, unspecified quadrant: Secondary | ICD-10-CM | POA: Diagnosis not present

## 2023-08-29 DIAGNOSIS — C50812 Malignant neoplasm of overlapping sites of left female breast: Secondary | ICD-10-CM | POA: Diagnosis not present

## 2023-08-29 HISTORY — PX: BREAST BIOPSY: SHX20

## 2023-08-30 LAB — SURGICAL PATHOLOGY

## 2023-09-06 ENCOUNTER — Telehealth: Payer: Self-pay | Admitting: Hematology and Oncology

## 2023-09-06 DIAGNOSIS — Z170 Estrogen receptor positive status [ER+]: Secondary | ICD-10-CM | POA: Diagnosis not present

## 2023-09-06 DIAGNOSIS — C50412 Malignant neoplasm of upper-outer quadrant of left female breast: Secondary | ICD-10-CM | POA: Diagnosis not present

## 2023-09-06 NOTE — Telephone Encounter (Signed)
Per staff message sent on 10/11 patient is aware of scheduled appointment times/dates for follow up appointment

## 2023-09-10 ENCOUNTER — Inpatient Hospital Stay: Payer: Medicare PPO | Attending: Hematology and Oncology | Admitting: Hematology and Oncology

## 2023-09-10 VITALS — BP 147/52 | HR 97 | Temp 97.5°F | Resp 16 | Wt 161.1 lb

## 2023-09-10 DIAGNOSIS — Z1731 Human epidermal growth factor receptor 2 positive status: Secondary | ICD-10-CM | POA: Diagnosis not present

## 2023-09-10 DIAGNOSIS — Z170 Estrogen receptor positive status [ER+]: Secondary | ICD-10-CM | POA: Diagnosis not present

## 2023-09-10 DIAGNOSIS — C50412 Malignant neoplasm of upper-outer quadrant of left female breast: Secondary | ICD-10-CM | POA: Insufficient documentation

## 2023-09-10 NOTE — Progress Notes (Signed)
[facility] CONSULT NOTE  Patient Care Team: [name], MD as PCP - General (Family Medicine)  CHIEF COMPLAINTS/PURPOSE OF CONSULTATION:  Newly diagnosed breast cancer  HISTORY OF PRESENTING ILLNESS:  Misty Wilkerson 82 y.o. female is here because of recent diagnosis of left breast cancer  I reviewed her records extensively and collaborated the history with the patient.  SUMMARY OF ONCOLOGIC HISTORY: Oncology History  Breast cancer of upper-inner quadrant of left female breast (HCC)  12/08/1998 Initial Biopsy   Left breast cancer: Lumpectomy followed by radiation T1 BN 0M0 could not tolerate tamoxifen or alternative therapies.   08/12/2013 Surgery   Left breast lumpectomy: Tumor 1  invasive ductal carcinoma 0.4 cm with DCIS, tumor to: IDC 1.2 cm with DCIS positive margin, ER 98%, PR 14%, HER-2 negative, Ki-67 19% margins reexcision negative   09/07/2013 -  Anti-estrogen oral therapy   Letrozole 2.5 mg daily plans for 5 years   Breast cancer of upper-outer quadrant of left female breast (HCC)  09/10/2023 Initial Diagnosis   Breast cancer of upper-outer quadrant of left female breast (HCC)   09/10/2023 Cancer Staging   Staging form: Breast, AJCC 8th Edition - Clinical: Stage IA (cT1, cN0, cM0, G3, ER+, PR+, HER2+) - Signed by Rachel Moulds, MD on 09/10/2023 Stage prefix: Initial diagnosis Histologic grading system: 3 grade system    The patient, an 81 year old woman with a history of breast cancer, presents with a new breast mass. This is her third occurrence of breast cancer, with previous diagnoses in 2000 and 2014. The patient reports that the current mass is a different type of cancer, identified as HER2 positive. The patient expresses a strong preference for a lumpectomy and radiation treatment, rather than a mastectomy, due to concerns about quality of life and recovery time. The patient's spouse is supportive of this decision, emphasizing the importance  of maintaining the patient's quality of life. The patient and spouse also express a desire to proceed with a planned vacation in the near future, indicating a willingness to delay treatment if deemed safe by the medical team.  MEDICAL HISTORY:  Past Medical History:  Diagnosis Date   Arthritis    Cancer (HCC)    left breast   H/O hiatal hernia    eats slowly  now   Hypertension    Personal history of radiation therapy 2002   left breast   Vertigo     SURGICAL HISTORY: Past Surgical History:  Procedure Laterality Date   BREAST BIOPSY Left 08/29/2023   [place] LT BREAST BX W LOC DEV 1ST LESION IMG BX SPEC US GUIDE 08/29/2023 [facility]   BREAST LUMPECTOMY Left 2002, 2014   CHOLECYSTECTOMY     COLONOSCOPY N/A 10/26/2015   Procedure: COLONOSCOPY;  Surgeon: Malissa Hippo, MD;  Location: [facility];  Service: Endoscopy;  Laterality: N/A;  1200   DEBRIDEMENT AND CLOSURE WOUND Left 03/25/2018   Procedure: DEBRIDEMENTINTERPHALANGEAL LEFT INDEX FINGER;  Surgeon: Cindee Salt, MD;  Location: [facility];  Service: Orthopedics;  Laterality: Left;   EXCISION MASS UPPER EXTREMETIES Left 03/25/2018   Procedure: EXCISION MASS LEFT INDEX;  Surgeon: Cindee Salt, MD;  Location: [facility];  Service: Orthopedics;  Laterality: Left;   EYE SURGERY     bilateral cataracts   MAXILLARY ANTROSTOMY Left 12/25/2021   Procedure: LEFT MAXILLARY ANTROSTOMY WITH TISSUE REMOVAL;  Surgeon: Newman Pies, MD;  Location: [facility];  Service: ENT;  Laterality: Left;   PARTIAL  MASTECTOMY WITH NEEDLE LOCALIZATION AND AXILLARY SENTINEL LYMPH NODE BX Left 08/12/2013   Procedure: LEFT PARTIAL MASTECTOMY WITH NEEDLE LOCALIZATION ATTEMPTED AXILLARY SENTINEL LYMPH NODE BIOPSY;  Surgeon: [name], MD;  Location: [facility];  Service: General;  Laterality: Left;   RE-EXCISION OF BREAST LUMPECTOMY Left 08/27/2013   Procedure: RE-EXCISION OF BREAST LUMPECTOMY;  Surgeon: [name], MD;  Location: [facility];  Service: General;  Laterality: Left;    SOCIAL HISTORY: Social History   Socioeconomic History   Marital status: Married    Spouse name: Kathlene November   Number of children: 2   Years of education: Not on file   Highest education level: Bachelor's degree (e.g., BA, AB, BS)  Occupational History   Occupation: retired  Tobacco Use   Smoking status: Former    Current packs/day: 0.00    Average packs/day: 0.5 packs/day for 15.0 years (7.5 ttl pk-yrs)    Types: Cigarettes    Start date: 07/21/1984    Quit date: 07/22/1999    Years since quitting: 24.1   Smokeless tobacco: Never  Vaping Use   Vaping status: Never Used  Substance and Sexual Activity   Alcohol use: Yes    Comment: one alcohol drink daily   Drug use: No   Sexual activity: Yes  Other Topics Concern   Not on file  Social History Narrative   Very independent and pretty active   Social Determinants of Health   Financial Resource Strain: Low Risk  (02/15/2023)   Overall Financial Resource Strain (CARDIA)    Difficulty of Paying Living Expenses: Not hard at all  Food Insecurity: No Food Insecurity (02/15/2023)   Hunger Vital Sign    Worried About Running Out of Food in the Last Year: Never true    Ran Out of Food in the Last Year: Never true  Transportation Needs: No Transportation Needs (02/15/2023)   PRAPARE - Administrator, Civil Service (Medical): No    Lack of Transportation (Non-Medical): No  Physical Activity: Sufficiently Active (02/15/2023)   Exercise Vital Sign    Days of Exercise per Week: 5 days    Minutes of Exercise per Session: 30 min  Recent Concern: Physical Activity - Insufficiently Active (12/07/2022)   Exercise Vital Sign    Days of Exercise per Week: 3 days    Minutes of Exercise per Session: 30 min  Stress: No Stress Concern Present (02/15/2023)   [employer] of Occupational Health - Occupational Stress Questionnaire    Feeling of Stress : Only a  little  Social Connections: Socially Integrated (02/15/2023)   Social Connection and Isolation Panel [NHANES]    Frequency of Communication with Friends and Family: More than three times a week    Frequency of Social Gatherings with Friends and Family: Once a week    Attends Religious Services: More than 4 times per year    Active Member of [employer] or Organizations: Yes    Attends Engineer, structural: More than 4 times per year    Marital Status: Married  Catering manager Violence: Not At Risk (12/07/2022)   Humiliation, Afraid, Rape, and Kick questionnaire    Fear of Current or Ex-Partner: No    Emotionally Abused: No    Physically Abused: No    Sexually Abused: No    FAMILY HISTORY: Family History  Problem Relation Age of Onset   Hypertension Mother    Diabetes Father    Stroke Father    Breast cancer Sister 27  Breast cancer Sister    Aneurysm Sister    Cancer Brother     ALLERGIES:  is allergic to penicillins and tetracyclines & related.  MEDICATIONS:  Current Outpatient Medications  Medication Sig Dispense Refill   cholecalciferol (VITAMIN D3) 25 MCG (1000 UNIT) tablet Take 2 tablets (2,000 Units total) by mouth daily.     triamterene-hydrochlorothiazide (MAXZIDE-25) 37.5-25 MG tablet Take 0.5 tablets by mouth daily. 45 tablet 3   valsartan (DIOVAN) 160 MG tablet TAKE 1 TABLET BY MOUTH DAILY FOR BLOOD PRESSURE 90 tablet 1   vitamin B-12 (CYANOCOBALAMIN) 1000 MCG tablet Take 1,000 mcg by mouth daily.     No current facility-administered medications for this visit.     PHYSICAL EXAMINATION: ECOG PERFORMANCE STATUS: 0 - Asymptomatic  Vitals:   09/10/23 1309  BP: (!) 147/52  Pulse: 97  Resp: 16  Temp: (!) 97.5 F (36.4 C)  SpO2: 99%   Filed Weights   09/10/23 1309  Weight: 161 lb 1.6 oz (73.1 kg)    GENERAL:alert, no distress and comfortable Breast: Bilateral breast inspected and palpated.  Left breast with postsurgical changes, she had 2  lumpectomies.  No palpable breast masses on exam today.  No regional adenopathy. LABORATORY DATA:  I have reviewed the data as listed Lab Results  Component Value Date   WBC 6.0 08/06/2023   HGB 13.6 08/06/2023   HCT 41.8 08/06/2023   MCV 97 08/06/2023   PLT 239 08/06/2023   Lab Results  Component Value Date   NA 138 08/06/2023   K 5.0 08/06/2023   CL 101 08/06/2023   CO2 22 08/06/2023    RADIOGRAPHIC STUDIES: I have personally reviewed the radiological reports and agreed with the findings in the report.  ASSESSMENT AND PLAN:  Breast cancer of upper-outer quadrant of left female breast [facility]) This is a 82 year old female patient with past medical history significant for left breast cancer in 2000, 2014 now presents with new onset left breast invasive ductal carcinoma, grade 3, ER/PR and HER2 amplified follows up with Dr. Pamelia Hoit, seen today by me while Dr. Pamelia Hoit is away.  Patient has followed up with Dr. Dwain Sarna and the recommendation was to consider mastectomy given the HER2 amplified breast cancer but patient is strongly opposed to it.  We have today reviewed about consideration for lumpectomy with reirradiation and referral to radiation oncology locally since she is not willing to consider mastectomy especially because she is worried about her quality of life.  In the adjuvant setting, we have discussed standard of care which usually entails Taxol with Herceptin followed by Herceptin maintenance.  She is strongly opposed to the idea of chemotherapy.  She says she is almost 29 and she wants to have a great quality of life.  Hence we have discussed about adjuvant subcu Herceptin for 1 year and she is agreeable to this.  I will send a referral to radiation oncology here to discuss about role of reirradiation.  If she is not a candidate for local radiation, she can go back to [facility] for consideration of adjuvant radiation.  Total time: 40 min  All questions were answered. The patient knows  to call the clinic with any problems, questions or concerns.    Rachel Moulds, MD 09/10/23

## 2023-09-10 NOTE — Assessment & Plan Note (Signed)
This is a 82 year old female patient with past medical history significant for left breast cancer in 2000, 2014 now presents with new onset left breast invasive ductal carcinoma, grade 3, ER/PR and HER2 amplified follows up with Dr. Pamelia Hoit, seen today by me while Dr. Pamelia Hoit is away.  Patient has followed up with Dr. Dwain Sarna and the recommendation was to consider mastectomy given the HER2 amplified breast cancer but patient is strongly opposed to it.  We have today reviewed about consideration for lumpectomy with reirradiation and referral to radiation oncology locally since she is not willing to consider mastectomy especially because she is worried about her quality of life.  In the adjuvant setting, we have discussed standard of care which usually entails Taxol with Herceptin followed by Herceptin maintenance.  She is strongly opposed to the idea of chemotherapy.  She says she is almost 89 and she wants to have a great quality of life.  Hence we have discussed about adjuvant subcu Herceptin for 1 year and she is agreeable to this.  I will send a referral to radiation oncology here to discuss about role of reirradiation.  If she is not a candidate for local radiation, she can go back to [facility] for consideration of adjuvant radiation.

## 2023-09-11 ENCOUNTER — Telehealth: Payer: Self-pay | Admitting: Radiation Oncology

## 2023-09-11 NOTE — Telephone Encounter (Signed)
Called patient to schedule a consultation w. Dr. Mitzi Hansen. Patient stated she is on a 2 wk vacation, requested an appt delay.

## 2023-09-12 ENCOUNTER — Encounter: Payer: Self-pay | Admitting: *Deleted

## 2023-09-12 ENCOUNTER — Other Ambulatory Visit: Payer: Self-pay | Admitting: General Surgery

## 2023-09-12 DIAGNOSIS — C50412 Malignant neoplasm of upper-outer quadrant of left female breast: Secondary | ICD-10-CM

## 2023-09-13 ENCOUNTER — Other Ambulatory Visit: Payer: Self-pay | Admitting: General Surgery

## 2023-09-13 DIAGNOSIS — Z170 Estrogen receptor positive status [ER+]: Secondary | ICD-10-CM

## 2023-09-20 ENCOUNTER — Encounter (HOSPITAL_BASED_OUTPATIENT_CLINIC_OR_DEPARTMENT_OTHER): Payer: Self-pay | Admitting: General Surgery

## 2023-09-23 NOTE — Progress Notes (Signed)
Radiation Oncology         [phone] ________________________________  Name: Misty Wilkerson        MRN: [id number]  Date of Service: 09/24/2023 DOB: 04/28/1941  WU:[id number], Broadus John, MD  Rachel Moulds, MD     REFERRING PHYSICIAN: Rachel Moulds, MD   DIAGNOSIS: {There were no encounter diagnoses. (Refresh or delete this SmartLink)}   HISTORY OF PRESENT ILLNESS: Misty Wilkerson is a 82 y.o. female seen at the request of Dr. Dwain Sarna for a diagnosis of breast cancer.  The patient had a history of left stage I, T1N0 ER positive breast cancer in 2000 which was treated with lumpectomy and radiation though she could not tolerate tamoxifen or alternative therapies.  She was found to have new disease in 2014 in the left breast and underwent a left lumpectomy which showed 2 separate lesions and she underwent bracketed lumpectomy on 08/12/2013 which showed grade 1 and grade 2 invasive ductal carcinomas with associated DCIS.  Tumor #2 had a positive superior margin and less than 1 mm for DCIS for tumor #1 and both estrogen and progesterone were positive, HER2 was negative.  She had reexcision of her margins that showed negative tissue without any remaining cancer.  She was treated with antiestrogen therapy***.   Recently however she was found to have a screening detected asymmetry above the area of the prior lumpectomy in the left breast and by further diagnostic ultrasound on 08/19/2023, the lesion in the 3 o'clock position of the breast on the left side persisted measuring 5 mm and her axilla was negative for adenopathy.  She underwent a biopsy on 08/29/2023 which showed a grade 3 invasive ductal carcinoma that was ER/PR positive, HER2 was positive and her Ki-67 was 30%.  Recommendations were for mastectomy and antiestrogen therapy after her case had been discussed in multidisciplinary breast oncology conference however she is motivated to keep her breast and is seen to consider whether she is a  candidate for reirradiation.  While she is opposed to the idea of chemotherapy she is in agreement to subcutaneous Herceptin.  She is planning to undergo lumpectomy on 09/30/2023.  She is seen today to discuss reirradiation of the left breast.  PREVIOUS RADIATION THERAPY: Yes   2000: Breast was treated at [facility], details about her treatment are not available in the Care Everywhere system.***   PAST MEDICAL HISTORY:  Past Medical History:  Diagnosis Date   Arthritis    Cancer (HCC)    left breast   H/O hiatal hernia    eats slowly  now   Hypertension    Personal history of radiation therapy 2002   left breast   Vertigo        PAST SURGICAL HISTORY: Past Surgical History:  Procedure Laterality Date   BREAST BIOPSY Left 08/29/2023   [place] LT BREAST BX W LOC DEV 1ST LESION IMG BX SPEC US GUIDE 08/29/2023 [facility] M[facility]MO[facility]   BREAST LUMPECTOMY Left 2002, 2014   CHOLECYSTECTOMY     COLONOSCOPY N/A 10/26/2015   Procedure: COLONOSCOPY;  Surgeon: Malissa Hippo, MD;  Location: [facility];  Service: Endoscopy;  Laterality: N/A;  1200   DEBRIDEMENT AND CLOSURE WOUND Left 03/25/2018   Procedure: DEBRIDEMENTINTERPHALANGEAL LEFT INDEX FINGER;  Surgeon: Cindee Salt, MD;  Location: [facility];  Service: Orthopedics;  Laterality: Left;   EXCISION MASS UPPER EXTREMETIES Left 03/25/2018   Procedure: EXCISION MASS LEFT INDEX;  Surgeon: Cindee Salt, MD;  Location: [facility];  Service: Orthopedics;  Laterality: Left;   EYE SURGERY     bilateral cataracts   MAXILLARY ANTROSTOMY Left 12/25/2021   Procedure: LEFT MAXILLARY ANTROSTOMY WITH TISSUE REMOVAL;  Surgeon: Newman Pies, MD;  Location: [facility];  Service: ENT;  Laterality: Left;   PARTIAL MASTECTOMY WITH NEEDLE LOCALIZATION AND AXILLARY SENTINEL LYMPH NODE BX Left 08/12/2013   Procedure: LEFT PARTIAL MASTECTOMY WITH NEEDLE LOCALIZATION ATTEMPTED AXILLARY SENTINEL LYMPH NODE BIOPSY;  Surgeon:  [name], MD;  Location: [facility];  Service: General;  Laterality: Left;   RE-EXCISION OF BREAST LUMPECTOMY Left 08/27/2013   Procedure: RE-EXCISION OF BREAST LUMPECTOMY;  Surgeon: [name], MD;  Location: [facility];  Service: General;  Laterality: Left;     FAMILY HISTORY:  Family History  Problem Relation Age of Onset   Hypertension Mother    Diabetes Father    Stroke Father    Breast cancer Sister 37   Breast cancer Sister    Aneurysm Sister    Cancer Brother      SOCIAL HISTORY:  reports that she quit smoking about 24 years ago. Her smoking use included cigarettes. She started smoking about 39 years ago. She has a 7.5 pack-year smoking history. She has never used smokeless tobacco. She reports current alcohol use. She reports that she does not use drugs.   ALLERGIES: Penicillins and Tetracyclines & related   MEDICATIONS:  Current Outpatient Medications  Medication Sig Dispense Refill   cholecalciferol (VITAMIN D3) 25 MCG (1000 UNIT) tablet Take 2 tablets (2,000 Units total) by mouth daily.     triamterene-hydrochlorothiazide (MAXZIDE-25) 37.5-25 MG tablet Take 0.5 tablets by mouth daily. 45 tablet 3   valsartan (DIOVAN) 160 MG tablet TAKE 1 TABLET BY MOUTH DAILY FOR BLOOD PRESSURE 90 tablet 1   vitamin B-12 (CYANOCOBALAMIN) 1000 MCG tablet Take 1,000 mcg by mouth daily.     No current facility-administered medications for this visit.     REVIEW OF SYSTEMS: On review of systems, the patient reports that she is doing ***     PHYSICAL EXAM:  Wt Readings from Last 3 Encounters:  09/10/23 161 lb 1.6 oz (73.1 kg)  08/06/23 161 lb 3.2 oz (73.1 kg)  02/18/23 163 lb (73.9 kg)   Temp Readings from Last 3 Encounters:  09/10/23 (!) 97.5 F (36.4 C) (Temporal)  08/06/23 97.6 F (36.4 C)  02/18/23 97.6 F (36.4 C)   BP Readings from Last 3 Encounters:  09/10/23 (!) 147/52  08/06/23 (!) 163/69  02/18/23 (!) 129/52   Pulse Readings from Last 3 Encounters:   09/10/23 97  08/06/23 67  02/18/23 75    In general this is a well appearing Caucasian female in no acute distress. She's alert and oriented x4 and appropriate throughout the examination. Cardiopulmonary assessment is negative for acute distress and she exhibits normal effort. Bilateral breast exam is deferred.    ECOG = ***  0 - Asymptomatic (Fully active, able to carry on all predisease activities without restriction)  1 - Symptomatic but completely ambulatory (Restricted in physically strenuous activity but ambulatory and able to carry out work of a light or sedentary nature. For example, light housework, office work)  2 - Symptomatic, <50% in bed during the day (Ambulatory and capable of all self care but unable to carry out any work activities. Up and about more than 50% of waking hours)  3 - Symptomatic, >50% in bed, but not bedbound (Capable of only limited self-care, confined to bed or chair  50% or more of waking hours)  4 - Bedbound (Completely disabled. Cannot carry on any self-care. Totally confined to bed or chair)  5 - Death   Santiago Glad MM, Creech RH, Tormey DC, et al. [phone]). "Toxicity and response criteria of the [facility]". Am. Evlyn Clines. Oncol. 5 (6): 649-55    LABORATORY DATA:  Lab Results  Component Value Date   WBC 6.0 08/06/2023   HGB 13.6 08/06/2023   HCT 41.8 08/06/2023   MCV 97 08/06/2023   PLT 239 08/06/2023   Lab Results  Component Value Date   NA 138 08/06/2023   K 5.0 08/06/2023   CL 101 08/06/2023   CO2 22 08/06/2023   Lab Results  Component Value Date   ALT 20 08/06/2023   AST 23 08/06/2023   ALKPHOS 70 08/06/2023   BILITOT 0.5 08/06/2023      RADIOGRAPHY: [place] LT BREAST BX W LOC DEV 1ST LESION IMG BX SPEC US GUIDE  Addendum Date: 08/30/2023   ADDENDUM REPORT: 08/30/2023 12:17 ADDENDUM: PATHOLOGY revealed: 1. Breast, left, needle core biopsy, 3:00 mass, ribbon clip : INVASIVE DUCTAL CARCINOMA. OVERALL GRADE: 3.  LYMPHOVASCULAR INVASION: NOT IDENTIFIED. CANCER LENGTH: CARCINOMA PRESENT ON THREE CORES WITH 6.5 MM IN MAXIMAL LINER EXTENSION. CALCIFICATIONS: NOT IDENTIFIED. OTHER FINDINGS: NONE. Pathology results are CONCORDANT with imaging findings, per Dr. Edwin Cap. Pathology results and recommendations were discussed with patient via telephone on 08/30/2023. Patient reported biopsy site doing well with no adverse symptoms, and only slight tenderness at the site. Post biopsy care instructions were reviewed, questions were answered and my direct phone number was provided. Patient was instructed to call [facility] for any additional questions or concerns related to biopsy site. RECOMMENDATIONS: 1. Surgical and oncological consultation. Request for surgical and oncological consultation relayed to schedulers at [facility] on 08/30/2023 by Randa Lynn RN. Pathology results reported by Randa Lynn RN on 08/30/2023. Electronically Signed   By: Edwin Cap M.D.   On: 08/30/2023 12:17   Result Date: 08/30/2023 CLINICAL DATA:  82 year old female presents for ultrasound-guided core biopsy of a 0.5 cm mass in the left breast at the 3 o'clock position. EXAM: ULTRASOUND GUIDED LEFT BREAST CORE NEEDLE BIOPSY COMPARISON:  Previous exam(s). PROCEDURE: I met with the patient and we discussed the procedure of ultrasound-guided biopsy, including benefits and alternatives. We discussed the high likelihood of a successful procedure. We discussed the risks of the procedure, including infection, bleeding, tissue injury, clip migration, and inadequate sampling. Informed written consent was given. The usual time-out protocol was performed immediately prior to the procedure. Lesion quadrant: Upper-outer Using sterile technique and 1% Lidocaine as local anesthetic, under direct ultrasound visualization, a 14 gauge spring-loaded device was used to perform biopsy of the mass in the left breast at the 3  o'clock position using a lateral to medial approach. At the conclusion of the procedure a ribbon shaped tissue marker clip was deployed into the biopsy cavity. Follow up 2 view mammogram was performed and dictated separately. IMPRESSION: Ultrasound guided biopsy of the mass in the left breast at the 3 o'clock position. No apparent complications. Electronically Signed: By: Edwin Cap M.D. On: 08/29/2023 13:55   MM CLIP PLACEMENT LEFT  Result Date: 08/29/2023 CLINICAL DATA:  Post ultrasound-guided core biopsy of a mass in the left breast the 3 o'clock position. EXAM: 3D DIAGNOSTIC LEFT MAMMOGRAM POST ULTRASOUND BIOPSY COMPARISON:  Previous exam(s). FINDINGS: 3D Mammographic images were obtained following ultrasound-guided core biopsy  of a 0.5 cm mass in the left breast at the 3 o'clock position. A ribbon shaped biopsy marking clip is present at the site of the biopsied mass in the left breast at the 3 o'clock position. IMPRESSION: Ribbon shaped biopsy marking clip at site of biopsied mass in the left breast at the 3 o'clock position. Final Assessment: Post Procedure Mammograms for Marker Placement BI-RADS CATEGORY  28M: Post-Procedure Mammogram for Marker Placement Electronically Signed   By: Edwin Cap M.D.   On: 08/29/2023 13:56       IMPRESSION/PLAN: 1. Stage IA, [id number], grade 3 triple positive invasive ductal carcinoma of the left breast in the setting of ER/ PR positive breast cancer in 2014 as well as ER positive cancer in 2000 treated with lumpectomy and radiation. Dr. Mitzi Hansen discusses the pathology findings and reviews the nature of ***  We discussed the risks, benefits, short, and long term effects of radiotherapy, as well as the curative intent, and the patient is interested in proceeding. Dr. Mitzi Hansen discusses the delivery and logistics of radiotherapy and anticipates a course of ultrahypofractioned treatment, once weekly over 5 weeks of radiotherapy.    2. Possible genetic predisposition  to malignancy. The patient is a candidate for genetic testing given *** personal and family history. She will meet with our geneticist today in clinic.   In a visit lasting *** minutes, greater than 50% of the time was spent face to face reviewing her case, as well as in preparation of, discussing, and coordinating the patient's care.  The above documentation reflects my direct findings during this shared patient visit. Please see the separate note by Dr. Mitzi Hansen on this date for the remainder of the patient's plan of care.    Osker Mason, [facility]    **Disclaimer: This note was dictated with voice recognition software. Similar sounding words can inadvertently be transcribed and this note may contain transcription errors which may not have been corrected upon publication of note.**

## 2023-09-23 NOTE — Progress Notes (Signed)
New Breast Cancer Diagnosis: Left Breast  Patient presents with recurrent left breast cancer.   Histology per Pathology Report: grade 3, Invasive Ductal Carcinoma  Receptor Status: ER(positive), PR (positive), Her2-neu (positive), Ki-(30%)   Surgeon and surgical plan, if any:  Dr. Dwain Sarna -Left Breast Seed Guided Lumpectomy 09/30/2023   Medical oncologist, treatment if any:    Family History of Breast/Ovarian/Prostate Cancer:   Lymphedema issues, if any:      Pain issues, if any:     SAFETY ISSUES: Prior radiation? Left Breast 2000, ** fractions with Dr. Marland Kitchen Pacemaker/ICD?  Possible current pregnancy? Postmenopausal Is the patient on methotrexate?   Current Complaints / other details:

## 2023-09-24 ENCOUNTER — Ambulatory Visit
Admission: RE | Admit: 2023-09-24 | Discharge: 2023-09-24 | Disposition: A | Discharge status: Home / Self Care | Payer: Medicare PPO | Source: Ambulatory Visit | Attending: Radiation Oncology | Admitting: Radiation Oncology

## 2023-09-24 ENCOUNTER — Encounter: Payer: Self-pay | Admitting: Radiation Oncology

## 2023-09-24 ENCOUNTER — Encounter: Payer: Self-pay | Admitting: *Deleted

## 2023-09-24 ENCOUNTER — Encounter: Payer: Self-pay | Admitting: Family Medicine

## 2023-09-24 ENCOUNTER — Other Ambulatory Visit: Payer: Self-pay

## 2023-09-24 ENCOUNTER — Encounter (HOSPITAL_BASED_OUTPATIENT_CLINIC_OR_DEPARTMENT_OTHER)
Admission: RE | Admit: 2023-09-24 | Discharge: 2023-09-24 | Disposition: A | Discharge status: Home / Self Care | Payer: Medicare PPO | Source: Ambulatory Visit | Attending: General Surgery | Admitting: General Surgery

## 2023-09-24 VITALS — BP 156/54 | HR 68 | Temp 97.5°F | Resp 18 | Ht 61.0 in | Wt 161.4 lb

## 2023-09-24 DIAGNOSIS — Z803 Family history of malignant neoplasm of breast: Secondary | ICD-10-CM | POA: Insufficient documentation

## 2023-09-24 DIAGNOSIS — Z170 Estrogen receptor positive status [ER+]: Secondary | ICD-10-CM | POA: Diagnosis not present

## 2023-09-24 DIAGNOSIS — Z79899 Other long term (current) drug therapy: Secondary | ICD-10-CM | POA: Diagnosis not present

## 2023-09-24 DIAGNOSIS — Z923 Personal history of irradiation: Secondary | ICD-10-CM | POA: Insufficient documentation

## 2023-09-24 DIAGNOSIS — I10 Essential (primary) hypertension: Secondary | ICD-10-CM | POA: Diagnosis not present

## 2023-09-24 DIAGNOSIS — K449 Diaphragmatic hernia without obstruction or gangrene: Secondary | ICD-10-CM | POA: Insufficient documentation

## 2023-09-24 DIAGNOSIS — Z01818 Encounter for other preprocedural examination: Secondary | ICD-10-CM | POA: Insufficient documentation

## 2023-09-24 DIAGNOSIS — Z87891 Personal history of nicotine dependence: Secondary | ICD-10-CM | POA: Diagnosis not present

## 2023-09-24 DIAGNOSIS — C50412 Malignant neoplasm of upper-outer quadrant of left female breast: Secondary | ICD-10-CM | POA: Insufficient documentation

## 2023-09-24 LAB — BASIC METABOLIC PANEL
Anion gap: 9 (ref 5–15)
BUN: 27 mg/dL — ABNORMAL HIGH (ref 8–23)
CO2: 21 mmol/L — ABNORMAL LOW (ref 22–32)
Calcium: 9.1 mg/dL (ref 8.9–10.3)
Chloride: 105 mmol/L (ref 98–111)
Creatinine, Ser: 1.39 mg/dL — ABNORMAL HIGH (ref 0.44–1.00)
GFR, Estimated: 38 mL/min — ABNORMAL LOW (ref >60)
Glucose, Bld: 103 mg/dL — ABNORMAL HIGH (ref 70–99)
Potassium: 4.6 mmol/L (ref 3.5–5.1)
Sodium: 135 mmol/L (ref 135–145)

## 2023-09-24 MED ORDER — CHLORHEXIDINE GLUCONATE CLOTH 2 % EX PADS: 6.0000 | MEDICATED_PAD | Freq: Once | CUTANEOUS | Status: DC

## 2023-09-24 MED ORDER — ENSURE PRE-SURGERY PO LIQD: 296.0000 mL | Freq: Once | ORAL | Status: DC

## 2023-09-24 NOTE — Progress Notes (Signed)

## 2023-09-27 ENCOUNTER — Ambulatory Visit
Admission: RE | Admit: 2023-09-27 | Discharge: 2023-09-27 | Disposition: A | Discharge status: Home / Self Care | Payer: Medicare PPO | Source: Ambulatory Visit | Attending: General Surgery | Admitting: General Surgery

## 2023-09-27 ENCOUNTER — Other Ambulatory Visit: Payer: Self-pay | Admitting: General Surgery

## 2023-09-27 DIAGNOSIS — Z170 Estrogen receptor positive status [ER+]: Secondary | ICD-10-CM

## 2023-09-27 DIAGNOSIS — C50812 Malignant neoplasm of overlapping sites of left female breast: Secondary | ICD-10-CM | POA: Diagnosis not present

## 2023-09-27 HISTORY — PX: BREAST BIOPSY: SHX20

## 2023-09-30 ENCOUNTER — Ambulatory Visit (HOSPITAL_BASED_OUTPATIENT_CLINIC_OR_DEPARTMENT_OTHER): Payer: Medicare PPO | Admitting: Anesthesiology

## 2023-09-30 ENCOUNTER — Ambulatory Visit
Admission: RE | Admit: 2023-09-30 | Discharge: 2023-09-30 | Disposition: A | Discharge status: Home / Self Care | Payer: Medicare PPO | Source: Ambulatory Visit | Attending: General Surgery | Admitting: General Surgery

## 2023-09-30 ENCOUNTER — Ambulatory Visit (HOSPITAL_BASED_OUTPATIENT_CLINIC_OR_DEPARTMENT_OTHER)
Admission: RE | Admit: 2023-09-30 | Discharge: 2023-09-30 | Disposition: A | Discharge status: Home / Self Care | Payer: Medicare PPO | Attending: General Surgery | Admitting: General Surgery

## 2023-09-30 ENCOUNTER — Encounter (HOSPITAL_BASED_OUTPATIENT_CLINIC_OR_DEPARTMENT_OTHER): Payer: Self-pay | Admitting: General Surgery

## 2023-09-30 ENCOUNTER — Other Ambulatory Visit: Payer: Self-pay

## 2023-09-30 ENCOUNTER — Encounter (HOSPITAL_BASED_OUTPATIENT_CLINIC_OR_DEPARTMENT_OTHER)
Admission: RE | Disposition: A | Discharge status: Home / Self Care | Payer: Self-pay | Source: Home / Self Care | Attending: General Surgery

## 2023-09-30 DIAGNOSIS — C50412 Malignant neoplasm of upper-outer quadrant of left female breast: Secondary | ICD-10-CM | POA: Insufficient documentation

## 2023-09-30 DIAGNOSIS — Z1721 Progesterone receptor positive status: Secondary | ICD-10-CM | POA: Insufficient documentation

## 2023-09-30 DIAGNOSIS — C50812 Malignant neoplasm of overlapping sites of left female breast: Secondary | ICD-10-CM | POA: Diagnosis not present

## 2023-09-30 DIAGNOSIS — C50912 Malignant neoplasm of unspecified site of left female breast: Secondary | ICD-10-CM | POA: Diagnosis not present

## 2023-09-30 DIAGNOSIS — Z170 Estrogen receptor positive status [ER+]: Secondary | ICD-10-CM | POA: Diagnosis not present

## 2023-09-30 DIAGNOSIS — I10 Essential (primary) hypertension: Secondary | ICD-10-CM | POA: Insufficient documentation

## 2023-09-30 DIAGNOSIS — D0512 Intraductal carcinoma in situ of left breast: Secondary | ICD-10-CM | POA: Diagnosis not present

## 2023-09-30 DIAGNOSIS — Z87891 Personal history of nicotine dependence: Secondary | ICD-10-CM | POA: Diagnosis not present

## 2023-09-30 DIAGNOSIS — Z01818 Encounter for other preprocedural examination: Secondary | ICD-10-CM

## 2023-09-30 HISTORY — PX: BREAST LUMPECTOMY WITH RADIOACTIVE SEED LOCALIZATION: SHX6424

## 2023-09-30 SURGERY — BREAST LUMPECTOMY WITH RADIOACTIVE SEED LOCALIZATION
Anesthesia: General | Site: Breast | Laterality: Left

## 2023-09-30 MED ORDER — ONDANSETRON HCL 4 MG/2ML IJ SOLN
INTRAMUSCULAR | Status: AC
  Filled 2023-09-30: qty 2

## 2023-09-30 MED ORDER — SODIUM CHLORIDE 0.9 % IV SOLN: INTRAVENOUS | Status: DC | PRN

## 2023-09-30 MED ORDER — FENTANYL CITRATE (PF) 100 MCG/2ML IJ SOLN
INTRAMUSCULAR | Status: AC
  Filled 2023-09-30: qty 2

## 2023-09-30 MED ORDER — CIPROFLOXACIN IN D5W 400 MG/200ML IV SOLN
INTRAVENOUS | Status: AC
Start: 2023-09-30 — End: ?
  Filled 2023-09-30: qty 200

## 2023-09-30 MED ORDER — PHENYLEPHRINE HCL (PRESSORS) 10 MG/ML IV SOLN
INTRAVENOUS | Status: DC | PRN
  Administered 2023-09-30: 120 ug via INTRAVENOUS
  Administered 2023-09-30: 80 ug via INTRAVENOUS

## 2023-09-30 MED ORDER — ACETAMINOPHEN 500 MG PO TABS
1000.0000 mg | ORAL_TABLET | ORAL | Status: AC
  Administered 2023-09-30: 1000 mg via ORAL

## 2023-09-30 MED ORDER — PROPOFOL 10 MG/ML IV BOLUS
INTRAVENOUS | Status: DC | PRN
  Administered 2023-09-30: 140 mg via INTRAVENOUS

## 2023-09-30 MED ORDER — LACTATED RINGERS IV SOLN: INTRAVENOUS | Status: DC

## 2023-09-30 MED ORDER — LIDOCAINE HCL (CARDIAC) PF 100 MG/5ML IV SOSY
PREFILLED_SYRINGE | INTRAVENOUS | Status: DC | PRN
  Administered 2023-09-30: 40 mg via INTRAVENOUS

## 2023-09-30 MED ORDER — FENTANYL CITRATE (PF) 100 MCG/2ML IJ SOLN
25.0000 ug | INTRAMUSCULAR | Status: DC | PRN
Start: 2023-09-30 — End: 2023-09-30

## 2023-09-30 MED ORDER — DEXAMETHASONE SODIUM PHOSPHATE 10 MG/ML IJ SOLN
INTRAMUSCULAR | Status: DC | PRN
  Administered 2023-09-30: 5 mg via INTRAVENOUS

## 2023-09-30 MED ORDER — ONDANSETRON HCL 4 MG/2ML IJ SOLN
INTRAMUSCULAR | Status: DC | PRN
  Administered 2023-09-30: 4 mg via INTRAVENOUS

## 2023-09-30 MED ORDER — EPHEDRINE 5 MG/ML INJ
INTRAVENOUS | Status: AC
Start: 2023-09-30 — End: ?
  Filled 2023-09-30: qty 5

## 2023-09-30 MED ORDER — EPHEDRINE SULFATE (PRESSORS) 50 MG/ML IJ SOLN
INTRAMUSCULAR | Status: DC | PRN
  Administered 2023-09-30 (×2): 10 mg via INTRAVENOUS

## 2023-09-30 MED ORDER — BUPIVACAINE HCL (PF) 0.25 % IJ SOLN
INTRAMUSCULAR | Status: DC | PRN
  Administered 2023-09-30: 20 mL

## 2023-09-30 MED ORDER — PHENYLEPHRINE 80 MCG/ML (10ML) SYRINGE FOR IV PUSH (FOR BLOOD PRESSURE SUPPORT)
PREFILLED_SYRINGE | INTRAVENOUS | Status: AC
  Filled 2023-09-30: qty 10

## 2023-09-30 MED ORDER — ACETAMINOPHEN 500 MG PO TABS
ORAL_TABLET | ORAL | Status: AC
  Filled 2023-09-30: qty 2

## 2023-09-30 MED ORDER — AMISULPRIDE (ANTIEMETIC) 5 MG/2ML IV SOLN: 10.0000 mg | Freq: Once | INTRAVENOUS | Status: DC | PRN

## 2023-09-30 MED ORDER — ONDANSETRON HCL 4 MG/2ML IJ SOLN: 4.0000 mg | Freq: Once | INTRAMUSCULAR | Status: DC | PRN

## 2023-09-30 MED ORDER — FENTANYL CITRATE (PF) 100 MCG/2ML IJ SOLN
INTRAMUSCULAR | Status: DC | PRN
  Administered 2023-09-30: 50 ug via INTRAVENOUS

## 2023-09-30 MED ORDER — CIPROFLOXACIN IN D5W 400 MG/200ML IV SOLN
400.0000 mg | INTRAVENOUS | Status: AC
  Administered 2023-09-30: 400 mg via INTRAVENOUS

## 2023-09-30 MED ORDER — LIDOCAINE 2% (20 MG/ML) 5 ML SYRINGE
INTRAMUSCULAR | Status: AC
Start: 2023-09-30 — End: ?
  Filled 2023-09-30: qty 5

## 2023-09-30 MED ORDER — PROPOFOL 500 MG/50ML IV EMUL
INTRAVENOUS | Status: DC | PRN
  Administered 2023-09-30: 150 ug/kg/min via INTRAVENOUS

## 2023-09-30 MED ORDER — DEXAMETHASONE SODIUM PHOSPHATE 10 MG/ML IJ SOLN
INTRAMUSCULAR | Status: AC
  Filled 2023-09-30: qty 1

## 2023-09-30 SURGICAL SUPPLY — 57 items
ADH SKN CLS APL DERMABOND .7 (GAUZE/BANDAGES/DRESSINGS) ×1
APL PRP STRL LF DISP 70% ISPRP (MISCELLANEOUS) ×1
APPLIER CLIP 9.375 MED OPEN (MISCELLANEOUS)
APR CLP MED 9.3 20 MLT OPN (MISCELLANEOUS)
BINDER BREAST LRG (GAUZE/BANDAGES/DRESSINGS) IMPLANT
BINDER BREAST MEDIUM (GAUZE/BANDAGES/DRESSINGS) IMPLANT
BINDER BREAST XLRG (GAUZE/BANDAGES/DRESSINGS) IMPLANT
BINDER BREAST XXLRG (GAUZE/BANDAGES/DRESSINGS) IMPLANT
BLADE SURG 15 STRL LF DISP TIS (BLADE) ×1 IMPLANT
BLADE SURG 15 STRL SS (BLADE) ×1
CANISTER SUC SOCK COL 7IN (MISCELLANEOUS) IMPLANT
CANISTER SUCT 1200ML W/VALVE (MISCELLANEOUS) IMPLANT
CHLORAPREP W/TINT 26 (MISCELLANEOUS) ×1 IMPLANT
CLIP APPLIE 9.375 MED OPEN (MISCELLANEOUS) IMPLANT
CLIP TI WIDE RED SMALL 6 (CLIP) IMPLANT
COVER BACK TABLE 60X90IN (DRAPES) ×1 IMPLANT
COVER MAYO STAND STRL (DRAPES) ×1 IMPLANT
COVER PROBE CYLINDRICAL 5X96 (MISCELLANEOUS) ×1 IMPLANT
DERMABOND ADVANCED .7 DNX12 (GAUZE/BANDAGES/DRESSINGS) ×1 IMPLANT
DRAPE LAPAROSCOPIC ABDOMINAL (DRAPES) ×1 IMPLANT
DRAPE UTILITY XL STRL (DRAPES) ×1 IMPLANT
DRSG TEGADERM 4X4.75 (GAUZE/BANDAGES/DRESSINGS) IMPLANT
ELECT COATED BLADE 2.86 ST (ELECTRODE) ×1 IMPLANT
ELECT REM PT RETURN 9FT ADLT (ELECTROSURGICAL) ×1
ELECTRODE REM PT RTRN 9FT ADLT (ELECTROSURGICAL) ×1 IMPLANT
GAUZE SPONGE 4X4 12PLY STRL LF (GAUZE/BANDAGES/DRESSINGS) IMPLANT
GLOVE BIO SURGEON STRL SZ7 (GLOVE) ×2 IMPLANT
GLOVE BIOGEL PI IND STRL 6.5 (GLOVE) IMPLANT
GLOVE BIOGEL PI IND STRL 7.5 (GLOVE) ×1 IMPLANT
GLOVE ECLIPSE 6.5 STRL STRAW (GLOVE) IMPLANT
GOWN STRL REUS W/ TWL LRG LVL3 (GOWN DISPOSABLE) ×2 IMPLANT
GOWN STRL REUS W/TWL LRG LVL3 (GOWN DISPOSABLE) ×3
HEMOSTAT ARISTA ABSORB 3G PWDR (HEMOSTASIS) IMPLANT
KIT MARKER MARGIN INK (KITS) ×1 IMPLANT
NDL HYPO 25X1 1.5 SAFETY (NEEDLE) ×1 IMPLANT
NEEDLE HYPO 25X1 1.5 SAFETY (NEEDLE) ×1
NS IRRIG 1000ML POUR BTL (IV SOLUTION) IMPLANT
PACK BASIN DAY SURGERY FS (CUSTOM PROCEDURE TRAY) ×1 IMPLANT
PENCIL SMOKE EVACUATOR (MISCELLANEOUS) ×1 IMPLANT
RETRACTOR ONETRAX LX 90X20 (MISCELLANEOUS) IMPLANT
SLEEVE SCD COMPRESS KNEE MED (STOCKING) ×1 IMPLANT
SPIKE FLUID TRANSFER (MISCELLANEOUS) IMPLANT
SPONGE T-LAP 4X18 ~~LOC~~+RFID (SPONGE) ×1 IMPLANT
STRIP CLOSURE SKIN 1/2X4 (GAUZE/BANDAGES/DRESSINGS) ×1 IMPLANT
SUT MNCRL AB 4-0 PS2 18 (SUTURE) ×1 IMPLANT
SUT MON AB 5-0 PS2 18 (SUTURE) IMPLANT
SUT SILK 2 0 SH (SUTURE) IMPLANT
SUT VIC AB 2-0 SH 27 (SUTURE) ×2
SUT VIC AB 2-0 SH 27XBRD (SUTURE) ×1 IMPLANT
SUT VIC AB 3-0 SH 27 (SUTURE) ×1
SUT VIC AB 3-0 SH 27X BRD (SUTURE) ×1 IMPLANT
SUT VIC AB 5-0 PS2 18 (SUTURE) IMPLANT
SYR CONTROL 10ML LL (SYRINGE) ×1 IMPLANT
TOWEL GREEN STERILE FF (TOWEL DISPOSABLE) ×1 IMPLANT
TRAY FAXITRON CT DISP (TRAY / TRAY PROCEDURE) ×1 IMPLANT
TUBE CONNECTING 20X1/4 (TUBING) IMPLANT
YANKAUER SUCT BULB TIP NO VENT (SUCTIONS) IMPLANT

## 2023-09-30 NOTE — Interval H&P Note (Signed)
History and Physical Interval Note:  09/30/2023 8:21 AM  Misty Wilkerson  has presented today for surgery, with the diagnosis of LEFT BREAST CANCER.  The various methods of treatment have been discussed with the patient and family. After consideration of risks, benefits and other options for treatment, the patient has consented to  Procedure(s) with comments: LEFT BREAST SEED GUIDED LUMPECTOMY (Left) - LMA as a surgical intervention.  The patient's history has been reviewed, patient examined, no change in status, stable for surgery.  I have reviewed the patient's chart and labs.  Questions were answered to the patient's satisfaction.     [name]

## 2023-09-30 NOTE — Anesthesia Procedure Notes (Signed)
Procedure Name: LMA Insertion Date/Time: 09/30/2023 8:42 AM  Performed by: Thornell Mule, CRNAPre-anesthesia Checklist: Patient identified, Emergency Drugs available, Suction available and Patient being monitored Patient Re-evaluated:Patient Re-evaluated prior to induction Oxygen Delivery Method: Circle system utilized Preoxygenation: Pre-oxygenation with 100% oxygen Induction Type: IV induction LMA: LMA inserted LMA Size: 4.0 Number of attempts: 1 Placement Confirmation: positive ETCO2 Tube secured with: Tape Dental Injury: Teeth and Oropharynx as per pre-operative assessment

## 2023-09-30 NOTE — Anesthesia Preprocedure Evaluation (Addendum)
Anesthesia Evaluation  Patient identified by MRN, date of birth, ID band Patient awake    Reviewed: Allergy & Precautions, NPO status , Patient's Chart, lab work & pertinent test results  Airway Mallampati: III  TM Distance: >3 FB Neck ROM: Full    Dental no notable dental hx.    Pulmonary former smoker   Pulmonary exam normal        Cardiovascular hypertension, Pt. on medications Normal cardiovascular exam     Neuro/Psych Vertigo negative neurological ROS  negative psych ROS   GI/Hepatic Neg liver ROS, hiatal hernia,,,  Endo/Other  negative endocrine ROS    Renal/GU Renal disease     Musculoskeletal negative musculoskeletal ROS (+)    Abdominal  (+) + obese  Peds  Hematology negative hematology ROS (+)   Anesthesia Other Findings LEFT BREAST CANCER  Reproductive/Obstetrics                             Anesthesia Physical Anesthesia Plan  ASA: 2  Anesthesia Plan: General   Post-op Pain Management:    Induction: Intravenous  PONV Risk Score and Plan: 3 and Ondansetron, Dexamethasone, Treatment may vary due to age or medical condition and Propofol infusion  Airway Management Planned: LMA  Additional Equipment:   Intra-op Plan:   Post-operative Plan: Extubation in OR  Informed Consent: I have reviewed the patients History and Physical, chart, labs and discussed the procedure including the risks, benefits and alternatives for the proposed anesthesia with the patient or authorized representative who has indicated his/her understanding and acceptance.     Dental advisory given  Plan Discussed with: CRNA  Anesthesia Plan Comments:        Anesthesia Quick Evaluation

## 2023-09-30 NOTE — Transfer of Care (Signed)
Immediate Anesthesia Transfer of Care Note  Patient: Misty Wilkerson  Procedure(s) Performed: LEFT BREAST SEED GUIDED LUMPECTOMY (Left: Breast)  Patient Location: PACU  Anesthesia Type:General  Level of Consciousness: drowsy, patient cooperative, and responds to stimulation  Airway & Oxygen Therapy: Patient Spontanous Breathing and Patient connected to face mask oxygen  Post-op Assessment: Report given to RN and Post -op Vital signs reviewed and stable  Post vital signs: Reviewed and stable  Last Vitals:  Vitals Value Taken Time  BP 125/56 09/30/23 0946  Temp 36.1 C 09/30/23 0930  Pulse 69 09/30/23 0952  Resp 11 09/30/23 0952  SpO2 98 % 09/30/23 0952  Vitals shown include unfiled device data.  Last Pain:  Vitals:   09/30/23 0945  TempSrc:   PainSc: 0-No pain         Complications: No notable events documented.

## 2023-09-30 NOTE — H&P (Signed)
This is an 82 year old female who was first treated in the year 2000 for a left breast cancer with lumpectomy and radiotherapy. She did well until 2014 where I repeated a lumpectomy for an ER/PR positive tumor. I attempted a sentinel lymph node biopsy at that time but was unable to get this to map. She has done well until she had a recent screening mammogram. She still has B density breast tissue. She had a left breast possible asymmetry noted. On ultrasound there is a mass measuring 5 x 4 x 5 mm. There are normal axillary nodes. Biopsy shows a grade 3 invasive ductal carcinoma that is 100% ER positive, 90% PR positive, HER2 positive, and Ki-67 is 30%. She is here with her husband today to discuss options.  She is still quite active. Review of Systems: A complete review of systems was obtained from the patient. I have reviewed this information and discussed as appropriate with the patient. See HPI as well for other ROS.  Review of Systems  All other systems reviewed and are negative.  Medical History: Past Medical History:  Diagnosis Date  History of cancer  Hypertension   History reviewed. No pertinent surgical history.   Allergies  Allergen Reactions  Penicillin Other (See Comments)  WHEEZING  Tetracycline Rash   Current Outpatient Medications on File Prior to Visit  Medication Sig Dispense Refill  cholecalciferol (VITAMIN D3) 1000 unit tablet Take 2,000 Units by mouth once daily  cyanocobalamin (VITAMIN B12) 1000 MCG tablet Take 1,000 mcg by mouth once daily  valsartan (DIOVAN) 160 MG tablet Take 160 mg by mouth once daily   Family History  Problem Relation Age of Onset  High blood pressure (Hypertension) Mother  Diabetes Father  Hyperlipidemia (Elevated cholesterol) Father  Skin cancer Father  High blood pressure (Hypertension) Father  Breast cancer Sister  High blood pressure (Hypertension) Sister  Hyperlipidemia (Elevated cholesterol) Sister  Diabetes Sister  High  blood pressure (Hypertension) Brother  Hyperlipidemia (Elevated cholesterol) Brother  Diabetes Brother    Social History   Tobacco Use  Smoking Status Former  Types: Cigarettes  Smokeless Tobacco Never  Marital status: Married  Tobacco Use  Smoking status: Former  Types: Cigarettes  Smokeless tobacco: Never  Vaping Use  Vaping status: Never Used  Alcohol use: Yes  Drug use: Never  Marital Status: Married   Objective:   Vitals:  09/06/23 1123  BP: (!) 181/76  Pulse: 88  Temp: 36.6 C (97.9 F)  SpO2: 100%  Weight: 73.1 kg (161 lb 3.2 oz)  Height: 154.9 cm (5\' 1" )  PainSc: 0-No pain   Body mass index is 30.46 kg/m.  Physical Exam Vitals reviewed.  Constitutional:  Appearance: Normal appearance.  Chest:  Breasts: Right: No inverted nipple, mass or nipple discharge.  Left: No inverted nipple, mass or nipple discharge.  Comments: Multiple left breast incisions healed Lymphadenopathy:  Upper Body:  Right upper body: No supraclavicular or axillary adenopathy.  Left upper body: No supraclavicular or axillary adenopathy.  Neurological:  Mental Status: She is alert.   Assessment and Plan:   Malignant neoplasm of upper-outer quadrant of left breast in female, estrogen receptor positive (CMS/HHS-HCC)  Left breast seed guided lumpectomy  We discussed that this tumor is different than the previous ones due to to the HER2 positivity. I am going to get her in to see oncology as soon as possible. Her main goal is functionality and quality of life. I recommended her proceeding with a mastectomy on the left side  is the best option to lower her recurrence rate. She is hesitant to proceed with that. We discussed mastectomy as well as the risks and the recovery associated with it. I told her that this would give her the best chance of avoiding a local recurrence. She is interested in proceeding with a lumpectomy again. We discussed a seed guided lumpectomy which I have done  before on her. We discussed that her local recurrence rate certainly would likely be higher. We discussed positive margin risk necessitating additional surgery is as well. I do not think another attempt at a node biopsy is really indicated given the fact that I could not redo 1 before. I am not sure even of the validity of this if I were able to do it at this point. I think that we will treat her as no negative given the ultrasound. She is fine with that plan. I am going to have her see oncology and then we will decide the final plan.  She does just want to proceed with lumpectomy and understands higher recurrence and this is not standard therapy at this point.  She will do likely ai and sq herceptin.

## 2023-09-30 NOTE — Op Note (Signed)
Preoperative diagnosis: Clinical stage I left breast cancer Postoperative diagnosis: Same as above Procedure: Left breast radioactive seed guided lumpectomy Surgeon: Dr. Harden Mo Anesthesia: General Complications: Drains: None Estimated blood loss: Minimal Specimens: 1.  Left breast tissue containing seed and clip marked with paint 2.  Additional posterior, superior, lateral margins marked short stitch superior, long stitch lateral, double stitch deep Sponge needle count was correct at completion Disposition recovery stable condition  Indications:82 year old female who was first treated in the year 2000 for a left breast cancer with lumpectomy and radiotherapy. She did well until 2014 where I repeated a lumpectomy for an ER/PR positive tumor. I attempted a sentinel lymph node biopsy at that time but was unable to get this to map. She has done well until she had a recent screening mammogram.  She had a left breast possible asymmetry noted. On ultrasound there is a mass measuring 5 x 4 x 5 mm. There are normal axillary nodes. Biopsy shows a grade 3 invasive ductal carcinoma that is 100% ER positive, 90% PR positive, HER2 positive, and Ki-67 is 30%.  She is elected to attempt another lumpectomy on this side.  She is seeing oncology preoperatively and has a planned with them as well.  Procedure: After informed consent was obtained she was taken to the operative room.  She was given antibiotics.  SCDs were in place.  She was placed under general anesthesia without complication.  She was prepped and draped in a standard sterile surgical fashion.  A surgical timeout was then performed.  I infiltrated Marcaine in the lateral area of the breast.  I located the seed with the neoprobe.  I then made a curvilinear incision overlying the seed.  I then remove the breast tissue and the seed with an attempt to get a rim of normal tissue around it.  Mammography come firmed removal of the clip and the seed.  I  did 3D imaging and thought I might be close to several margins so I excised these as above.  The posterior margin is now the muscle.  I then obtained hemostasis.  I closed down the breast tissue with multiple 2-0 Vicryl sutures to obliterate the space.  I then closed the skin with 3-0 Vicryl and 4-0 Monocryl.  Glue and Steri-Strips were applied.  She tolerated this well was extubated and transferred recovery stable.

## 2023-09-30 NOTE — Anesthesia Postprocedure Evaluation (Signed)
Anesthesia Post Note  Patient: Misty Wilkerson  Procedure(s) Performed: LEFT BREAST SEED GUIDED LUMPECTOMY (Left: Breast)     Patient location during evaluation: PACU Anesthesia Type: General Level of consciousness: awake Pain management: pain level controlled Vital Signs Assessment: post-procedure vital signs reviewed and stable Respiratory status: spontaneous breathing, nonlabored ventilation and respiratory function stable Cardiovascular status: blood pressure returned to baseline and stable Postop Assessment: no apparent nausea or vomiting Anesthetic complications: no   No notable events documented.  Last Vitals:  Vitals:   09/30/23 0945 09/30/23 1004  BP: (!) 125/56 (!) 126/53  Pulse: 79 78  Resp: 16 18  Temp:  (!) 36.2 C  SpO2: 97% 99%    Last Pain:  Vitals:   09/30/23 1004  TempSrc: Tympanic  PainSc: 0-No pain                 Amazing Cowman P Yue Glasheen

## 2023-09-30 NOTE — Discharge Instructions (Addendum)
[facility] Office Phone Number [phone]  POST OP INSTRUCTIONS Take 400 mg of ibuprofen every 8 hours or 650 mg tylenol every 6 hours for next 72 hours then as needed. Use ice several times daily also.  A prescription for pain medication may be given to you upon discharge.  Take your pain medication as prescribed, if needed.  If narcotic pain medicine is not needed, then you may take acetaminophen (Tylenol), naprosyn (Alleve) or ibuprofen (Advil) as needed. Take your usually prescribed medications unless otherwise directed If you need a refill on your pain medication, please contact your pharmacy.  They will contact our office to request authorization.  Prescriptions will not be filled after 5pm or on week-ends. You should eat very light the first 24 hours after surgery, such as soup, crackers, pudding, etc.  Resume your normal diet the day after surgery. Most patients will experience some swelling and bruising in the breast.  Ice packs and a good support bra will help.  Wear the breast binder provided or a sports bra for 72 hours day and night.  After that wear a sports bra during the day until you return to the office. Swelling and bruising can take several days to resolve.  It is common to experience some constipation if taking pain medication after surgery.  Increasing fluid intake and taking a stool softener will usually help or prevent this problem from occurring.  A mild laxative (Milk of Magnesia or Miralax) should be taken according to package directions if there are no bowel movements after 48 hours. I used skin glue on the incision, you may shower in 24 hours.  The glue will flake off over the next 2-3 weeks.  Any sutures or staples will be removed at the office during your follow-up visit. ACTIVITIES:  You may resume regular daily activities (gradually increasing) beginning the next day.  Wearing a good support bra or sports bra minimizes pain and swelling.  You may have  sexual intercourse when it is comfortable. You may drive when you no longer are taking prescription pain medication, you can comfortably wear a seatbelt, and you can safely maneuver your car and apply brakes. RETURN TO WORK:  ______________________________________________________________________________________ Misty Wilkerson should see your doctor in the office for a follow-up appointment approximately two weeks after your surgery.  Your doctor's nurse will typically make your follow-up appointment when she calls you with your pathology report.  Expect your pathology report 3-4 business days after your surgery.  You may call to check if you do not hear from [place] after three days. OTHER INSTRUCTIONS: _______________________________________________________________________________________________ _____________________________________________________________________________________________________________________________________ _____________________________________________________________________________________________________________________________________ _____________________________________________________________________________________________________________________________________  WHEN TO CALL DR WAKEFIELD: Fever over 101.0 Nausea and/or vomiting. Extreme swelling or bruising. Continued bleeding from incision. Increased pain, redness, or drainage from the incision.  The clinic staff is available to answer your questions during regular business hours.  Please don't hesitate to call and ask to speak to one of the nurses for clinical concerns.  If you have a medical emergency, go to the nearest emergency room or call 911.  A surgeon from [facility] is always on call at the hospital.  For further questions, please visit [email or web address]  You may have Tylenol after 1:30pm today, if needed.   Post Anesthesia Home Care Instructions  Activity: Get plenty of rest for the  remainder of the day. A responsible individual must stay with you for 24 hours following the procedure.  For the next 24 hours, DO NOT: -Drive a car -Operate  machinery -Drink alcoholic beverages -Take any medication unless instructed by your physician -Make any legal decisions or sign important papers.  Meals: Start with liquid foods such as gelatin or soup. Progress to regular foods as tolerated. Avoid greasy, spicy, heavy foods. If nausea and/or vomiting occur, drink only clear liquids until the nausea and/or vomiting subsides. Call your physician if vomiting continues.  Special Instructions/Symptoms: Your throat may feel dry or sore from the anesthesia or the breathing tube placed in your throat during surgery. If this causes discomfort, gargle with warm salt water. The discomfort should disappear within 24 hours.  If you had a scopolamine patch placed behind your ear for the management of post- operative nausea and/or vomiting:  1. The medication in the patch is effective for 72 hours, after which it should be removed.  Wrap patch in a tissue and discard in the trash. Wash hands thoroughly with soap and water. 2. You may remove the patch earlier than 72 hours if you experience unpleasant side effects which may include dry mouth, dizziness or visual disturbances. 3. Avoid touching the patch. Wash your hands with soap and water after contact with the patch.

## 2023-10-01 ENCOUNTER — Encounter (HOSPITAL_BASED_OUTPATIENT_CLINIC_OR_DEPARTMENT_OTHER): Payer: Self-pay | Admitting: General Surgery

## 2023-10-02 LAB — SURGICAL PATHOLOGY

## 2023-10-04 ENCOUNTER — Other Ambulatory Visit: Payer: Self-pay | Admitting: *Deleted

## 2023-10-04 DIAGNOSIS — Z170 Estrogen receptor positive status [ER+]: Secondary | ICD-10-CM

## 2023-10-07 ENCOUNTER — Encounter: Payer: Self-pay | Admitting: *Deleted

## 2023-10-07 ENCOUNTER — Ambulatory Visit: Payer: Medicare PPO | Admitting: Hematology and Oncology

## 2023-10-10 ENCOUNTER — Encounter: Payer: Self-pay | Admitting: Hematology and Oncology

## 2023-10-10 ENCOUNTER — Inpatient Hospital Stay: Payer: Medicare PPO | Attending: Hematology and Oncology | Admitting: Hematology and Oncology

## 2023-10-10 VITALS — BP 150/54 | HR 77 | Temp 97.3°F | Resp 18 | Wt 162.1 lb

## 2023-10-10 DIAGNOSIS — C50412 Malignant neoplasm of upper-outer quadrant of left female breast: Secondary | ICD-10-CM | POA: Insufficient documentation

## 2023-10-10 DIAGNOSIS — C50212 Malignant neoplasm of upper-inner quadrant of left female breast: Secondary | ICD-10-CM | POA: Diagnosis not present

## 2023-10-10 DIAGNOSIS — Z170 Estrogen receptor positive status [ER+]: Secondary | ICD-10-CM | POA: Insufficient documentation

## 2023-10-10 NOTE — Assessment & Plan Note (Addendum)
This is a 82 year old female patient with past medical history significant for left breast cancer in 2000, 2014 now presents with new onset left breast invasive ductal carcinoma, grade 3, ER/PR and HER2 amplified follows up with Dr. Pamelia Hoit, seen today by me while Dr. Pamelia Hoit is away.  Patient has followed up with Dr. Dwain Sarna and the recommendation was to consider mastectomy given the HER2 amplified breast cancer but patient is strongly opposed to it.  We have today reviewed about consideration for lumpectomy with reirradiation and referral to radiation oncology locally since she is not willing to consider mastectomy especially because she is worried about her quality of life.  She is now status post lumpectomy, final pathology showed grade 2 IDC measuring 1 x 0.8 x 0.5 cm, negative margins, no sentinel lymph nodes submitted. She will undergo reirradiation of the left breast, she prefers to start after Christmas.  We have once again discussed the role of adjuvant Herceptin.  I have reassured her that she will tolerate Herceptin extremely well, will continue to monitor her echocardiograms every 3 months.  Will proceed with a subcu Herceptin every 3 weeks for 1 year.  After much discussion and per patient preference, we have decided to omit chemotherapy.  I will also talk to her about getting back on letrozole given the cancer being triple positive.  She wants to start Herceptin as well after Christmas.   Total time: 30 min

## 2023-10-10 NOTE — Progress Notes (Signed)
[facility] CONSULT NOTE  Patient Care Team: [name], MD as PCP - General (Family Medicine) Pershing Proud, RN as Oncology Nurse Navigator Donnelly Angelica, RN as Oncology Nurse Navigator Serena Croissant, MD as Consulting Physician (Hematology and Oncology)  CHIEF COMPLAINTS/PURPOSE OF CONSULTATION:  Newly diagnosed breast cancer  HISTORY OF PRESENTING ILLNESS:  Misty Wilkerson 82 y.o. female is here because of recent diagnosis of left breast cancer  I reviewed her records extensively and collaborated the history with the patient.  SUMMARY OF ONCOLOGIC HISTORY: Oncology History  Breast cancer of upper-inner quadrant of left female breast (HCC)  12/08/1998 Initial Biopsy   Left breast cancer: Lumpectomy followed by radiation T1 BN 0M0 could not tolerate tamoxifen or alternative therapies.   08/12/2013 Surgery   Left breast lumpectomy: Tumor 1  invasive ductal carcinoma 0.4 cm with DCIS, tumor to: IDC 1.2 cm with DCIS positive margin, ER 98%, PR 14%, HER-2 negative, Ki-67 19% margins reexcision negative   09/07/2013 -  Anti-estrogen oral therapy   Letrozole 2.5 mg daily plans for 5 years   Breast cancer of upper-outer quadrant of left female breast (HCC)  09/10/2023 Initial Diagnosis   Breast cancer of upper-outer quadrant of left female breast (HCC)   09/10/2023 Cancer Staging   Staging form: Breast, AJCC 8th Edition - Clinical: Stage IA (cT1, cN0, cM0, G3, ER+, PR+, HER2+) - Signed by Rachel Moulds, MD on 09/10/2023 Stage prefix: Initial diagnosis Histologic grading system: 3 grade system   11/26/2023 - 11/26/2023 Chemotherapy   Patient is on Treatment Plan : BREAST Paclitaxel + Trastuzumab q7d / Trastuzumab q21d      The patient, an 82 year old woman with a history of breast cancer, presents with a new breast mass. This is her third occurrence of breast cancer, with previous diagnoses in 2000 and 2014.  Since her last visit here she had left breast  lumpectomy, final pathology showed invasive ductal carcinoma measuring 1 x 0.8 x 0.5 cm, grade 2, DCIS focal solid type grade 2, negative margins for both invasive and DCIS.  All margins are negative for malignancy.  No lymph node specimen was submitted.  Prognostics from previous breast biopsy showed ER +100% strong PR +100% strong and HER2 positive by FISH with Ki-67 of 30%.  She will proceed with adjuvant radiation after Christmas according to her preference.  MEDICAL HISTORY:  Past Medical History:  Diagnosis Date   Arthritis    Cancer (HCC)    left breast   H/O hiatal hernia    eats slowly  now   Hypertension    Personal history of radiation therapy 2002   left breast   Vertigo     SURGICAL HISTORY: Past Surgical History:  Procedure Laterality Date   BREAST BIOPSY Left 08/29/2023   [place] LT BREAST BX W LOC DEV 1ST LESION IMG BX SPEC US GUIDE 08/29/2023 [facility]   BREAST BIOPSY Left 09/27/2023   [place] LT RADIOACTIVE SEED LOC 09/27/2023 [facility]   BREAST LUMPECTOMY Left 2002, 2014   BREAST LUMPECTOMY WITH RADIOACTIVE SEED LOCALIZATION Left 09/30/2023   Procedure: LEFT BREAST SEED GUIDED LUMPECTOMY;  Surgeon: [name], MD;  Location: [facility];  Service: General;  Laterality: Left;  LMA   CHOLECYSTECTOMY     COLONOSCOPY N/A 10/26/2015   Procedure: COLONOSCOPY;  Surgeon: Malissa Hippo, MD;  Location: [facility];  Service: Endoscopy;  Laterality: N/A;  1200   DEBRIDEMENT AND CLOSURE WOUND Left 03/25/2018   Procedure:  DEBRIDEMENTINTERPHALANGEAL LEFT INDEX FINGER;  Surgeon: Cindee Salt, MD;  Location: [facility];  Service: Orthopedics;  Laterality: Left;   EXCISION MASS UPPER EXTREMETIES Left 03/25/2018   Procedure: EXCISION MASS LEFT INDEX;  Surgeon: Cindee Salt, MD;  Location: [facility];  Service: Orthopedics;  Laterality: Left;   EYE SURGERY     bilateral cataracts   MAXILLARY ANTROSTOMY Left 12/25/2021    Procedure: LEFT MAXILLARY ANTROSTOMY WITH TISSUE REMOVAL;  Surgeon: Newman Pies, MD;  Location:  SURGERY CENTER;  Service: ENT;  Laterality: Left;   PARTIAL MASTECTOMY WITH NEEDLE LOCALIZATION AND AXILLARY SENTINEL LYMPH NODE BX Left 08/12/2013   Procedure: LEFT PARTIAL MASTECTOMY WITH NEEDLE LOCALIZATION ATTEMPTED AXILLARY SENTINEL LYMPH NODE BIOPSY;  Surgeon: [name], MD;  Location: [facility];  Service: General;  Laterality: Left;   RE-EXCISION OF BREAST LUMPECTOMY Left 08/27/2013   Procedure: RE-EXCISION OF BREAST LUMPECTOMY;  Surgeon: [name], MD;  Location: [facility];  Service: General;  Laterality: Left;    SOCIAL HISTORY: Social History   Socioeconomic History   Marital status: Married    Spouse name: Kathlene November   Number of children: 2   Years of education: Not on file   Highest education level: Bachelor's degree (e.g., BA, AB, BS)  Occupational History   Occupation: retired  Tobacco Use   Smoking status: Former    Current packs/day: 0.00    Average packs/day: 0.5 packs/day for 15.0 years (7.5 ttl pk-yrs)    Types: Cigarettes    Start date: 07/21/1984    Quit date: 07/22/1999    Years since quitting: 24.2   Smokeless tobacco: Never  Vaping Use   Vaping status: Never Used  Substance and Sexual Activity   Alcohol use: Yes    Comment: one alcohol drink daily   Drug use: No   Sexual activity: Yes  Other Topics Concern   Not on file  Social History Narrative   Very independent and pretty active   Social Determinants of Health   Financial Resource Strain: Low Risk  (02/15/2023)   Overall Financial Resource Strain (CARDIA)    Difficulty of Paying Living Expenses: Not hard at all  Food Insecurity: No Food Insecurity (09/24/2023)   Hunger Vital Sign    Worried About Running Out of Food in the Last Year: Never true    Ran Out of Food in the Last Year: Never true  Transportation Needs: No Transportation Needs (09/24/2023)   PRAPARE - Scientist, research (physical sciences) (Medical): No    Lack of Transportation (Non-Medical): No  Physical Activity: Sufficiently Active (02/15/2023)   Exercise Vital Sign    Days of Exercise per Week: 5 days    Minutes of Exercise per Session: 30 min  Recent Concern: Physical Activity - Insufficiently Active (12/07/2022)   Exercise Vital Sign    Days of Exercise per Week: 3 days    Minutes of Exercise per Session: 30 min  Stress: No Stress Concern Present (02/15/2023)   [employer] of Occupational Health - Occupational Stress Questionnaire    Feeling of Stress : Only a little  Social Connections: Socially Integrated (02/15/2023)   Social Connection and Isolation Panel [NHANES]    Frequency of Communication with Friends and Family: More than three times a week    Frequency of Social Gatherings with Friends and Family: Once a week    Attends Religious Services: More than 4 times per year    Active Member of [employer] or Organizations: Yes  Attends Banker Meetings: More than 4 times per year    Marital Status: Married  Catering manager Violence: Not At Risk (09/24/2023)   Humiliation, Afraid, Rape, and Kick questionnaire    Fear of Current or Ex-Partner: No    Emotionally Abused: No    Physically Abused: No    Sexually Abused: No    FAMILY HISTORY: Family History  Problem Relation Age of Onset   Hypertension Mother    Diabetes Father    Stroke Father    Breast cancer Sister 28   Breast cancer Sister    Aneurysm Sister    Cancer Brother     ALLERGIES:  is allergic to penicillins and tetracyclines & related.  MEDICATIONS:  Current Outpatient Medications  Medication Sig Dispense Refill   cholecalciferol (VITAMIN D3) 25 MCG (1000 UNIT) tablet Take 2 tablets (2,000 Units total) by mouth daily.     Multiple Vitamin (MULTIVITAMIN) tablet Take 1 tablet by mouth daily.     triamterene-hydrochlorothiazide (MAXZIDE-25) 37.5-25 MG tablet Take 0.5 tablets by mouth daily. 45 tablet 3    valsartan (DIOVAN) 160 MG tablet TAKE 1 TABLET BY MOUTH DAILY FOR BLOOD PRESSURE 90 tablet 1   vitamin B-12 (CYANOCOBALAMIN) 1000 MCG tablet Take 1,000 mcg by mouth daily.     No current facility-administered medications for this visit.     PHYSICAL EXAMINATION: ECOG PERFORMANCE STATUS: 0 - Asymptomatic  Vitals:   10/10/23 1342  BP: (!) 150/54  Pulse: 77  Resp: 18  Temp: (!) 97.3 F (36.3 C)  SpO2: 100%   Filed Weights   10/10/23 1342  Weight: 162 lb 1.6 oz (73.5 kg)    GENERAL:alert, no distress and comfortable  LABORATORY DATA:  I have reviewed the data as listed Lab Results  Component Value Date   WBC 6.0 08/06/2023   HGB 13.6 08/06/2023   HCT 41.8 08/06/2023   MCV 97 08/06/2023   PLT 239 08/06/2023   Lab Results  Component Value Date   NA 135 09/24/2023   K 4.6 09/24/2023   CL 105 09/24/2023   CO2 21 (L) 09/24/2023    RADIOGRAPHIC STUDIES: I have personally reviewed the radiological reports and agreed with the findings in the report.  ASSESSMENT AND PLAN:  Breast cancer of upper-inner quadrant of left female breast This is a 82 year old female patient with past medical history significant for left breast cancer in 2000, 2014 now presents with new onset left breast invasive ductal carcinoma, grade 3, ER/PR and HER2 amplified follows up with Dr. Pamelia Hoit, seen today by me while Dr. Pamelia Hoit is away.  Patient has followed up with Dr. Dwain Sarna and the recommendation was to consider mastectomy given the HER2 amplified breast cancer but patient is strongly opposed to it.  We have today reviewed about consideration for lumpectomy with reirradiation and referral to radiation oncology locally since she is not willing to consider mastectomy especially because she is worried about her quality of life.  She is now status post lumpectomy, final pathology showed grade 2 IDC measuring 1 x 0.8 x 0.5 cm, negative margins, no sentinel lymph nodes submitted. She will undergo  reirradiation of the left breast, she prefers to start after Christmas.  We have once again discussed the role of adjuvant Herceptin.  I have reassured her that she will tolerate Herceptin extremely well, will continue to monitor her echocardiograms every 3 months.  Will proceed with a subcu Herceptin every 3 weeks for 1 year.  After much discussion and  per patient preference, we have decided to omit chemotherapy.  I will also talk to her about getting back on letrozole given the cancer being triple positive.  She wants to start Herceptin as well after Christmas.   Total time: 30 min    All questions were answered. The patient knows to call the clinic with any problems, questions or concerns.    Rachel Moulds, MD 10/10/23

## 2023-10-10 NOTE — Progress Notes (Signed)
START ON PATHWAY REGIMEN - Breast     Cycle 1: A cycle is 7 days:     Trastuzumab-xxxx      Paclitaxel    Cycles 2 through 12: A cycle is every 7 days:     Trastuzumab-xxxx      Paclitaxel    Cycles 13 through 25: A cycle is every 21 days:     Trastuzumab-xxxx   **Always confirm dose/schedule in your pharmacy ordering system**  Patient Characteristics: Postoperative without Neoadjuvant Therapy, M0 (Pathologic Staging), Invasive Disease, Adjuvant Therapy, HER2 Positive, ER Positive, Node Negative, pT1b, pN0/N39mi, Chemotherapy Indicated Therapeutic Status: Postoperative without Neoadjuvant Therapy, M0 (Pathologic Staging) AJCC Grade: G2 AJCC N Category: pNX AJCC M Category: cM0 ER Status: Positive (+) AJCC 8 Stage Grouping: Unknown HER2 Status: Positive (+) Oncotype Dx Recurrence Score: Not Appropriate AJCC T Category: pT1 PR Status: Positive (+) Intervention Indicated: Chemotherapy Intent of Therapy: Curative Intent, Discussed with Patient

## 2023-10-11 ENCOUNTER — Encounter: Payer: Self-pay | Admitting: *Deleted

## 2023-10-11 ENCOUNTER — Other Ambulatory Visit: Payer: Self-pay | Admitting: Pharmacist

## 2023-10-11 ENCOUNTER — Telehealth: Payer: Self-pay | Admitting: Hematology and Oncology

## 2023-10-11 ENCOUNTER — Encounter: Payer: Self-pay | Admitting: Hematology and Oncology

## 2023-10-11 ENCOUNTER — Other Ambulatory Visit: Payer: Self-pay | Admitting: Hematology and Oncology

## 2023-10-11 DIAGNOSIS — C50412 Malignant neoplasm of upper-outer quadrant of left female breast: Secondary | ICD-10-CM

## 2023-10-11 NOTE — Telephone Encounter (Signed)
Spoke with patient confirming upcoming appointment  

## 2023-10-13 ENCOUNTER — Other Ambulatory Visit: Payer: Self-pay

## 2023-10-16 ENCOUNTER — Encounter: Payer: Self-pay | Admitting: Hematology and Oncology

## 2023-10-16 ENCOUNTER — Other Ambulatory Visit: Payer: Self-pay | Admitting: Hematology and Oncology

## 2023-10-16 DIAGNOSIS — Z170 Estrogen receptor positive status [ER+]: Secondary | ICD-10-CM

## 2023-10-16 MED ORDER — LIDOCAINE-PRILOCAINE 2.5-2.5 % EX CREA: TOPICAL_CREAM | CUTANEOUS | 3 refills | Status: DC

## 2023-10-18 ENCOUNTER — Encounter: Payer: Self-pay | Admitting: Hematology and Oncology

## 2023-10-20 ENCOUNTER — Other Ambulatory Visit: Payer: Self-pay | Admitting: Family Medicine

## 2023-10-21 ENCOUNTER — Encounter: Payer: Self-pay | Admitting: *Deleted

## 2023-10-30 ENCOUNTER — Ambulatory Visit (HOSPITAL_COMMUNITY)
Admission: RE | Admit: 2023-10-30 | Discharge: 2023-10-30 | Disposition: A | Discharge status: Home / Self Care | Payer: Medicare PPO | Source: Ambulatory Visit | Attending: Hematology and Oncology | Admitting: Hematology and Oncology

## 2023-10-30 DIAGNOSIS — Z01818 Encounter for other preprocedural examination: Secondary | ICD-10-CM | POA: Diagnosis not present

## 2023-10-30 DIAGNOSIS — Z170 Estrogen receptor positive status [ER+]: Secondary | ICD-10-CM

## 2023-10-30 DIAGNOSIS — Z0189 Encounter for other specified special examinations: Secondary | ICD-10-CM | POA: Diagnosis not present

## 2023-10-30 DIAGNOSIS — C50212 Malignant neoplasm of upper-inner quadrant of left female breast: Secondary | ICD-10-CM

## 2023-10-30 DIAGNOSIS — I10 Essential (primary) hypertension: Secondary | ICD-10-CM | POA: Insufficient documentation

## 2023-10-30 LAB — ECHOCARDIOGRAM COMPLETE
AR max vel: 2.22 cm2
AV Area VTI: 2.1 cm2
AV Area mean vel: 2.11 cm2
AV Mean grad: 3 mm[Hg]
AV Peak grad: 5.4 mm[Hg]
Ao pk vel: 1.16 m/s
Area-P 1/2: 2.66 cm2
S' Lateral: 2.6 cm

## 2023-11-06 ENCOUNTER — Telehealth: Payer: Self-pay | Admitting: Hematology and Oncology

## 2023-11-06 NOTE — Telephone Encounter (Signed)
Spoke with patient confirming upcoming appointment  

## 2023-11-11 NOTE — Progress Notes (Signed)
Radiation Oncology         [phone] ________________________________  Outpatient Follow Up - Conducted via telephone at patient request.  I spoke with the patient to conduct this visit via telephone. The patient was notified in advance and was offered an in person or telemedicine meeting to allow for face to face communication but instead preferred to proceed with a telephone visit.  Name: Misty Wilkerson        MRN: [id number]  Date of Service: 11/12/2023 DOB: 11/08/1941  XB:MWUXLK, Broadus John, MD  [name], MD     REFERRING PHYSICIAN: [name], MD   DIAGNOSIS: The encounter diagnosis was Malignant neoplasm of upper-outer quadrant of left breast in female, estrogen receptor positive (HCC).   HISTORY OF PRESENT ILLNESS: Misty Wilkerson is a 82 y.o. female with  a diagnosis of breast cancer.  The patient had a history of left stage I, T1N0 ER positive breast cancer in 2000 which was treated with lumpectomy and radiation though she could not tolerate tamoxifen or alternative therapies.  She was found to have new disease in 2014 in the left breast and underwent a left lumpectomy which showed 2 separate lesions and she underwent bracketed lumpectomy on 08/12/2013 which showed grade 1 and grade 2 invasive ductal carcinomas with associated DCIS.  Tumor #2 had a positive superior margin and less than 1 mm for DCIS for tumor #1 and both estrogen and progesterone were positive, HER2 was negative.  She had reexcision of her margins that showed negative tissue without any remaining cancer.  She was treated with antiestrogen therapy for 5 years with letrozole.   Recently however she was found to have a screening detected asymmetry above the area of the prior lumpectomy in the left breast and by further diagnostic ultrasound on 08/19/2023, the lesion in the 3 o'clock position of the breast on the left side persisted measuring 5 mm and her axilla was negative for adenopathy.  She underwent a  biopsy on 08/29/2023 which showed a grade 3 invasive ductal carcinoma that was ER/PR positive, HER2 was positive and her Ki-67 was 30%.  Recommendations were for mastectomy and antiestrogen therapy after her case had been discussed in multidisciplinary breast oncology conference however she is motivated to keep her breast and is seen to consider whether she is a candidate for reirradiation.  While she is opposed to the idea of chemotherapy she is in agreement to subcutaneous Herceptin.   Since her last visit she underwent a left lumpectomy on 09/30/23 which showed a grade 2 invasive ductal carcinoma measuring 1 cm in greatest dimension. Focal intermediate grade DCIS was also noted, and margins for both invasive and in situ disease were negative. She is seen today to revisit the discussion of reirradiation.   PREVIOUS RADIATION THERAPY: Yes   2000: Left Breast was treated in our center, though details about her treatment are not available in the [facility] Everywhere system. She describes 35 fractions to the left breast, treated by Dr. Dayton Scrape.   PAST MEDICAL HISTORY:  Past Medical History:  Diagnosis Date   Arthritis    Cancer (HCC)    left breast   H/O hiatal hernia    eats slowly  now   Hypertension    Personal history of radiation therapy 2002   left breast   Vertigo        PAST SURGICAL HISTORY: Past Surgical History:  Procedure Laterality Date   BREAST BIOPSY Left 08/29/2023   [place] LT BREAST BX W LOC  DEV 1ST LESION IMG BX SPEC US GUIDE 08/29/2023 [facility]   BREAST BIOPSY Left 09/27/2023   [place] LT RADIOACTIVE SEED LOC 09/27/2023 [facility]   BREAST LUMPECTOMY Left 2002, 2014   BREAST LUMPECTOMY WITH RADIOACTIVE SEED LOCALIZATION Left 09/30/2023   Procedure: LEFT BREAST SEED GUIDED LUMPECTOMY;  Surgeon: [name], MD;  Location: [facility];  Service: General;  Laterality: Left;  LMA   CHOLECYSTECTOMY     COLONOSCOPY N/A 10/26/2015   Procedure:  COLONOSCOPY;  Surgeon: Malissa Hippo, MD;  Location: [facility];  Service: Endoscopy;  Laterality: N/A;  1200   DEBRIDEMENT AND CLOSURE WOUND Left 03/25/2018   Procedure: DEBRIDEMENTINTERPHALANGEAL LEFT INDEX FINGER;  Surgeon: Cindee Salt, MD;  Location: [facility];  Service: Orthopedics;  Laterality: Left;   EXCISION MASS UPPER EXTREMETIES Left 03/25/2018   Procedure: EXCISION MASS LEFT INDEX;  Surgeon: Cindee Salt, MD;  Location: [facility];  Service: Orthopedics;  Laterality: Left;   EYE SURGERY     bilateral cataracts   MAXILLARY ANTROSTOMY Left 12/25/2021   Procedure: LEFT MAXILLARY ANTROSTOMY WITH TISSUE REMOVAL;  Surgeon: Newman Pies, MD;  Location: [facility];  Service: ENT;  Laterality: Left;   PARTIAL MASTECTOMY WITH NEEDLE LOCALIZATION AND AXILLARY SENTINEL LYMPH NODE BX Left 08/12/2013   Procedure: LEFT PARTIAL MASTECTOMY WITH NEEDLE LOCALIZATION ATTEMPTED AXILLARY SENTINEL LYMPH NODE BIOPSY;  Surgeon: [name], MD;  Location: [facility];  Service: General;  Laterality: Left;   RE-EXCISION OF BREAST LUMPECTOMY Left 08/27/2013   Procedure: RE-EXCISION OF BREAST LUMPECTOMY;  Surgeon: [name], MD;  Location: [facility];  Service: General;  Laterality: Left;     FAMILY HISTORY:  Family History  Problem Relation Age of Onset   Hypertension Mother    Diabetes Father    Stroke Father    Breast cancer Sister 87   Breast cancer Sister    Aneurysm Sister    Cancer Brother      SOCIAL HISTORY:  reports that she quit smoking about 24 years ago. Her smoking use included cigarettes. She started smoking about 39 years ago. She has a 7.5 pack-year smoking history. She has never used smokeless tobacco. She reports current alcohol use. She reports that she does not use drugs. The patient is married  and lives in [place]. She's accompanied by her husband.  They enjoy surf fishing, camping, and spending time outdoors and with their  family.   ALLERGIES: Penicillins and Tetracyclines & related   MEDICATIONS:  Current Outpatient Medications  Medication Sig Dispense Refill   cholecalciferol (VITAMIN D3) 25 MCG (1000 UNIT) tablet Take 2 tablets (2,000 Units total) by mouth daily.     lidocaine-prilocaine (EMLA) cream Apply to affected area once 30 g 3   Multiple Vitamin (MULTIVITAMIN) tablet Take 1 tablet by mouth daily.     triamterene-hydrochlorothiazide (MAXZIDE-25) 37.5-25 MG tablet Take 0.5 tablets by mouth daily. 45 tablet 3   valsartan (DIOVAN) 160 MG tablet TAKE 1 TABLET BY MOUTH DAILY FOR BLOOD PRESSURE 90 tablet 0   vitamin B-12 (CYANOCOBALAMIN) 1000 MCG tablet Take 1,000 mcg by mouth daily.     No current facility-administered medications for this visit.     REVIEW OF SYSTEMS: On review of systems, she states she's doing well since surgery and has been feeling fine. No other complaints are verbalized.     PHYSICAL EXAM:  Unable to assess due to encounter type.    ECOG = 0  0 -  Asymptomatic (Fully active, able to carry on all predisease activities without restriction)  1 - Symptomatic but completely ambulatory (Restricted in physically strenuous activity but ambulatory and able to carry out work of a light or sedentary nature. For example, light housework, office work)  2 - Symptomatic, <50% in bed during the day (Ambulatory and capable of all self care but unable to carry out any work activities. Up and about more than 50% of waking hours)  3 - Symptomatic, >50% in bed, but not bedbound (Capable of only limited self-care, confined to bed or chair 50% or more of waking hours)  4 - Bedbound (Completely disabled. Cannot carry on any self-care. Totally confined to bed or chair)  5 - Death   Santiago Glad MM, Creech RH, Tormey DC, et al. [phone]). "Toxicity and response criteria of the [facility]". Am. Evlyn Clines. Oncol. 5 (6): 649-55    LABORATORY DATA:  Lab Results  Component Value  Date   WBC 6.0 08/06/2023   HGB 13.6 08/06/2023   HCT 41.8 08/06/2023   MCV 97 08/06/2023   PLT 239 08/06/2023   Lab Results  Component Value Date   NA 135 09/24/2023   K 4.6 09/24/2023   CL 105 09/24/2023   CO2 21 (L) 09/24/2023   Lab Results  Component Value Date   ALT 20 08/06/2023   AST 23 08/06/2023   ALKPHOS 70 08/06/2023   BILITOT 0.5 08/06/2023      RADIOGRAPHY: ECHOCARDIOGRAM COMPLETE Result Date: 10/30/2023    ECHOCARDIOGRAM REPORT   Patient Name:   [facility] Date of Exam: 10/30/2023 Medical Rec #:  [id number]         Height:       61.0 in Accession #:    [id number]        Weight:       162.1 lb Date of Birth:  November 04, 1941        BSA:          1.727 m Patient Age:    82 years          BP:           150/54 mmHg Patient Gender: F                 HR:           70 bpm. Exam Location:  Outpatient Procedure: 2D Echo, 3D Echo, Cardiac Doppler, Color Doppler and Strain Analysis Indications:    Chemo  History:        Patient has no prior history of Echocardiogram examinations.                 Risk Factors:Hypertension.  Sonographer:    Darlys Gales Referring Phys: [id number] PRAVEENA IRUKU  Sonographer Comments: Global longitudinal strain was attempted. IMPRESSIONS  1. Left ventricular ejection fraction, by estimation, is 60 to 65%. Left ventricular ejection fraction by 3D volume is 64 %. The left ventricle has normal function. The left ventricle has no regional wall motion abnormalities. There is mild left ventricular hypertrophy. Left ventricular diastolic parameters are indeterminate.  2. Right ventricular systolic function is normal. The right ventricular size is normal. There is normal pulmonary artery systolic pressure. The estimated right ventricular systolic pressure is 17.4 mmHg.  3. The mitral valve is normal in structure. Trivial mitral valve regurgitation. No evidence of mitral stenosis.  4. The aortic valve was not well visualized. Aortic valve regurgitation is not  visualized. No aortic stenosis is present.  5. The inferior vena cava is normal in size with greater than 50% respiratory variability, suggesting right atrial pressure of 3 mmHg. FINDINGS  Left Ventricle: Left ventricular ejection fraction, by estimation, is 60 to 65%. Left ventricular ejection fraction by 3D volume is 64 %. The left ventricle has normal function. The left ventricle has no regional wall motion abnormalities. The left ventricular internal cavity size was normal in size. There is mild left ventricular hypertrophy. Left ventricular diastolic parameters are indeterminate. Right Ventricle: The right ventricular size is normal. No increase in right ventricular wall thickness. Right ventricular systolic function is normal. There is normal pulmonary artery systolic pressure. The tricuspid regurgitant velocity is 1.90 m/s, and  with an assumed right atrial pressure of 3 mmHg, the estimated right ventricular systolic pressure is 17.4 mmHg. Left Atrium: Left atrial size was normal in size. Right Atrium: Right atrial size was normal in size. Pericardium: There is no evidence of pericardial effusion. Mitral Valve: The mitral valve is normal in structure. Trivial mitral valve regurgitation. No evidence of mitral valve stenosis. Tricuspid Valve: The tricuspid valve is normal in structure. Tricuspid valve regurgitation is trivial. Aortic Valve: The aortic valve was not well visualized. Aortic valve regurgitation is not visualized. No aortic stenosis is present. Aortic valve mean gradient measures 3.0 mmHg. Aortic valve peak gradient measures 5.4 mmHg. Aortic valve area, by VTI measures 2.10 cm. Pulmonic Valve: The pulmonic valve was not well visualized. Pulmonic valve regurgitation is trivial. Aorta: The aortic root and ascending aorta are structurally normal, with no evidence of dilitation. Venous: The inferior vena cava is normal in size with greater than 50% respiratory variability, suggesting right atrial  pressure of 3 mmHg. IAS/Shunts: The interatrial septum was not well visualized.  LEFT VENTRICLE PLAX 2D LVIDd:         3.90 cm         Diastology LVIDs:         2.60 cm         LV e' medial:    5.33 cm/s LV PW:         1.10 cm         LV E/e' medial:  21.6 LV IVS:        1.10 cm         LV e' lateral:   5.44 cm/s LVOT diam:     1.80 cm         LV E/e' lateral: 21.1 LV SV:         65 LV SV Index:   38 LVOT Area:     2.54 cm        3D Volume EF                                LV 3D EF:    Left                                             ventricul                                             ar  ejection                                             fraction                                             by 3D                                             volume is                                             64 %.                                 3D Volume EF:                                3D EF:        64 %                                LV EDV:       101 ml                                LV ESV:       36 ml                                LV SV:        65 ml RIGHT VENTRICLE RV S prime:     12.60 cm/s TAPSE (M-mode): 3.1 cm LEFT ATRIUM             Index        RIGHT ATRIUM           Index LA Vol (A2C):   37.6 ml 21.77 ml/m  RA Area:     12.10 cm LA Vol (A4C):   56.1 ml 32.48 ml/m  RA Volume:   26.20 ml  15.17 ml/m LA Biplane Vol: 48.3 ml 27.96 ml/m  AORTIC VALVE AV Area (Vmax):    2.22 cm AV Area (Vmean):   2.11 cm AV Area (VTI):     2.10 cm AV Vmax:           116.00 cm/s AV Vmean:          87.800 cm/s AV VTI:            0.312 m AV Peak Grad:      5.4 mmHg AV Mean Grad:      3.0 mmHg LVOT Vmax:         101.00 cm/s LVOT Vmean:        72.700 cm/s LVOT VTI:  0.257 m LVOT/AV VTI ratio: 0.82  AORTA Ao Root diam: 2.80 cm Ao Asc diam:  2.80 cm MITRAL VALVE                TRICUSPID VALVE MV Area (PHT): 2.66 cm     TR Peak grad:   14.4 mmHg MV Decel Time: 285 msec     TR Vmax:         190.00 cm/s MV E velocity: 115.00 cm/s MV A velocity: 117.00 cm/s  SHUNTS MV E/A ratio:  0.98         Systemic VTI:  0.26 m                             Systemic Diam: 1.80 cm Epifanio Lesches MD Electronically signed by Epifanio Lesches MD Signature Date/Time: 10/30/2023/1:01:23 PM    Final        IMPRESSION/PLAN: 1. Stage IA, pT1b, cN0M0, grade 2 triple positive invasive ductal carcinoma of the left breast in the setting of ER/ PR positive breast left cancer in 2014 as well as ER positive cancer in 2000 treated with lumpectomy and radiation. Dr. Mitzi Hansen has reviewed the patient's final pathology findings and today, she and I reviewed the nature of delayed recurrences of breast cancer.  We reviewed the risks, benefits, short, and long term effects of re-irradiation, as well as the curative intent, and the patient is interested in proceeding. I reviewed the delivery and logistics of radiotherapy and again Dr. Mitzi Hansen recommends 4 weeks of radiotherapy to the left breast with deep inspiration breath hold technique. She will come for simulation today at which time she will sign written consent to proceed.       This encounter was conducted via telephone.  The patient has provided two factor identification and has given verbal consent for this type of encounter and has been advised to only accept a meeting of this type in a secure network environment. The time spent during this encounter was 35 minutes including preparation, discussion, and coordination of the patient's care. The attendants for this meeting include Ronny Bacon  and Samyiah Earles and her husband Misty Wilkerson.  During the encounter, Ronny Bacon was located at [facility] Radiation Oncology Department.  Misty Wilkerson was located in her car with her husband Misty Wilkerson.      Osker Mason, [facility]    **Disclaimer: This note was dictated with voice recognition software.  Similar sounding words can inadvertently be transcribed and this note may contain transcription errors which may not have been corrected upon publication of note.**

## 2023-11-12 ENCOUNTER — Encounter: Payer: Self-pay | Admitting: Radiation Oncology

## 2023-11-12 ENCOUNTER — Ambulatory Visit
Admission: RE | Admit: 2023-11-12 | Discharge: 2023-11-12 | Disposition: A | Discharge status: Home / Self Care | Payer: Medicare PPO | Source: Ambulatory Visit | Attending: Internal Medicine | Admitting: Internal Medicine

## 2023-11-12 ENCOUNTER — Ambulatory Visit
Admission: RE | Admit: 2023-11-12 | Discharge: 2023-11-12 | Disposition: A | Discharge status: Home / Self Care | Payer: Medicare PPO | Source: Ambulatory Visit | Attending: Radiation Oncology | Admitting: Radiation Oncology

## 2023-11-12 VITALS — Ht 61.0 in | Wt 160.0 lb

## 2023-11-12 DIAGNOSIS — C50412 Malignant neoplasm of upper-outer quadrant of left female breast: Secondary | ICD-10-CM | POA: Insufficient documentation

## 2023-11-12 DIAGNOSIS — Z170 Estrogen receptor positive status [ER+]: Secondary | ICD-10-CM | POA: Diagnosis not present

## 2023-11-12 NOTE — Progress Notes (Signed)
Nursing interview for Malignant neoplasm of upper-outer quadrant of left breast in female, estrogen receptor positive (HCC).   Patient identity verified x2.  Patient reports doing well. Patient denies any related issues at this time.  Meaningful use complete.  Vitals- None (Telephone apt) Ht 5\' 1"  (1.549 m)   Wt 160 lb (72.6 kg)   BMI 30.23 kg/m    This concludes the interaction.  Ruel Favors, LPN

## 2023-11-21 ENCOUNTER — Ambulatory Visit: Payer: Medicare PPO | Admitting: Radiation Oncology

## 2023-11-22 ENCOUNTER — Other Ambulatory Visit: Payer: Self-pay | Admitting: *Deleted

## 2023-11-22 ENCOUNTER — Ambulatory Visit: Payer: Medicare PPO

## 2023-11-22 DIAGNOSIS — Z170 Estrogen receptor positive status [ER+]: Secondary | ICD-10-CM | POA: Diagnosis not present

## 2023-11-22 DIAGNOSIS — C50412 Malignant neoplasm of upper-outer quadrant of left female breast: Secondary | ICD-10-CM | POA: Diagnosis not present

## 2023-11-25 ENCOUNTER — Encounter: Payer: Self-pay | Admitting: Hematology and Oncology

## 2023-11-25 ENCOUNTER — Inpatient Hospital Stay: Payer: Medicare PPO | Attending: Hematology and Oncology | Admitting: Hematology and Oncology

## 2023-11-25 ENCOUNTER — Inpatient Hospital Stay: Payer: Medicare PPO

## 2023-11-25 ENCOUNTER — Encounter: Payer: Self-pay | Admitting: *Deleted

## 2023-11-25 ENCOUNTER — Ambulatory Visit: Payer: Medicare PPO

## 2023-11-25 ENCOUNTER — Other Ambulatory Visit: Payer: Self-pay | Admitting: *Deleted

## 2023-11-25 VITALS — BP 136/56 | HR 84 | Temp 97.7°F | Resp 16 | Wt 164.3 lb

## 2023-11-25 DIAGNOSIS — Z170 Estrogen receptor positive status [ER+]: Secondary | ICD-10-CM

## 2023-11-25 DIAGNOSIS — C50412 Malignant neoplasm of upper-outer quadrant of left female breast: Secondary | ICD-10-CM | POA: Diagnosis not present

## 2023-11-25 DIAGNOSIS — Z5112 Encounter for antineoplastic immunotherapy: Secondary | ICD-10-CM | POA: Diagnosis not present

## 2023-11-25 DIAGNOSIS — C50212 Malignant neoplasm of upper-inner quadrant of left female breast: Secondary | ICD-10-CM | POA: Diagnosis not present

## 2023-11-25 MED ORDER — TRASTUZUMAB-ANNS CHEMO 150 MG IV SOLR
8.0000 mg/kg | Freq: Once | INTRAVENOUS | Status: AC
  Administered 2023-11-25: 600 mg via INTRAVENOUS
  Filled 2023-11-25: qty 28.57

## 2023-11-25 MED ORDER — ACETAMINOPHEN 325 MG PO TABS
650.0000 mg | ORAL_TABLET | Freq: Once | ORAL | Status: AC
  Administered 2023-11-25: 650 mg via ORAL
  Filled 2023-11-25: qty 2

## 2023-11-25 MED ORDER — SODIUM CHLORIDE 0.9 % IV SOLN: INTRAVENOUS | Status: DC

## 2023-11-25 MED ORDER — HEPARIN SOD (PORK) LOCK FLUSH 100 UNIT/ML IV SOLN: 500.0000 [IU] | Freq: Once | INTRAVENOUS | Status: DC | PRN

## 2023-11-25 MED ORDER — SODIUM CHLORIDE 0.9% FLUSH: 10.0000 mL | INTRAVENOUS | Status: DC | PRN

## 2023-11-25 MED ORDER — DIPHENHYDRAMINE HCL 25 MG PO CAPS
50.0000 mg | ORAL_CAPSULE | Freq: Once | ORAL | Status: AC
  Administered 2023-11-25: 50 mg via ORAL
  Filled 2023-11-25: qty 2

## 2023-11-25 NOTE — Assessment & Plan Note (Signed)
This is a 82 year old female patient with past medical history significant for left breast cancer in 2000, 2014 now presents with new onset left breast invasive ductal carcinoma, grade 3, ER/PR and HER2 amplified.  Patient has followed up with Dr. Dwain Sarna and the recommendation was to consider mastectomy given the HER2 amplified breast cancer but patient is strongly opposed to it.  We have today reviewed about consideration for lumpectomy with reirradiation and referral to radiation oncology locally since she is not willing to consider mastectomy especially because she is worried about her quality of life.  She is now status post lumpectomy, final pathology showed grade 2 IDC measuring 1 x 0.8 x 0.5 cm, negative margins, no sentinel lymph nodes submitted. She did not want to consider adjuvant chemotherapy hence have discussed about subcutaneous/IV Herceptin every 3 weeks for a whole year along with antiestrogen therapy once she completes radiation.  Herceptin to continue concurrently with radiation.  She is agreeable to this plan.  She is understandably nervous about the possible side effects from Herceptin.  Baseline echo with no major concerns.  I will see her again in 3 weeks.  She is agreeable to trying the Femara after completing radiation.  Total time: 30 min

## 2023-11-25 NOTE — Patient Instructions (Signed)
[facility] - A DEPT OF [facility]  Discharge Instructions: Thank you for choosing [facility] to provide your oncology and hematology care.   If you have a lab appointment with the Cancer Center, please go directly to the Cancer Center and check in at the registration area.   Wear comfortable clothing and clothing appropriate for easy access to any Portacath or PICC line.   We strive to give you quality time with your provider. You may need to reschedule your appointment if you arrive late (15 or more minutes).  Arriving late affects you and other patients whose appointments are after yours.  Also, if you miss three or more appointments without notifying the office, you may be dismissed from the clinic at the provider's discretion.      For prescription refill requests, have your pharmacy contact our office and allow 72 hours for refills to be completed.    Today you received the following chemotherapy and/or immunotherapy agents: Kanjinti.      To help prevent nausea and vomiting after your treatment, we encourage you to take your nausea medication as directed.  BELOW ARE SYMPTOMS THAT SHOULD BE REPORTED IMMEDIATELY: *FEVER GREATER THAN 100.4 F (38 C) OR HIGHER *CHILLS OR SWEATING *NAUSEA AND VOMITING THAT IS NOT CONTROLLED WITH YOUR NAUSEA MEDICATION *UNUSUAL SHORTNESS OF BREATH *UNUSUAL BRUISING OR BLEEDING *URINARY PROBLEMS (pain or burning when urinating, or frequent urination) *BOWEL PROBLEMS (unusual diarrhea, constipation, pain near the anus) TENDERNESS IN MOUTH AND THROAT WITH OR WITHOUT PRESENCE OF ULCERS (sore throat, sores in mouth, or a toothache) UNUSUAL RASH, SWELLING OR PAIN  UNUSUAL VAGINAL DISCHARGE OR ITCHING   Items with * indicate a potential emergency and should be followed up as soon as possible or go to the Emergency Department if any problems should occur.  Please show the CHEMOTHERAPY ALERT CARD or IMMUNOTHERAPY  ALERT CARD at check-in to the Emergency Department and triage nurse.  Should you have questions after your visit or need to cancel or reschedule your appointment, please contact [facility] - [facility] DEPT [facility]  Dept: [phone]  and follow the prompts.  Office hours are 8:00 a.m. to 4:30 p.m. Monday - Friday. Please note that voicemails left after 4:00 p.m. may not be returned until the following business day.  We are closed weekends and major holidays. You have access to a nurse at all times for urgent questions. Please call the main number to the clinic Dept: [phone] and follow the prompts.   For any non-urgent questions, you may also contact your provider using MyChart. We now offer e-Visits for anyone 35 and older to request care online for non-urgent symptoms. For details visit [email or web address].   Also download the MyChart app! Go to the app store, search "MyChart", open the app, select , and log in with your MyChart username and password.  Trastuzumab Injection What is this medication? TRASTUZUMAB (tras TOO zoo mab) treats breast cancer and stomach cancer. It works by blocking a protein that causes cancer cells to grow and multiply. This helps to slow or stop the spread of cancer cells. This medicine may be used for other purposes; ask your health care provider or pharmacist if you have questions. COMMON BRAND NAME(S): Herceptin, Marlowe Alt, Ontruzant, Trazimera What should I tell my care team before I take this medication? They need to know if you have any of these  conditions: Heart failure Lung disease An unusual or allergic reaction to trastuzumab, other medications, foods, dyes, or preservatives Pregnant or trying to get pregnant Breast-feeding How should I use this medication? This medication is injected into a vein. It is given by your care team in a hospital or clinic setting. Talk to your care team about  the use of this medication in children. It is not approved for use in children. Overdosage: If you think you have taken too much of this medicine contact a poison control center or emergency room at once. NOTE: This medicine is only for you. Do not share this medicine with others. What if I miss a dose? Keep appointments for follow-up doses. It is important not to miss your dose. Call your care team if you are unable to keep an appointment. What may interact with this medication? Certain types of chemotherapy, such as daunorubicin, doxorubicin, epirubicin, idarubicin This list may not describe all possible interactions. Give your health care provider a list of all the medicines, herbs, non-prescription drugs, or dietary supplements you use. Also tell them if you smoke, drink alcohol, or use illegal drugs. Some items may interact with your medicine. What should I watch for while using this medication? Your condition will be monitored carefully while you are receiving this medication. This medication may make you feel generally unwell. This is not uncommon, as chemotherapy affects healthy cells as well as cancer cells. Report any side effects. Continue your course of treatment even though you feel ill unless your care team tells you to stop. This medication may increase your risk of getting an infection. Call your care team for advice if you get a fever, chills, sore throat, or other symptoms of a cold or flu. Do not treat yourself. Try to avoid being around people who are sick. Avoid taking medications that contain aspirin, acetaminophen, ibuprofen, naproxen, or ketoprofen unless instructed by your care team. These medications can hide a fever. Talk to your care team if you may be pregnant. Serious birth defects can occur if you take this medication during pregnancy and for 7 months after the last dose. You will need a negative pregnancy test before starting this medication. Contraception is recommended  while taking this medication and for 7 months after the last dose. Your care team can help you find the option that works for you. Do not breastfeed while taking this medication and for 7 months after stopping treatment. What side effects may I notice from receiving this medication? Side effects that you should report to your care team as soon as possible: Allergic reactions or angioedema--skin rash, itching or hives, swelling of the face, eyes, lips, tongue, arms, or legs, trouble swallowing or breathing Dry cough, shortness of breath or trouble breathing Heart failure--shortness of breath, swelling of the ankles, feet, or hands, sudden weight gain, unusual weakness or fatigue Infection--fever, chills, cough, or sore throat Infusion reactions--chest pain, shortness of breath or trouble breathing, feeling faint or lightheaded Side effects that usually do not require medical attention (report to your care team if they continue or are bothersome): Diarrhea Dizziness Headache Nausea Trouble sleeping Vomiting This list may not describe all possible side effects. Call your doctor for medical advice about side effects. You may report side effects to FDA at 1-800-FDA-1088. Where should I keep my medication? This medication is given in a hospital or clinic. It will not be stored at home. NOTE: This sheet is a summary. It may not cover all possible information. If  you have questions about this medicine, talk to your doctor, pharmacist, or health care provider.  2024 Elsevier/Gold Standard (2022-03-27 00:00:00)

## 2023-11-25 NOTE — Progress Notes (Signed)
[facility] CONSULT NOTE  Patient Care Team: [name], MD as PCP - General (Family Medicine) Pershing Proud, RN as Oncology Nurse Navigator Donnelly Angelica, RN as Oncology Nurse Navigator Serena Croissant, MD as Consulting Physician (Hematology and Oncology)  CHIEF COMPLAINTS/PURPOSE OF CONSULTATION:  Newly diagnosed breast cancer  HISTORY OF PRESENTING ILLNESS:  Misty Wilkerson 82 y.o. female is here because of recent diagnosis of left breast cancer  I reviewed her records extensively and collaborated the history with the patient.  SUMMARY OF ONCOLOGIC HISTORY: Oncology History  Breast cancer of upper-inner quadrant of left female breast (HCC)  12/08/1998 Initial Biopsy   Left breast cancer: Lumpectomy followed by radiation T1 BN 0M0 could not tolerate tamoxifen or alternative therapies.   08/12/2013 Surgery   Left breast lumpectomy: Tumor 1  invasive ductal carcinoma 0.4 cm with DCIS, tumor to: IDC 1.2 cm with DCIS positive margin, ER 98%, PR 14%, HER-2 negative, Ki-67 19% margins reexcision negative   09/07/2013 -  Anti-estrogen oral therapy   Letrozole 2.5 mg daily plans for 5 years   Breast cancer of upper-outer quadrant of left female breast (HCC)  09/10/2023 Initial Diagnosis   Breast cancer of upper-outer quadrant of left female breast (HCC)   09/10/2023 Cancer Staging   Staging form: Breast, AJCC 8th Edition - Clinical: Stage IA (cT1, cN0, cM0, G3, ER+, PR+, HER2+) - Signed by Rachel Moulds, MD on 09/10/2023 Stage prefix: Initial diagnosis Histologic grading system: 3 grade system   11/25/2023 -  Chemotherapy   Patient is on Treatment Plan : BREAST Trastuzumab IV (8/6) or SQ (600) D1 q21d     11/26/2023 - 11/26/2023 Chemotherapy   Patient is on Treatment Plan : BREAST Paclitaxel + Trastuzumab q7d / Trastuzumab q21d     11/26/2023 - 11/26/2023 Chemotherapy   Patient is on Treatment Plan : BREAST Trastuzumab SQ (600) D1 q21d      The  patient, an 82 year old woman with a history of breast cancer, presents with a new breast mass. This is her third occurrence of breast cancer, with previous diagnoses in 2000 and 2014.  Since her last visit here she had left breast lumpectomy, final pathology showed invasive ductal carcinoma measuring 1 x 0.8 x 0.5 cm, grade 2, DCIS focal solid type grade 2, negative margins for both invasive and DCIS.  All margins are negative for malignancy.  No lymph node specimen was submitted.  Prognostics from previous breast biopsy showed ER +100% strong PR +100% strong and HER2 positive by FISH with Ki-67 of 30%.   Discussed the use of AI scribe software for clinical note transcription with the patient, who gave verbal consent to proceed.  History of Present Illness    The patient, with a history of breast cancer, is scheduled to start radiation and Herceptin. She expresses nervousness about the upcoming treatment, particularly the Herceptin, which will be administered either as an injection or an IV infusion, depending on insurance authorization.  The patient also mentions a planned beach trip after the completion of radiation on January 29th. She is a vegetarian and expresses concern about dietary restrictions with Herceptin, but is reassured that she can eat whatever she wants.  The patient also has a strong heart, as confirmed by a recent echocardiogram.   MEDICAL HISTORY:  Past Medical History:  Diagnosis Date   Arthritis    Cancer (HCC)    left breast   H/O hiatal hernia    eats slowly  now  Hypertension    Personal history of radiation therapy 2002   left breast   Vertigo     SURGICAL HISTORY: Past Surgical History:  Procedure Laterality Date   BREAST BIOPSY Left 08/29/2023   [place] LT BREAST BX W LOC DEV 1ST LESION IMG BX SPEC US GUIDE 08/29/2023 [facility]   BREAST BIOPSY Left 09/27/2023   [place] LT RADIOACTIVE SEED LOC 09/27/2023 [facility]   BREAST LUMPECTOMY Left 2002, 2014    BREAST LUMPECTOMY WITH RADIOACTIVE SEED LOCALIZATION Left 09/30/2023   Procedure: LEFT BREAST SEED GUIDED LUMPECTOMY;  Surgeon: [name], MD;  Location: [facility];  Service: General;  Laterality: Left;  LMA   CHOLECYSTECTOMY     COLONOSCOPY N/A 10/26/2015   Procedure: COLONOSCOPY;  Surgeon: Malissa Hippo, MD;  [facility];  Service: Endoscopy;  Laterality: N/A;  1200   DEBRIDEMENT AND CLOSURE WOUND Left 03/25/2018   Procedure: DEBRIDEMENTINTERPHALANGEAL LEFT INDEX FINGER;  Surgeon: Cindee Salt, MD;  Location: [facility];  Service: Orthopedics;  Laterality: Left;   EXCISION MASS UPPER EXTREMETIES Left 03/25/2018   Procedure: EXCISION MASS LEFT INDEX;  Surgeon: Cindee Salt, MD;  Location: [facility];  Service: Orthopedics;  Laterality: Left;   EYE SURGERY     bilateral cataracts   MAXILLARY ANTROSTOMY Left 12/25/2021   Procedure: LEFT MAXILLARY ANTROSTOMY WITH TISSUE REMOVAL;  Surgeon: Newman Pies, MD;  Location:  SURGERY CENTER;  Service: ENT;  Laterality: Left;   PARTIAL MASTECTOMY WITH NEEDLE LOCALIZATION AND AXILLARY SENTINEL LYMPH NODE BX Left 08/12/2013   Procedure: LEFT PARTIAL MASTECTOMY WITH NEEDLE LOCALIZATION ATTEMPTED AXILLARY SENTINEL LYMPH NODE BIOPSY;  Surgeon: [name], MD;  Location: [facility];  Service: General;  Laterality: Left;   RE-EXCISION OF BREAST LUMPECTOMY Left 08/27/2013   Procedure: RE-EXCISION OF BREAST LUMPECTOMY;  Surgeon: [name], MD;  Location: [facility];  Service: General;  Laterality: Left;    SOCIAL HISTORY: Social History   Socioeconomic History   Marital status: Married    Spouse name: Kathlene November   Number of children: 2   Years of education: Not on file   Highest education level: Bachelor's degree (e.g., BA, AB, BS)  Occupational History   Occupation: retired  Tobacco Use   Smoking status: Former    Current packs/day: 0.00    Average packs/day: 0.5 packs/day for 15.0  years (7.5 ttl pk-yrs)    Types: Cigarettes    Start date: 07/21/1984    Quit date: 07/22/1999    Years since quitting: 24.3   Smokeless tobacco: Never  Vaping Use   Vaping status: Never Used  Substance and Sexual Activity   Alcohol use: Yes    Comment: one alcohol drink daily   Drug use: No   Sexual activity: Yes  Other Topics Concern   Not on file  Social History Narrative   Very independent and pretty active   Social Drivers of Corporate investment banker Strain: Low Risk  (02/15/2023)   Overall Financial Resource Strain (CARDIA)    Difficulty of Paying Living Expenses: Not hard at all  Food Insecurity: No Food Insecurity (09/24/2023)   Hunger Vital Sign    Worried About Running Out of Food in the Last Year: Never true    Ran Out of Food in the Last Year: Never true  Transportation Needs: No Transportation Needs (09/24/2023)   PRAPARE - Administrator, Civil Service (Medical): No    Lack of Transportation (Non-Medical): No  Physical Activity: Sufficiently Active (02/15/2023)   Exercise Vital Sign    Days of Exercise per Week: 5 days    Minutes of Exercise per Session: 30 min  Recent Concern: Physical Activity - Insufficiently Active (12/07/2022)   Exercise Vital Sign    Days of Exercise per Week: 3 days    Minutes of Exercise per Session: 30 min  Stress: No Stress Concern Present (02/15/2023)   [employer] of Occupational Health - Occupational Stress Questionnaire    Feeling of Stress : Only a little  Social Connections: Socially Integrated (02/15/2023)   Social Connection and Isolation Panel [NHANES]    Frequency of Communication with Friends and Family: More than three times a week    Frequency of Social Gatherings with Friends and Family: Once a week    Attends Religious Services: More than 4 times per year    Active Member of [employer] or Organizations: Yes    Attends Engineer, structural: More than 4 times per year    Marital Status: Married   Catering manager Violence: Not At Risk (09/24/2023)   Humiliation, Afraid, Rape, and Kick questionnaire    Fear of Current or Ex-Partner: No    Emotionally Abused: No    Physically Abused: No    Sexually Abused: No    FAMILY HISTORY: Family History  Problem Relation Age of Onset   Hypertension Mother    Diabetes Father    Stroke Father    Breast cancer Sister 7   Breast cancer Sister    Aneurysm Sister    Cancer Brother     ALLERGIES:  is allergic to penicillins and tetracyclines & related.  MEDICATIONS:  Current Outpatient Medications  Medication Sig Dispense Refill   cholecalciferol (VITAMIN D3) 25 MCG (1000 UNIT) tablet Take 2 tablets (2,000 Units total) by mouth daily.     lidocaine-prilocaine (EMLA) cream Apply to affected area once 30 g 3   Multiple Vitamin (MULTIVITAMIN) tablet Take 1 tablet by mouth daily.     triamterene-hydrochlorothiazide (MAXZIDE-25) 37.5-25 MG tablet Take 0.5 tablets by mouth daily. 45 tablet 3   valsartan (DIOVAN) 160 MG tablet TAKE 1 TABLET BY MOUTH DAILY FOR BLOOD PRESSURE 90 tablet 0   vitamin B-12 (CYANOCOBALAMIN) 1000 MCG tablet Take 1,000 mcg by mouth daily.     No current facility-administered medications for this visit.   Facility-Administered Medications Ordered in Other Visits  Medication Dose Route Frequency Provider Last Rate Last Admin   0.9 %  sodium chloride infusion   Intravenous Continuous [place] Mielke, Burnice Logan, MD 10 mL/hr at 11/25/23 1250 New Bag at 11/25/23 1250   acetaminophen (TYLENOL) tablet 650 mg  650 mg Oral Once Sheyann Sulton, Burnice Logan, MD       diphenhydrAMINE (BENADRYL) capsule 50 mg  50 mg Oral Once Sarinah Doetsch, Burnice Logan, MD       heparin lock flush 100 unit/mL  500 Units Intracatheter Once PRN Takeya Marquis, MD       sodium chloride flush (NS) 0.9 % injection 10 mL  10 mL Intracatheter PRN Suhail Peloquin, MD       trastuzumab-anns (KANJINTI) 600 mg in sodium chloride 0.9 % 250 mL chemo infusion  8 mg/kg (Treatment Plan Recorded)  Intravenous Once Jessie Cowher, Burnice Logan, MD         PHYSICAL EXAMINATION: ECOG PERFORMANCE STATUS: 0 - Asymptomatic  Vitals:   11/25/23 1057  BP: (!) 136/56  Pulse: 84  Resp: 16  Temp: 97.7 F (36.5 C)  SpO2: 100%   Filed Weights  11/25/23 1057  Weight: 164 lb 4.8 oz (74.5 kg)    GENERAL:alert, no distress and comfortable Chest: Clear to auscultation bilaterally Heart: Rate and rhythm regular Abdomen: Soft, nontender, nondistended No lower extremity edema  LABORATORY DATA:  I have reviewed the data as listed Lab Results  Component Value Date   WBC 6.0 08/06/2023   HGB 13.6 08/06/2023   HCT 41.8 08/06/2023   MCV 97 08/06/2023   PLT 239 08/06/2023   Lab Results  Component Value Date   NA 135 09/24/2023   K 4.6 09/24/2023   CL 105 09/24/2023   CO2 21 (L) 09/24/2023    RADIOGRAPHIC STUDIES: I have personally reviewed the radiological reports and agreed with the findings in the report.  ASSESSMENT AND PLAN:  Breast cancer of upper-inner quadrant of left female breast This is a 82 year old female patient with past medical history significant for left breast cancer in 2000, 2014 now presents with new onset left breast invasive ductal carcinoma, grade 3, ER/PR and HER2 amplified.  Patient has followed up with Dr. Dwain Sarna and the recommendation was to consider mastectomy given the HER2 amplified breast cancer but patient is strongly opposed to it.  We have today reviewed about consideration for lumpectomy with reirradiation and referral to radiation oncology locally since she is not willing to consider mastectomy especially because she is worried about her quality of life.  She is now status post lumpectomy, final pathology showed grade 2 IDC measuring 1 x 0.8 x 0.5 cm, negative margins, no sentinel lymph nodes submitted. She did not want to consider adjuvant chemotherapy hence have discussed about subcutaneous/IV Herceptin every 3 weeks for a whole year along with antiestrogen  therapy once she completes radiation.  Herceptin to continue concurrently with radiation.  She is agreeable to this plan.  She is understandably nervous about the possible side effects from Herceptin.  Baseline echo with no major concerns.  I will see her again in 3 weeks.  She is agreeable to trying the Femara after completing radiation.  Total time: 30 min     All questions were answered. The patient knows to call the clinic with any problems, questions or concerns.    Rachel Moulds, MD 11/25/23

## 2023-11-26 ENCOUNTER — Telehealth: Payer: Self-pay

## 2023-11-26 ENCOUNTER — Ambulatory Visit: Payer: Medicare PPO

## 2023-11-26 NOTE — Telephone Encounter (Signed)
-----   Message from Nurse Threasa Beards sent at 11/25/2023  5:19 PM EST ----- Regarding: Dr Al Pimple pt, first time Kanjinti. Dr Al Pimple pt came in 12/30 for first time Kanjinti. Tolerated infusion well. Needs call back.

## 2023-11-26 NOTE — Telephone Encounter (Signed)
 Misty Wilkerson states that she is doing fine.She did send a Mychart message at 1939 11-25-23 that she was having chills and was answered this am by Dr. Walter nurse Tammi.  No more chills or itching this afternoon.  She is eating, drinking, and urinating well. She knows to call the office at [phone] if  she has any questions or concerns.

## 2023-11-28 ENCOUNTER — Ambulatory Visit: Payer: Medicare PPO

## 2023-11-28 ENCOUNTER — Other Ambulatory Visit: Payer: Self-pay

## 2023-11-28 ENCOUNTER — Ambulatory Visit
Admission: RE | Admit: 2023-11-28 | Discharge: 2023-11-28 | Disposition: A | Discharge status: Home / Self Care | Payer: Medicare PPO | Source: Ambulatory Visit | Attending: Radiation Oncology | Admitting: Radiation Oncology

## 2023-11-28 DIAGNOSIS — C50412 Malignant neoplasm of upper-outer quadrant of left female breast: Secondary | ICD-10-CM | POA: Insufficient documentation

## 2023-11-28 DIAGNOSIS — Z170 Estrogen receptor positive status [ER+]: Secondary | ICD-10-CM | POA: Insufficient documentation

## 2023-11-28 DIAGNOSIS — C50212 Malignant neoplasm of upper-inner quadrant of left female breast: Secondary | ICD-10-CM | POA: Diagnosis not present

## 2023-11-28 DIAGNOSIS — Z510 Encounter for antineoplastic radiation therapy: Secondary | ICD-10-CM | POA: Diagnosis not present

## 2023-11-28 LAB — RAD ONC ARIA SESSION SUMMARY
Course Elapsed Days: 0
Plan Fractions Treated to Date: 1
Plan Prescribed Dose Per Fraction: 2.66 Gy
Plan Total Fractions Prescribed: 16
Plan Total Prescribed Dose: 42.56 Gy
Reference Point Dosage Given to Date: 2.66 Gy
Reference Point Session Dosage Given: 2.66 Gy
Session Number: 1

## 2023-11-29 ENCOUNTER — Ambulatory Visit: Payer: Medicare PPO

## 2023-11-29 ENCOUNTER — Other Ambulatory Visit: Payer: Self-pay

## 2023-11-29 ENCOUNTER — Ambulatory Visit
Admission: RE | Admit: 2023-11-29 | Discharge: 2023-11-29 | Disposition: A | Discharge status: Home / Self Care | Payer: Medicare PPO | Source: Ambulatory Visit | Attending: Radiation Oncology

## 2023-11-29 DIAGNOSIS — C50212 Malignant neoplasm of upper-inner quadrant of left female breast: Secondary | ICD-10-CM

## 2023-11-29 DIAGNOSIS — Z510 Encounter for antineoplastic radiation therapy: Secondary | ICD-10-CM | POA: Diagnosis not present

## 2023-11-29 DIAGNOSIS — Z170 Estrogen receptor positive status [ER+]: Secondary | ICD-10-CM | POA: Diagnosis not present

## 2023-11-29 DIAGNOSIS — C50412 Malignant neoplasm of upper-outer quadrant of left female breast: Secondary | ICD-10-CM | POA: Diagnosis not present

## 2023-11-29 LAB — RAD ONC ARIA SESSION SUMMARY
Course Elapsed Days: 1
Plan Fractions Treated to Date: 2
Plan Prescribed Dose Per Fraction: 2.66 Gy
Plan Total Fractions Prescribed: 16
Plan Total Prescribed Dose: 42.56 Gy
Reference Point Dosage Given to Date: 5.32 Gy
Reference Point Session Dosage Given: 2.66 Gy
Session Number: 2

## 2023-11-29 MED ORDER — RADIAPLEXRX EX GEL
Freq: Once | CUTANEOUS | Status: AC
Start: 2023-11-29 — End: 2023-11-29

## 2023-11-29 MED ORDER — ALRA NON-METALLIC DEODORANT (RAD-ONC)
1.0000 | Freq: Once | TOPICAL | Status: AC
  Administered 2023-11-29: 1 via TOPICAL

## 2023-12-02 ENCOUNTER — Ambulatory Visit
Admission: RE | Admit: 2023-12-02 | Discharge: 2023-12-02 | Disposition: A | Discharge status: Home / Self Care | Payer: Medicare PPO | Source: Ambulatory Visit | Attending: Radiation Oncology | Admitting: Radiation Oncology

## 2023-12-02 ENCOUNTER — Other Ambulatory Visit: Payer: Self-pay

## 2023-12-02 DIAGNOSIS — C50212 Malignant neoplasm of upper-inner quadrant of left female breast: Secondary | ICD-10-CM | POA: Diagnosis not present

## 2023-12-02 DIAGNOSIS — Z170 Estrogen receptor positive status [ER+]: Secondary | ICD-10-CM | POA: Diagnosis not present

## 2023-12-02 DIAGNOSIS — Z510 Encounter for antineoplastic radiation therapy: Secondary | ICD-10-CM | POA: Diagnosis not present

## 2023-12-02 DIAGNOSIS — C50412 Malignant neoplasm of upper-outer quadrant of left female breast: Secondary | ICD-10-CM | POA: Diagnosis not present

## 2023-12-02 LAB — RAD ONC ARIA SESSION SUMMARY
Course Elapsed Days: 4
Plan Fractions Treated to Date: 3
Plan Prescribed Dose Per Fraction: 2.66 Gy
Plan Total Fractions Prescribed: 16
Plan Total Prescribed Dose: 42.56 Gy
Reference Point Dosage Given to Date: 7.98 Gy
Reference Point Session Dosage Given: 2.66 Gy
Session Number: 3

## 2023-12-03 ENCOUNTER — Encounter: Payer: Self-pay | Admitting: *Deleted

## 2023-12-03 ENCOUNTER — Ambulatory Visit
Admission: RE | Admit: 2023-12-03 | Discharge: 2023-12-03 | Disposition: A | Discharge status: Home / Self Care | Payer: Medicare PPO | Source: Ambulatory Visit | Attending: Radiation Oncology | Admitting: Radiation Oncology

## 2023-12-03 ENCOUNTER — Other Ambulatory Visit: Payer: Self-pay

## 2023-12-03 DIAGNOSIS — Z510 Encounter for antineoplastic radiation therapy: Secondary | ICD-10-CM | POA: Diagnosis not present

## 2023-12-03 DIAGNOSIS — C50212 Malignant neoplasm of upper-inner quadrant of left female breast: Secondary | ICD-10-CM | POA: Diagnosis not present

## 2023-12-03 DIAGNOSIS — C50412 Malignant neoplasm of upper-outer quadrant of left female breast: Secondary | ICD-10-CM | POA: Diagnosis not present

## 2023-12-03 DIAGNOSIS — Z170 Estrogen receptor positive status [ER+]: Secondary | ICD-10-CM | POA: Diagnosis not present

## 2023-12-03 LAB — RAD ONC ARIA SESSION SUMMARY
Course Elapsed Days: 5
Plan Fractions Treated to Date: 4
Plan Prescribed Dose Per Fraction: 2.66 Gy
Plan Total Fractions Prescribed: 16
Plan Total Prescribed Dose: 42.56 Gy
Reference Point Dosage Given to Date: 10.64 Gy
Reference Point Session Dosage Given: 2.66 Gy
Session Number: 4

## 2023-12-04 ENCOUNTER — Ambulatory Visit
Admission: RE | Admit: 2023-12-04 | Discharge: 2023-12-04 | Disposition: A | Discharge status: Home / Self Care | Payer: Medicare PPO | Source: Ambulatory Visit | Attending: Radiation Oncology | Admitting: Radiation Oncology

## 2023-12-04 ENCOUNTER — Other Ambulatory Visit: Payer: Self-pay

## 2023-12-04 DIAGNOSIS — C50212 Malignant neoplasm of upper-inner quadrant of left female breast: Secondary | ICD-10-CM | POA: Diagnosis not present

## 2023-12-04 DIAGNOSIS — Z510 Encounter for antineoplastic radiation therapy: Secondary | ICD-10-CM | POA: Diagnosis not present

## 2023-12-04 DIAGNOSIS — C50412 Malignant neoplasm of upper-outer quadrant of left female breast: Secondary | ICD-10-CM | POA: Diagnosis not present

## 2023-12-04 DIAGNOSIS — Z170 Estrogen receptor positive status [ER+]: Secondary | ICD-10-CM | POA: Diagnosis not present

## 2023-12-04 LAB — RAD ONC ARIA SESSION SUMMARY
Course Elapsed Days: 6
Plan Fractions Treated to Date: 5
Plan Prescribed Dose Per Fraction: 2.66 Gy
Plan Total Fractions Prescribed: 16
Plan Total Prescribed Dose: 42.56 Gy
Reference Point Dosage Given to Date: 13.3 Gy
Reference Point Session Dosage Given: 2.66 Gy
Session Number: 5

## 2023-12-05 ENCOUNTER — Ambulatory Visit
Admission: RE | Admit: 2023-12-05 | Discharge: 2023-12-05 | Disposition: A | Discharge status: Home / Self Care | Payer: Medicare PPO | Source: Ambulatory Visit | Attending: Radiation Oncology | Admitting: Radiation Oncology

## 2023-12-05 ENCOUNTER — Other Ambulatory Visit: Payer: Self-pay

## 2023-12-05 DIAGNOSIS — Z510 Encounter for antineoplastic radiation therapy: Secondary | ICD-10-CM | POA: Diagnosis not present

## 2023-12-05 DIAGNOSIS — Z170 Estrogen receptor positive status [ER+]: Secondary | ICD-10-CM | POA: Diagnosis not present

## 2023-12-05 DIAGNOSIS — C50212 Malignant neoplasm of upper-inner quadrant of left female breast: Secondary | ICD-10-CM | POA: Diagnosis not present

## 2023-12-05 DIAGNOSIS — C50412 Malignant neoplasm of upper-outer quadrant of left female breast: Secondary | ICD-10-CM | POA: Diagnosis not present

## 2023-12-05 LAB — RAD ONC ARIA SESSION SUMMARY
Course Elapsed Days: 7
Plan Fractions Treated to Date: 6
Plan Prescribed Dose Per Fraction: 2.66 Gy
Plan Total Fractions Prescribed: 16
Plan Total Prescribed Dose: 42.56 Gy
Reference Point Dosage Given to Date: 15.96 Gy
Reference Point Session Dosage Given: 2.66 Gy
Session Number: 6

## 2023-12-06 ENCOUNTER — Other Ambulatory Visit: Payer: Self-pay

## 2023-12-06 ENCOUNTER — Ambulatory Visit
Admission: RE | Admit: 2023-12-06 | Discharge: 2023-12-06 | Disposition: A | Discharge status: Home / Self Care | Payer: Medicare PPO | Source: Ambulatory Visit | Attending: Radiation Oncology | Admitting: Radiation Oncology

## 2023-12-06 ENCOUNTER — Ambulatory Visit
Admission: RE | Admit: 2023-12-06 | Discharge: 2023-12-06 | Disposition: A | Discharge status: Home / Self Care | Payer: Medicare PPO | Source: Ambulatory Visit | Attending: Radiation Oncology

## 2023-12-06 DIAGNOSIS — C50412 Malignant neoplasm of upper-outer quadrant of left female breast: Secondary | ICD-10-CM | POA: Diagnosis not present

## 2023-12-06 DIAGNOSIS — C50212 Malignant neoplasm of upper-inner quadrant of left female breast: Secondary | ICD-10-CM | POA: Diagnosis not present

## 2023-12-06 DIAGNOSIS — Z170 Estrogen receptor positive status [ER+]: Secondary | ICD-10-CM | POA: Diagnosis not present

## 2023-12-06 DIAGNOSIS — Z510 Encounter for antineoplastic radiation therapy: Secondary | ICD-10-CM | POA: Diagnosis not present

## 2023-12-06 LAB — RAD ONC ARIA SESSION SUMMARY
Course Elapsed Days: 8
Plan Fractions Treated to Date: 7
Plan Prescribed Dose Per Fraction: 2.66 Gy
Plan Total Fractions Prescribed: 16
Plan Total Prescribed Dose: 42.56 Gy
Reference Point Dosage Given to Date: 18.62 Gy
Reference Point Session Dosage Given: 2.66 Gy
Session Number: 7

## 2023-12-09 ENCOUNTER — Ambulatory Visit
Admission: RE | Admit: 2023-12-09 | Discharge: 2023-12-09 | Disposition: A | Discharge status: Home / Self Care | Payer: Medicare PPO | Source: Ambulatory Visit | Attending: Radiation Oncology | Admitting: Radiation Oncology

## 2023-12-09 ENCOUNTER — Other Ambulatory Visit: Payer: Self-pay

## 2023-12-09 DIAGNOSIS — Z510 Encounter for antineoplastic radiation therapy: Secondary | ICD-10-CM | POA: Diagnosis not present

## 2023-12-09 DIAGNOSIS — C50212 Malignant neoplasm of upper-inner quadrant of left female breast: Secondary | ICD-10-CM | POA: Diagnosis not present

## 2023-12-09 DIAGNOSIS — Z170 Estrogen receptor positive status [ER+]: Secondary | ICD-10-CM | POA: Diagnosis not present

## 2023-12-09 DIAGNOSIS — C50412 Malignant neoplasm of upper-outer quadrant of left female breast: Secondary | ICD-10-CM | POA: Diagnosis not present

## 2023-12-09 LAB — RAD ONC ARIA SESSION SUMMARY
Course Elapsed Days: 11
Plan Fractions Treated to Date: 8
Plan Prescribed Dose Per Fraction: 2.66 Gy
Plan Total Fractions Prescribed: 16
Plan Total Prescribed Dose: 42.56 Gy
Reference Point Dosage Given to Date: 21.28 Gy
Reference Point Session Dosage Given: 2.66 Gy
Session Number: 8

## 2023-12-10 ENCOUNTER — Other Ambulatory Visit: Payer: Self-pay

## 2023-12-10 ENCOUNTER — Ambulatory Visit
Admission: RE | Admit: 2023-12-10 | Discharge: 2023-12-10 | Disposition: A | Discharge status: Home / Self Care | Payer: Medicare PPO | Source: Ambulatory Visit | Attending: Radiation Oncology | Admitting: Radiation Oncology

## 2023-12-10 DIAGNOSIS — Z170 Estrogen receptor positive status [ER+]: Secondary | ICD-10-CM | POA: Diagnosis not present

## 2023-12-10 DIAGNOSIS — Z510 Encounter for antineoplastic radiation therapy: Secondary | ICD-10-CM | POA: Diagnosis not present

## 2023-12-10 DIAGNOSIS — C50212 Malignant neoplasm of upper-inner quadrant of left female breast: Secondary | ICD-10-CM | POA: Diagnosis not present

## 2023-12-10 DIAGNOSIS — C50412 Malignant neoplasm of upper-outer quadrant of left female breast: Secondary | ICD-10-CM | POA: Diagnosis not present

## 2023-12-10 LAB — RAD ONC ARIA SESSION SUMMARY
Course Elapsed Days: 12
Plan Fractions Treated to Date: 9
Plan Prescribed Dose Per Fraction: 2.66 Gy
Plan Total Fractions Prescribed: 16
Plan Total Prescribed Dose: 42.56 Gy
Reference Point Dosage Given to Date: 23.94 Gy
Reference Point Session Dosage Given: 2.66 Gy
Session Number: 9

## 2023-12-11 ENCOUNTER — Other Ambulatory Visit: Payer: Self-pay

## 2023-12-11 ENCOUNTER — Ambulatory Visit: Payer: Medicare PPO

## 2023-12-11 ENCOUNTER — Ambulatory Visit
Admission: RE | Admit: 2023-12-11 | Discharge: 2023-12-11 | Disposition: A | Discharge status: Home / Self Care | Payer: Medicare PPO | Source: Ambulatory Visit | Attending: Radiation Oncology

## 2023-12-11 VITALS — Ht 63.0 in | Wt 159.0 lb

## 2023-12-11 DIAGNOSIS — C50212 Malignant neoplasm of upper-inner quadrant of left female breast: Secondary | ICD-10-CM | POA: Diagnosis not present

## 2023-12-11 DIAGNOSIS — Z170 Estrogen receptor positive status [ER+]: Secondary | ICD-10-CM | POA: Diagnosis not present

## 2023-12-11 DIAGNOSIS — Z510 Encounter for antineoplastic radiation therapy: Secondary | ICD-10-CM | POA: Diagnosis not present

## 2023-12-11 DIAGNOSIS — Z0000 Encounter for general adult medical examination without abnormal findings: Secondary | ICD-10-CM

## 2023-12-11 DIAGNOSIS — C50412 Malignant neoplasm of upper-outer quadrant of left female breast: Secondary | ICD-10-CM | POA: Diagnosis not present

## 2023-12-11 LAB — RAD ONC ARIA SESSION SUMMARY
Course Elapsed Days: 13
Plan Fractions Treated to Date: 10
Plan Prescribed Dose Per Fraction: 2.66 Gy
Plan Total Fractions Prescribed: 16
Plan Total Prescribed Dose: 42.56 Gy
Reference Point Dosage Given to Date: 26.6 Gy
Reference Point Session Dosage Given: 2.66 Gy
Session Number: 10

## 2023-12-11 NOTE — Progress Notes (Signed)
 Subjective:   Misty Wilkerson is a 83 y.o. female who presents for Medicare Annual (Subsequent) preventive examination.  Visit Complete: Virtual I connected with  Auralia Meiring on 12/11/23 by a audio enabled telemedicine application and verified that I am speaking with the correct person using two identifiers.  Patient Location: Home  Provider Location: Home Office  I discussed the limitations of evaluation and management by telemedicine. The patient expressed understanding and agreed to proceed.  This patient declined Interactive audio and Acupuncturist. Therefore the visit was completed with audio only.  Vital Signs: Because this visit was a virtual/telehealth visit, some criteria may be missing or patient reported. Any vitals not documented were not able to be obtained and vitals that have been documented are patient reported.  Patient Medicare AWV questionnaire was completed by the patient on 12/07/23; I have confirmed that all information answered by patient is correct and no changes since this date.  Cardiac Risk Factors include: advanced age (>62men, >68 women);smoking/ tobacco exposure;hypertension;dyslipidemia     Objective:    Today's Vitals   12/11/23 0911  Weight: 159 lb (72.1 kg)  Height: 5\' 3"  (1.6 m)   Body mass index is 28.17 kg/m.     12/11/2023    9:16 AM 11/12/2023    9:13 AM 09/30/2023    7:30 AM 09/24/2023   12:45 PM 12/07/2022   11:16 AM 12/25/2021    6:35 AM 12/12/2021    2:31 PM  Advanced Directives  Does Patient Have a Medical Advance Directive? No No No No Yes No No  Type of Agricultural consultant;Living will    Copy of Healthcare Power of Attorney in Chart?     No - copy requested No - copy requested No - copy requested  Would patient like information on creating a medical advance directive? Yes (MAU/Ambulatory/Procedural Areas - Information given) Yes (ED - Information included in AVS) No - Patient  declined No - Patient declined  No - Patient declined No - Patient declined    Current Medications (verified) Outpatient Encounter Medications as of 12/11/2023  Medication Sig   cholecalciferol (VITAMIN D3) 25 MCG (1000 UNIT) tablet Take 2 tablets (2,000 Units total) by mouth daily.   Multiple Vitamin (MULTIVITAMIN) tablet Take 1 tablet by mouth daily.   triamterene -hydrochlorothiazide (MAXZIDE-25) 37.5-25 MG tablet Take 0.5 tablets by mouth daily.   valsartan  (DIOVAN ) 160 MG tablet TAKE 1 TABLET BY MOUTH DAILY FOR BLOOD PRESSURE   vitamin B-12 (CYANOCOBALAMIN) 1000 MCG tablet Take 1,000 mcg by mouth daily.   [DISCONTINUED] lidocaine -prilocaine  (EMLA ) cream Apply to affected area once   No facility-administered encounter medications on file as of 12/11/2023.    Allergies (verified) Penicillins and Tetracyclines & related   History: Past Medical History:  Diagnosis Date   Allergy    Arthritis    Cancer (HCC)    left breast   H/O hiatal hernia    eats slowly  now   Hypertension    Personal history of radiation therapy 2002   left breast   Vertigo    Past Surgical History:  Procedure Laterality Date   BREAST BIOPSY Left 08/29/2023   US  LT BREAST BX W LOC DEV 1ST LESION IMG BX SPEC US  GUIDE 08/29/2023 [facility]   BREAST BIOPSY Left 09/27/2023   US  LT RADIOACTIVE SEED LOC 09/27/2023 [facility]   BREAST LUMPECTOMY Left 2002, 2014   BREAST LUMPECTOMY WITH RADIOACTIVE SEED LOCALIZATION Left 09/30/2023  Procedure: LEFT BREAST SEED GUIDED LUMPECTOMY;  Surgeon: Enid Harry, MD;  Location: [facility];  Service: General;  Laterality: Left;  LMA   CHOLECYSTECTOMY     COLONOSCOPY N/A 10/26/2015   Procedure: COLONOSCOPY;  Surgeon: Ruby Corporal, MD;  [facility];  Service: Endoscopy;  Laterality: N/A;  1200   DEBRIDEMENT AND CLOSURE WOUND Left 03/25/2018   Procedure: DEBRIDEMENTINTERPHALANGEAL LEFT INDEX FINGER;  Surgeon: Lyanne Sample, MD;   Location: [facility];  Service: Orthopedics;  Laterality: Left;   EXCISION MASS UPPER EXTREMETIES Left 03/25/2018   Procedure: EXCISION MASS LEFT INDEX;  Surgeon: Lyanne Sample, MD;  Location: [facility];  Service: Orthopedics;  Laterality: Left;   EYE SURGERY     bilateral cataracts   MAXILLARY ANTROSTOMY Left 12/25/2021   Procedure: LEFT MAXILLARY ANTROSTOMY WITH TISSUE REMOVAL;  Surgeon: Reynold Caves, MD;  Location: [facility];  Service: ENT;  Laterality: Left;   PARTIAL MASTECTOMY WITH NEEDLE LOCALIZATION AND AXILLARY SENTINEL LYMPH NODE BX Left 08/12/2013   Procedure: LEFT PARTIAL MASTECTOMY WITH NEEDLE LOCALIZATION ATTEMPTED AXILLARY SENTINEL LYMPH NODE BIOPSY;  Surgeon: Enid Harry, MD;  Location: [facility];  Service: General;  Laterality: Left;   RE-EXCISION OF BREAST LUMPECTOMY Left 08/27/2013   Procedure: RE-EXCISION OF BREAST LUMPECTOMY;  Surgeon: Enid Harry, MD;  Location: [facility];  Service: General;  Laterality: Left;   Family History  Problem Relation Age of Onset   Hypertension Mother    Diabetes Father    Stroke Father    Breast cancer Sister 39   Breast cancer Sister    Aneurysm Sister    Cancer Brother    Heart disease Brother    Social History   Socioeconomic History   Marital status: Married    Spouse name: Athena Bland   Number of children: 2   Years of education: Not on file   Highest education level: Bachelor's degree (e.g., BA, AB, BS)  Occupational History   Occupation: retired  Tobacco Use   Smoking status: Former    Current packs/day: 0.00    Average packs/day: 0.5 packs/day for 15.0 years (7.5 ttl pk-yrs)    Types: Cigarettes    Start date: 07/21/1984    Quit date: 07/22/1999    Years since quitting: 24.4   Smokeless tobacco: Never  Vaping Use   Vaping status: Never Used  Substance and Sexual Activity   Alcohol use: Yes    Comment: one alcohol drink daily   Drug use: No   Sexual activity: Yes  Other Topics  Concern   Not on file  Social History Narrative   Very independent and pretty active   Social Drivers of Corporate investment banker Strain: Low Risk  (12/11/2023)   Overall Financial Resource Strain (CARDIA)    Difficulty of Paying Living Expenses: Not hard at all  Food Insecurity: No Food Insecurity (12/11/2023)   Hunger Vital Sign    Worried About Running Out of Food in the Last Year: Never true    Ran Out of Food in the Last Year: Never true  Transportation Needs: No Transportation Needs (12/11/2023)   PRAPARE - Administrator, Civil Service (Medical): No    Lack of Transportation (Non-Medical): No  Physical Activity: Sufficiently Active (12/11/2023)   Exercise Vital Sign    Days of Exercise per Week: 5 days    Minutes of Exercise per Session: 30 min  Stress: No Stress Concern Present (12/11/2023)  [employer] of Occupational Health - Occupational Stress Questionnaire    Feeling of Stress : Only a little  Social Connections: Socially Integrated (12/11/2023)   Social Connection and Isolation Panel [NHANES]    Frequency of Communication with Friends and Family: More than three times a week    Frequency of Social Gatherings with Friends and Family: Once a week    Attends Religious Services: More than 4 times per year    Active Member of [employer] or Organizations: Yes    Attends Engineer, structural: More than 4 times per year    Marital Status: Married    Tobacco Counseling Counseling given: Not Answered   Clinical Intake:  Pre-visit preparation completed: Yes  Pain : No/denies pain     Diabetes: No  How often do you need to have someone help you when you read instructions, pamphlets, or other written materials from your doctor or pharmacy?: 1 - Never  Interpreter Needed?: No  Information entered by :: Seabron Cypress LPN   Activities of Daily Living    12/07/2023    9:40 AM 09/30/2023    7:26 AM  In your present state of health, do you  have any difficulty performing the following activities:  Hearing? 0 0  Vision? 0 0  Difficulty concentrating or making decisions? 0 0  Walking or climbing stairs? 0   Dressing or bathing? 0   Doing errands, shopping? 0   Preparing Food and eating ? N   Using the Toilet? N   In the past six months, have you accidently leaked urine? N   Do you have problems with loss of bowel control? N   Managing your Medications? N   Managing your Finances? N   Housekeeping or managing your Housekeeping? N     Patient Care Team: Roise Cleaver, MD as PCP - General (Family Medicine) Auther Bo, RN as Oncology Nurse Navigator Alane Hsu, RN as Oncology Nurse Navigator Cameron Cea, MD as Consulting Physician (Hematology and Oncology)  Indicate any recent Medical Services you may have received from other than [facility] providers in the past year (date may be approximate).     Assessment:   This is a routine wellness examination for Misty Wilkerson.  Hearing/Vision screen Hearing Screening - Comments:: Denies hearing difficulties   Vision Screening - Comments:: No vision problems; will schedule routine eye exam soon     Goals Addressed             This Visit's Progress    Remain active and independent        Depression Screen    12/11/2023    9:14 AM 09/24/2023    1:15 PM 08/06/2023   10:34 AM 02/18/2023    1:43 PM 02/18/2023    1:20 PM 12/19/2022   12:52 PM 12/07/2022   11:15 AM  PHQ 2/9 Scores  PHQ - 2 Score 2 2 0 0 0 0 0  PHQ- 9 Score 3   1   0    Fall Risk    12/11/2023    9:15 AM 12/07/2023    9:40 AM 08/06/2023   10:34 AM 02/18/2023    1:20 PM 12/19/2022   12:52 PM  Fall Risk   Falls in the past year? 0 0 0 0 0  Number falls in past yr: 0 0     Injury with Fall? 0 0     Risk for fall due to : No Fall Risks  Follow up Falls prevention discussed;Education provided;Falls evaluation completed        MEDICARE RISK AT HOME: Medicare Risk at Home Any stairs in or around  the home?: (Patient-Rptd) Yes If so, are there any without handrails?: (Patient-Rptd) No Home free of loose throw rugs in walkways, pet beds, electrical cords, etc?: (Patient-Rptd) Yes Adequate lighting in your home to reduce risk of falls?: (Patient-Rptd) Yes Life alert?: (Patient-Rptd) No Use of a cane, walker or w/c?: (Patient-Rptd) No Grab bars in the bathroom?: (Patient-Rptd) Yes Shower chair or bench in shower?: (Patient-Rptd) No Elevated toilet seat or a handicapped toilet?: (Patient-Rptd) No  TIMED UP AND GO:  Was the test performed?  No    Cognitive Function:        12/11/2023    9:16 AM 12/07/2022   11:17 AM 12/06/2021    6:50 PM  6CIT Screen  What Year? 0 points 0 points 0 points  What month? 0 points 0 points 0 points  What time? 0 points 0 points 0 points  Count back from 20 0 points 0 points 0 points  Months in reverse 0 points 0 points 0 points  Repeat phrase 0 points 0 points 0 points  Total Score 0 points 0 points 0 points    Immunizations Immunization History  Administered Date(s) Administered   Fluad Quad(high Dose 65+) 08/20/2022   Hepatitis B 08/13/1995, 09/17/1995   Influenza Split 08/14/2021   Influenza, High Dose Seasonal PF 09/08/2018   Influenza,inj,quad, With Preservative 10/05/2017   Moderna SARS-COV2 Booster Vaccination 09/28/2020, 08/15/2021   Moderna Sars-Covid-2 Vaccination 12/08/2019, 01/17/2021   Pneumococcal Conjugate-13 03/16/2014   Pneumococcal Polysaccharide-23 09/26/2009   Zoster Recombinant(Shingrix) 06/19/2019, 11/25/2019    TDAP status: Due, Education has been provided regarding the importance of this vaccine. Advised may receive this vaccine at local pharmacy or Health Dept. Aware to provide a copy of the vaccination record if obtained from local pharmacy or Health Dept. Verbalized acceptance and understanding.  Flu Vaccine status: Up to date  Pneumococcal vaccine status: Up to date  Covid-19 vaccine status: Information  provided on how to obtain vaccines.   Qualifies for Shingles Vaccine? Yes   Zostavax completed No   Shingrix Completed?: Yes  Screening Tests Health Maintenance  Topic Date Due   COVID-19 Vaccine (3 - Moderna risk series) 09/12/2021   DEXA SCAN  11/04/2023   INFLUENZA VACCINE  02/24/2024 (Originally 06/27/2023)   Medicare Annual Wellness (AWV)  12/10/2024   Pneumonia Vaccine 1+ Years old  Completed   Zoster Vaccines- Shingrix  Completed   HPV VACCINES  Aged Out   DTaP/Tdap/Td  Discontinued    Health Maintenance  Health Maintenance Due  Topic Date Due   COVID-19 Vaccine (3 - Moderna risk series) 09/12/2021   DEXA SCAN  11/04/2023    Colorectal cancer screening: No longer required.   Mammogram status: Completed 08/08/23. Repeat every year  Bone Density status: Completed 10/31/21. Results reflect: Bone density results: OSTEOPENIA. Repeat every 2 years.  Lung Cancer Screening: (Low Dose CT Chest recommended if Age 33-80 years, 20 pack-year currently smoking OR have quit w/in 15years.) does not qualify.   Lung Cancer Screening Referral: n/a  Additional Screening:  Hepatitis C Screening: does not qualify  Vision Screening: Recommended annual ophthalmology exams for early detection of glaucoma and other disorders of the eye. Is the patient up to date with their annual eye exam?  Yes  Who is the provider or what is the name of the office in which  the patient attends annual eye exams? Previously with Dr.Shapiro If pt is not established with a provider, would they like to be referred to a provider to establish care? No .   Dental Screening: Recommended annual dental exams for proper oral hygiene  Community Resource Referral / Chronic Care Management: CRR required this visit?  No   CCM required this visit?  No     Plan:     I have personally reviewed and noted the following in the patient's chart:   Medical and social history Use of alcohol, tobacco or illicit drugs   Current medications and supplements including opioid prescriptions. Patient is not currently taking opioid prescriptions. Functional ability and status Nutritional status Physical activity Advanced directives List of other physicians Hospitalizations, surgeries, and ER visits in previous 12 months Vitals Screenings to include cognitive, depression, and falls Referrals and appointments  In addition, I have reviewed and discussed with patient certain preventive protocols, quality metrics, and best practice recommendations. A written personalized care plan for preventive services as well as general preventive health recommendations were provided to patient.     Seabron Cypress [place], [place]   03/04/8118   After Visit Summary: (MyChart) Due to this being a telephonic visit, the after visit summary with patients personalized plan was offered to patient via MyChart   Nurse Notes: No concerns at this time

## 2023-12-11 NOTE — Patient Instructions (Signed)
 Misty Wilkerson , Thank you for taking time to come for your Medicare Wellness Visit. I appreciate your ongoing commitment to your health goals. Please review the following plan we discussed and let me know if I can assist you in the future.   Referrals/Orders/Follow-Ups/Clinician Recommendations: Aim for 30 minutes of exercise or brisk walking, 6-8 glasses of water , and 5 servings of fruits and vegetables each day.  This is a list of the screening recommended for you and due dates:  Health Maintenance  Topic Date Due   COVID-19 Vaccine (3 - Moderna risk series) 09/12/2021   DEXA scan (bone density measurement)  11/04/2023   Flu Shot  02/24/2024*   Medicare Annual Wellness Visit  12/10/2024   Pneumonia Vaccine  Completed   Zoster (Shingles) Vaccine  Completed   HPV Vaccine  Aged Out   DTaP/Tdap/Td vaccine  Discontinued  *Topic was postponed. The date shown is not the original due date.    Advanced directives: (ACP Link)Information on Advanced Care Planning can be found at [place]  Secretary of [facility] Advance Health Care Directives Advance Health Care Directives ([email or web address]   Next Medicare Annual Wellness Visit scheduled for next year: Yes

## 2023-12-12 ENCOUNTER — Other Ambulatory Visit: Payer: Self-pay

## 2023-12-12 ENCOUNTER — Ambulatory Visit
Admission: RE | Admit: 2023-12-12 | Discharge: 2023-12-12 | Disposition: A | Discharge status: Home / Self Care | Payer: Medicare PPO | Source: Ambulatory Visit | Attending: Radiation Oncology | Admitting: Radiation Oncology

## 2023-12-12 DIAGNOSIS — Z170 Estrogen receptor positive status [ER+]: Secondary | ICD-10-CM | POA: Diagnosis not present

## 2023-12-12 DIAGNOSIS — C50212 Malignant neoplasm of upper-inner quadrant of left female breast: Secondary | ICD-10-CM | POA: Diagnosis not present

## 2023-12-12 DIAGNOSIS — Z510 Encounter for antineoplastic radiation therapy: Secondary | ICD-10-CM | POA: Diagnosis not present

## 2023-12-12 DIAGNOSIS — C50412 Malignant neoplasm of upper-outer quadrant of left female breast: Secondary | ICD-10-CM | POA: Diagnosis not present

## 2023-12-12 LAB — RAD ONC ARIA SESSION SUMMARY
Course Elapsed Days: 14
Plan Fractions Treated to Date: 11
Plan Prescribed Dose Per Fraction: 2.66 Gy
Plan Total Fractions Prescribed: 16
Plan Total Prescribed Dose: 42.56 Gy
Reference Point Dosage Given to Date: 29.26 Gy
Reference Point Session Dosage Given: 2.66 Gy
Session Number: 11

## 2023-12-13 ENCOUNTER — Ambulatory Visit
Admission: RE | Admit: 2023-12-13 | Discharge: 2023-12-13 | Disposition: A | Discharge status: Home / Self Care | Payer: Medicare PPO | Source: Ambulatory Visit | Attending: Radiation Oncology | Admitting: Radiation Oncology

## 2023-12-13 ENCOUNTER — Ambulatory Visit: Payer: Medicare PPO | Admitting: Radiation Oncology

## 2023-12-13 ENCOUNTER — Other Ambulatory Visit: Payer: Self-pay

## 2023-12-13 ENCOUNTER — Ambulatory Visit
Admission: RE | Admit: 2023-12-13 | Discharge: 2023-12-13 | Disposition: A | Discharge status: Home / Self Care | Payer: Medicare PPO | Source: Ambulatory Visit | Attending: Radiation Oncology

## 2023-12-13 ENCOUNTER — Other Ambulatory Visit: Payer: Self-pay | Admitting: Radiology

## 2023-12-13 DIAGNOSIS — C50412 Malignant neoplasm of upper-outer quadrant of left female breast: Secondary | ICD-10-CM | POA: Diagnosis not present

## 2023-12-13 DIAGNOSIS — Z170 Estrogen receptor positive status [ER+]: Secondary | ICD-10-CM | POA: Diagnosis not present

## 2023-12-13 DIAGNOSIS — Z510 Encounter for antineoplastic radiation therapy: Secondary | ICD-10-CM | POA: Diagnosis not present

## 2023-12-13 DIAGNOSIS — C50212 Malignant neoplasm of upper-inner quadrant of left female breast: Secondary | ICD-10-CM | POA: Diagnosis not present

## 2023-12-13 LAB — RAD ONC ARIA SESSION SUMMARY
Course Elapsed Days: 15
Plan Fractions Treated to Date: 12
Plan Prescribed Dose Per Fraction: 2.66 Gy
Plan Total Fractions Prescribed: 16
Plan Total Prescribed Dose: 42.56 Gy
Reference Point Dosage Given to Date: 31.92 Gy
Reference Point Session Dosage Given: 2.66 Gy
Session Number: 12

## 2023-12-16 ENCOUNTER — Other Ambulatory Visit: Payer: Self-pay

## 2023-12-16 ENCOUNTER — Ambulatory Visit: Payer: Medicare PPO

## 2023-12-16 ENCOUNTER — Ambulatory Visit
Admission: RE | Admit: 2023-12-16 | Discharge: 2023-12-16 | Disposition: A | Discharge status: Home / Self Care | Payer: Medicare PPO | Source: Ambulatory Visit | Attending: Radiation Oncology | Admitting: Radiation Oncology

## 2023-12-16 DIAGNOSIS — Z170 Estrogen receptor positive status [ER+]: Secondary | ICD-10-CM | POA: Diagnosis not present

## 2023-12-16 DIAGNOSIS — C50412 Malignant neoplasm of upper-outer quadrant of left female breast: Secondary | ICD-10-CM | POA: Diagnosis not present

## 2023-12-16 DIAGNOSIS — C50212 Malignant neoplasm of upper-inner quadrant of left female breast: Secondary | ICD-10-CM | POA: Diagnosis not present

## 2023-12-16 DIAGNOSIS — Z510 Encounter for antineoplastic radiation therapy: Secondary | ICD-10-CM | POA: Diagnosis not present

## 2023-12-16 LAB — RAD ONC ARIA SESSION SUMMARY
Course Elapsed Days: 18
Plan Fractions Treated to Date: 13
Plan Prescribed Dose Per Fraction: 2.66 Gy
Plan Total Fractions Prescribed: 16
Plan Total Prescribed Dose: 42.56 Gy
Reference Point Dosage Given to Date: 34.58 Gy
Reference Point Session Dosage Given: 2.66 Gy
Session Number: 13

## 2023-12-17 ENCOUNTER — Inpatient Hospital Stay: Payer: Medicare PPO | Admitting: Hematology and Oncology

## 2023-12-17 ENCOUNTER — Inpatient Hospital Stay: Payer: Medicare PPO

## 2023-12-17 ENCOUNTER — Other Ambulatory Visit: Payer: Self-pay

## 2023-12-17 ENCOUNTER — Encounter: Payer: Self-pay | Admitting: Hematology and Oncology

## 2023-12-17 ENCOUNTER — Ambulatory Visit
Admission: RE | Admit: 2023-12-17 | Discharge: 2023-12-17 | Disposition: A | Discharge status: Home / Self Care | Payer: Medicare PPO | Source: Ambulatory Visit | Attending: Radiation Oncology

## 2023-12-17 ENCOUNTER — Ambulatory Visit: Payer: Medicare PPO

## 2023-12-17 VITALS — BP 138/49 | HR 86 | Temp 98.6°F | Resp 17 | Wt 162.1 lb

## 2023-12-17 DIAGNOSIS — C50412 Malignant neoplasm of upper-outer quadrant of left female breast: Secondary | ICD-10-CM | POA: Diagnosis not present

## 2023-12-17 DIAGNOSIS — Z170 Estrogen receptor positive status [ER+]: Secondary | ICD-10-CM | POA: Insufficient documentation

## 2023-12-17 DIAGNOSIS — Z5112 Encounter for antineoplastic immunotherapy: Secondary | ICD-10-CM | POA: Insufficient documentation

## 2023-12-17 DIAGNOSIS — R350 Frequency of micturition: Secondary | ICD-10-CM | POA: Insufficient documentation

## 2023-12-17 DIAGNOSIS — C50212 Malignant neoplasm of upper-inner quadrant of left female breast: Secondary | ICD-10-CM | POA: Diagnosis not present

## 2023-12-17 DIAGNOSIS — Z510 Encounter for antineoplastic radiation therapy: Secondary | ICD-10-CM | POA: Diagnosis not present

## 2023-12-17 LAB — RAD ONC ARIA SESSION SUMMARY
Course Elapsed Days: 19
Plan Fractions Treated to Date: 14
Plan Prescribed Dose Per Fraction: 2.66 Gy
Plan Total Fractions Prescribed: 16
Plan Total Prescribed Dose: 42.56 Gy
Reference Point Dosage Given to Date: 37.24 Gy
Reference Point Session Dosage Given: 2.66 Gy
Session Number: 14

## 2023-12-17 MED ORDER — DIPHENHYDRAMINE HCL 25 MG PO CAPS
50.0000 mg | ORAL_CAPSULE | Freq: Once | ORAL | Status: AC
  Administered 2023-12-17: 50 mg via ORAL
  Filled 2023-12-17: qty 2

## 2023-12-17 MED ORDER — ACETAMINOPHEN 325 MG PO TABS
650.0000 mg | ORAL_TABLET | Freq: Once | ORAL | Status: AC
Start: 2023-12-17 — End: 2023-12-17
  Administered 2023-12-17: 650 mg via ORAL
  Filled 2023-12-17: qty 2

## 2023-12-17 MED ORDER — SODIUM CHLORIDE 0.9 % IV SOLN: INTRAVENOUS | Status: DC

## 2023-12-17 MED ORDER — TRASTUZUMAB-ANNS CHEMO 150 MG IV SOLR
6.0000 mg/kg | Freq: Once | INTRAVENOUS | Status: AC
  Administered 2023-12-17: 420 mg via INTRAVENOUS
  Filled 2023-12-17: qty 20

## 2023-12-17 NOTE — Assessment & Plan Note (Signed)
This is a 84 year old female patient with past medical history significant for left breast cancer in 2000, 2014 now presents with new onset left breast invasive ductal carcinoma, grade 3, ER/PR and HER2 amplified.  Patient has followed up with Dr. Dwain Sarna and the recommendation was to consider mastectomy given the HER2 amplified breast cancer but patient is strongly opposed to it.  She is not willing to consider mastectomy especially because she is worried about her quality of life.  She is now status post lumpectomy, final pathology showed grade 2 IDC measuring 1 x 0.8 x 0.5 cm, negative margins, no sentinel lymph nodes submitted. She did not want to consider adjuvant chemotherapy hence have discussed about subcutaneous/IV Herceptin every 3 weeks for a whole year along with antiestrogen therapy once she completes radiation.  Triple Positive Breast Cancer Undergoing radiation therapy with one week remaining. Receiving Herceptin with no reported side effects. Patient is fatigued, likely secondary to radiation therapy. -Continue Herceptin until completion before Christmas 2025. -Resume Letrozole at next visit prior to next Herceptin infusion. -Check cardiac function every three months due to Herceptin use, next check in March 2025.  Frequent Urination Likely age-related decrease in bladder capacity. -No intervention required at this time.  General Health Maintenance / Followup Plans -Physical therapy for lymphedema to be scheduled after completion of radiation therapy. -Follow-up visit after next Herceptin infusion to resume Letrozole. -Subsequent visits to occur every other Herceptin infusion.    Total time: 30 min

## 2023-12-17 NOTE — Patient Instructions (Signed)
 [facility] - A DEPT OF [facility]  Discharge Instructions: Thank you for choosing [facility] to provide your oncology and hematology care.   If you have a lab appointment with the Cancer Center, please go directly to the Cancer Center and check in at the registration area.   Wear comfortable clothing and clothing appropriate for easy access to any Portacath or PICC line.   We strive to give you quality time with your provider. You may need to reschedule your appointment if you arrive late (15 or more minutes).  Arriving late affects you and other patients whose appointments are after yours.  Also, if you miss three or more appointments without notifying the office, you may be dismissed from the clinic at the provider's discretion.      For prescription refill requests, have your pharmacy contact our office and allow 72 hours for refills to be completed.    Today you received the following chemotherapy and/or immunotherapy agents Kanjinti      To help prevent nausea and vomiting after your treatment, we encourage you to take your nausea medication as directed.  BELOW ARE SYMPTOMS THAT SHOULD BE REPORTED IMMEDIATELY: *FEVER GREATER THAN 100.4 F (38 C) OR HIGHER *CHILLS OR SWEATING *NAUSEA AND VOMITING THAT IS NOT CONTROLLED WITH YOUR NAUSEA MEDICATION *UNUSUAL SHORTNESS OF BREATH *UNUSUAL BRUISING OR BLEEDING *URINARY PROBLEMS (pain or burning when urinating, or frequent urination) *BOWEL PROBLEMS (unusual diarrhea, constipation, pain near the anus) TENDERNESS IN MOUTH AND THROAT WITH OR WITHOUT PRESENCE OF ULCERS (sore throat, sores in mouth, or a toothache) UNUSUAL RASH, SWELLING OR PAIN  UNUSUAL VAGINAL DISCHARGE OR ITCHING   Items with * indicate a potential emergency and should be followed up as soon as possible or go to the Emergency Department if any problems should occur.  Please show the CHEMOTHERAPY ALERT CARD or IMMUNOTHERAPY  ALERT CARD at check-in to the Emergency Department and triage nurse.  Should you have questions after your visit or need to cancel or reschedule your appointment, please contact [facility] - A DEPT OF [facility]  Dept: [phone]  and follow the prompts.  Office hours are 8:00 a.m. to 4:30 p.m. Monday - Friday. Please note that voicemails left after 4:00 p.m. may not be returned until the following business day.  We are closed weekends and major holidays. You have access to a nurse at all times for urgent questions. Please call the main number to the clinic Dept: [phone] and follow the prompts.   For any non-urgent questions, you may also contact your provider using MyChart. We now offer e-Visits for anyone 69 and older to request care online for non-urgent symptoms. For details visit [email or web address].   Also download the MyChart app! Go to the app store, search "MyChart", open the app, select [place], and log in with your MyChart username and password.

## 2023-12-17 NOTE — Progress Notes (Signed)
[facility] CONSULT NOTE  Patient Care Team: [name], MD as PCP - General (Family Medicine) Pershing Proud, RN as Oncology Nurse Navigator Donnelly Angelica, RN as Oncology Nurse Navigator Serena Croissant, MD as Consulting Physician (Hematology and Oncology)  CHIEF COMPLAINTS/PURPOSE OF CONSULTATION:  Newly diagnosed breast cancer  HISTORY OF PRESENTING ILLNESS:  Misty Wilkerson 83 y.o. female is here because of recent diagnosis of left breast cancer  I reviewed her records extensively and collaborated the history with the patient.  SUMMARY OF ONCOLOGIC HISTORY: Oncology History  Breast cancer of upper-inner quadrant of left female breast (HCC)  12/08/1998 Initial Biopsy   Left breast cancer: Lumpectomy followed by radiation T1 BN 0M0 could not tolerate tamoxifen or alternative therapies.   08/12/2013 Surgery   Left breast lumpectomy: Tumor 1  invasive ductal carcinoma 0.4 cm with DCIS, tumor to: IDC 1.2 cm with DCIS positive margin, ER 98%, PR 14%, HER-2 negative, Ki-67 19% margins reexcision negative   09/07/2013 -  Anti-estrogen oral therapy   Letrozole 2.5 mg daily plans for 5 years   Breast cancer of upper-outer quadrant of left female breast (HCC)  09/10/2023 Initial Diagnosis   Breast cancer of upper-outer quadrant of left female breast (HCC)   09/10/2023 Cancer Staging   Staging form: Breast, AJCC 8th Edition - Clinical: Stage IA (cT1, cN0, cM0, G3, ER+, PR+, HER2+) - Signed by Rachel Moulds, MD on 09/10/2023 Stage prefix: Initial diagnosis Histologic grading system: 3 grade system   11/25/2023 -  Chemotherapy   Patient is on Treatment Plan : BREAST Trastuzumab IV (8/6) or SQ (600) D1 q21d     11/26/2023 - 11/26/2023 Chemotherapy   Patient is on Treatment Plan : BREAST Paclitaxel + Trastuzumab q7d / Trastuzumab q21d     11/26/2023 - 11/26/2023 Chemotherapy   Patient is on Treatment Plan : BREAST Trastuzumab SQ (600) D1 q21d      The  patient, an 83 year old woman with a history of breast cancer, presents with a new breast mass. This is her third occurrence of breast cancer, with previous diagnoses in 2000 and 2014.  Since her last visit here she had left breast lumpectomy, final pathology showed invasive ductal carcinoma measuring 1 x 0.8 x 0.5 cm, grade 2, DCIS focal solid type grade 2, negative margins for both invasive and DCIS.  All margins are negative for malignancy.  No lymph node specimen was submitted.  Prognostics from previous breast biopsy showed ER +100% strong PR +100% strong and HER2 positive by FISH with Ki-67 of 30%.   Discussed the use of AI scribe software for clinical note transcription with the patient, who gave verbal consent to proceed.  History of Present Illness    The patient, an 83 year old with a history of triple positive breast cancer, presents for a follow-up while on adj radiation and herceptin. She reports fatigue, which she attributes to the radiation therapy and the daily commute for treatment.  The patient is also receiving Herceptin, which she tolerates well. She has researched her treatment plan and is satisfied that it aligns with standard protocols for her diagnosis.  The patient also reports frequent urination, which she attributes to age.  The patient is looking forward to celebrating the end of her radiation therapy with a bottle of champagne. She is ok with going back on letrozole.  Rest of the pertinent 10 point ROS reviewed and neg.   MEDICAL HISTORY:  Past Medical History:  Diagnosis Date   Allergy  Arthritis    Cancer (HCC)    left breast   H/O hiatal hernia    eats slowly  now   Hypertension    Personal history of radiation therapy 2002   left breast   Vertigo     SURGICAL HISTORY: Past Surgical History:  Procedure Laterality Date   BREAST BIOPSY Left 08/29/2023   [place] LT BREAST BX W LOC DEV 1ST LESION IMG BX SPEC US GUIDE 08/29/2023 [facility]   BREAST  BIOPSY Left 09/27/2023   [place] LT RADIOACTIVE SEED LOC 09/27/2023 [facility]   BREAST LUMPECTOMY Left 2002, 2014   BREAST LUMPECTOMY WITH RADIOACTIVE SEED LOCALIZATION Left 09/30/2023   Procedure: LEFT BREAST SEED GUIDED LUMPECTOMY;  Surgeon: [name], MD;  Location: [facility];  Service: General;  Laterality: Left;  LMA   CHOLECYSTECTOMY     COLONOSCOPY N/A 10/26/2015   Procedure: COLONOSCOPY;  Surgeon: Malissa Hippo, MD;  [facility];  Service: Endoscopy;  Laterality: N/A;  1200   DEBRIDEMENT AND CLOSURE WOUND Left 03/25/2018   Procedure: DEBRIDEMENTINTERPHALANGEAL LEFT INDEX FINGER;  Surgeon: Cindee Salt, MD;  Location: [facility];  Service: Orthopedics;  Laterality: Left;   EXCISION MASS UPPER EXTREMETIES Left 03/25/2018   Procedure: EXCISION MASS LEFT INDEX;  Surgeon: Cindee Salt, MD;  Location: [facility];  Service: Orthopedics;  Laterality: Left;   EYE SURGERY     bilateral cataracts   MAXILLARY ANTROSTOMY Left 12/25/2021   Procedure: LEFT MAXILLARY ANTROSTOMY WITH TISSUE REMOVAL;  Surgeon: Newman Pies, MD;  Location: [facility];  Service: ENT;  Laterality: Left;   PARTIAL MASTECTOMY WITH NEEDLE LOCALIZATION AND AXILLARY SENTINEL LYMPH NODE BX Left 08/12/2013   Procedure: LEFT PARTIAL MASTECTOMY WITH NEEDLE LOCALIZATION ATTEMPTED AXILLARY SENTINEL LYMPH NODE BIOPSY;  Surgeon: [name], MD;  Location: [facility];  Service: General;  Laterality: Left;   RE-EXCISION OF BREAST LUMPECTOMY Left 08/27/2013   Procedure: RE-EXCISION OF BREAST LUMPECTOMY;  Surgeon: [name], MD;  Location: [facility];  Service: General;  Laterality: Left;    SOCIAL HISTORY: Social History   Socioeconomic History   Marital status: Married    Spouse name: Kathlene November   Number of children: 2   Years of education: Not on file   Highest education level: Bachelor's degree (e.g., BA, AB, BS)  Occupational History   Occupation:  retired  Tobacco Use   Smoking status: Former    Current packs/day: 0.00    Average packs/day: 0.5 packs/day for 15.0 years (7.5 ttl pk-yrs)    Types: Cigarettes    Start date: 07/21/1984    Quit date: 07/22/1999    Years since quitting: 24.4   Smokeless tobacco: Never  Vaping Use   Vaping status: Never Used  Substance and Sexual Activity   Alcohol use: Yes    Comment: one alcohol drink daily   Drug use: No   Sexual activity: Yes  Other Topics Concern   Not on file  Social History Narrative   Very independent and pretty active   Social Drivers of Corporate investment banker Strain: Low Risk  (12/11/2023)   Overall Financial Resource Strain (CARDIA)    Difficulty of Paying Living Expenses: Not hard at all  Food Insecurity: No Food Insecurity (12/11/2023)   Hunger Vital Sign    Worried About Running Out of Food in the Last Year: Never true    Ran Out of Food in the Last Year: Never true  Transportation Needs:  No Transportation Needs (12/11/2023)   PRAPARE - Administrator, Civil Service (Medical): No    Lack of Transportation (Non-Medical): No  Physical Activity: Sufficiently Active (12/11/2023)   Exercise Vital Sign    Days of Exercise per Week: 5 days    Minutes of Exercise per Session: 30 min  Stress: No Stress Concern Present (12/11/2023)   [employer] of Occupational Health - Occupational Stress Questionnaire    Feeling of Stress : Only a little  Social Connections: Socially Integrated (12/11/2023)   Social Connection and Isolation Panel [NHANES]    Frequency of Communication with Friends and Family: More than three times a week    Frequency of Social Gatherings with Friends and Family: Once a week    Attends Religious Services: More than 4 times per year    Active Member of [employer] or Organizations: Yes    Attends Engineer, structural: More than 4 times per year    Marital Status: Married  Catering manager Violence: Not At Risk (12/11/2023)    Humiliation, Afraid, Rape, and Kick questionnaire    Fear of Current or Ex-Partner: No    Emotionally Abused: No    Physically Abused: No    Sexually Abused: No    FAMILY HISTORY: Family History  Problem Relation Age of Onset   Hypertension Mother    Diabetes Father    Stroke Father    Breast cancer Sister 55   Breast cancer Sister    Aneurysm Sister    Cancer Brother    Heart disease Brother     ALLERGIES:  is allergic to penicillins and tetracyclines & related.  MEDICATIONS:  Current Outpatient Medications  Medication Sig Dispense Refill   cholecalciferol (VITAMIN D3) 25 MCG (1000 UNIT) tablet Take 2 tablets (2,000 Units total) by mouth daily.     Multiple Vitamin (MULTIVITAMIN) tablet Take 1 tablet by mouth daily.     triamterene-hydrochlorothiazide (MAXZIDE-25) 37.5-25 MG tablet Take 0.5 tablets by mouth daily. 45 tablet 3   valsartan (DIOVAN) 160 MG tablet TAKE 1 TABLET BY MOUTH DAILY FOR BLOOD PRESSURE 90 tablet 0   vitamin B-12 (CYANOCOBALAMIN) 1000 MCG tablet Take 1,000 mcg by mouth daily.     No current facility-administered medications for this visit.   Facility-Administered Medications Ordered in Other Visits  Medication Dose Route Frequency Provider Last Rate Last Admin   0.9 %  sodium chloride infusion   Intravenous Continuous Douglas Smolinsky, Burnice Logan, MD 10 mL/hr at 12/17/23 1505 New Bag at 12/17/23 1505   trastuzumab-anns (KANJINTI) 420 mg in sodium chloride 0.9 % 250 mL chemo infusion  6 mg/kg (Treatment Plan Recorded) Intravenous Once Rachel Moulds, MD         PHYSICAL EXAMINATION: ECOG PERFORMANCE STATUS: 0 - Asymptomatic  Vitals:   12/17/23 1420  BP: (!) 138/49  Pulse: 86  Resp: 17  Temp: 98.6 F (37 C)  SpO2: 99%    Filed Weights   12/17/23 1420  Weight: 162 lb 1.6 oz (73.5 kg)     GENERAL:alert, no distress and comfortable Chest: Clear to auscultation bilaterally Heart: Rate and rhythm regular Abdomen: Soft, nontender, nondistended No lower  extremity edema  LABORATORY DATA:  I have reviewed the data as listed Lab Results  Component Value Date   WBC 6.0 08/06/2023   HGB 13.6 08/06/2023   HCT 41.8 08/06/2023   MCV 97 08/06/2023   PLT 239 08/06/2023   Lab Results  Component Value Date   NA 135 09/24/2023  K 4.6 09/24/2023   CL 105 09/24/2023   CO2 21 (L) 09/24/2023    RADIOGRAPHIC STUDIES: I have personally reviewed the radiological reports and agreed with the findings in the report.  ASSESSMENT AND PLAN:  Breast cancer of upper-inner quadrant of left female breast This is a 83 year old female patient with past medical history significant for left breast cancer in 2000, 2014 now presents with new onset left breast invasive ductal carcinoma, grade 3, ER/PR and HER2 amplified.  Patient has followed up with Dr. Dwain Sarna and the recommendation was to consider mastectomy given the HER2 amplified breast cancer but patient is strongly opposed to it.  She is not willing to consider mastectomy especially because she is worried about her quality of life.  She is now status post lumpectomy, final pathology showed grade 2 IDC measuring 1 x 0.8 x 0.5 cm, negative margins, no sentinel lymph nodes submitted. She did not want to consider adjuvant chemotherapy hence have discussed about subcutaneous/IV Herceptin every 3 weeks for a whole year along with antiestrogen therapy once she completes radiation.  Triple Positive Breast Cancer Undergoing radiation therapy with one week remaining. Receiving Herceptin with no reported side effects. Patient is fatigued, likely secondary to radiation therapy. -Continue Herceptin until completion before Christmas 2025. -Resume Letrozole at next visit prior to next Herceptin infusion. -Check cardiac function every three months due to Herceptin use, next check in March 2025.  Frequent Urination Likely age-related decrease in bladder capacity. -No intervention required at this time.  General Health  Maintenance / Followup Plans -Physical therapy for lymphedema to be scheduled after completion of radiation therapy. -Follow-up visit after next Herceptin infusion to resume Letrozole. -Subsequent visits to occur every other Herceptin infusion.    Total time: 30 min      All questions were answered. The patient knows to call the clinic with any problems, questions or concerns.    Rachel Moulds, MD 12/17/23

## 2023-12-18 ENCOUNTER — Other Ambulatory Visit: Payer: Self-pay

## 2023-12-18 ENCOUNTER — Ambulatory Visit
Admission: RE | Admit: 2023-12-18 | Discharge: 2023-12-18 | Disposition: A | Discharge status: Home / Self Care | Payer: Medicare PPO | Source: Ambulatory Visit | Attending: Radiation Oncology

## 2023-12-18 ENCOUNTER — Ambulatory Visit: Payer: Medicare PPO

## 2023-12-18 DIAGNOSIS — Z170 Estrogen receptor positive status [ER+]: Secondary | ICD-10-CM | POA: Diagnosis not present

## 2023-12-18 DIAGNOSIS — C50212 Malignant neoplasm of upper-inner quadrant of left female breast: Secondary | ICD-10-CM | POA: Diagnosis not present

## 2023-12-18 DIAGNOSIS — C50412 Malignant neoplasm of upper-outer quadrant of left female breast: Secondary | ICD-10-CM | POA: Diagnosis not present

## 2023-12-18 DIAGNOSIS — Z510 Encounter for antineoplastic radiation therapy: Secondary | ICD-10-CM | POA: Diagnosis not present

## 2023-12-18 LAB — RAD ONC ARIA SESSION SUMMARY
Course Elapsed Days: 20
Plan Fractions Treated to Date: 15
Plan Prescribed Dose Per Fraction: 2.66 Gy
Plan Total Fractions Prescribed: 16
Plan Total Prescribed Dose: 42.56 Gy
Reference Point Dosage Given to Date: 39.9 Gy
Reference Point Session Dosage Given: 2.66 Gy
Session Number: 15

## 2023-12-19 ENCOUNTER — Encounter: Payer: Self-pay | Admitting: Hematology and Oncology

## 2023-12-19 ENCOUNTER — Other Ambulatory Visit: Payer: Self-pay

## 2023-12-19 ENCOUNTER — Ambulatory Visit: Payer: Medicare PPO

## 2023-12-19 ENCOUNTER — Ambulatory Visit
Admission: RE | Admit: 2023-12-19 | Discharge: 2023-12-19 | Disposition: A | Discharge status: Home / Self Care | Payer: Medicare PPO | Source: Ambulatory Visit | Attending: Radiation Oncology | Admitting: Radiation Oncology

## 2023-12-19 DIAGNOSIS — C50212 Malignant neoplasm of upper-inner quadrant of left female breast: Secondary | ICD-10-CM | POA: Diagnosis not present

## 2023-12-19 DIAGNOSIS — Z510 Encounter for antineoplastic radiation therapy: Secondary | ICD-10-CM | POA: Diagnosis not present

## 2023-12-19 DIAGNOSIS — Z170 Estrogen receptor positive status [ER+]: Secondary | ICD-10-CM | POA: Diagnosis not present

## 2023-12-19 DIAGNOSIS — C50412 Malignant neoplasm of upper-outer quadrant of left female breast: Secondary | ICD-10-CM | POA: Diagnosis not present

## 2023-12-19 LAB — RAD ONC ARIA SESSION SUMMARY
Course Elapsed Days: 21
Plan Fractions Treated to Date: 16
Plan Prescribed Dose Per Fraction: 2.66 Gy
Plan Total Fractions Prescribed: 16
Plan Total Prescribed Dose: 42.56 Gy
Reference Point Dosage Given to Date: 42.56 Gy
Reference Point Session Dosage Given: 2.66 Gy
Session Number: 16

## 2023-12-20 ENCOUNTER — Other Ambulatory Visit: Payer: Self-pay

## 2023-12-20 ENCOUNTER — Ambulatory Visit
Admission: RE | Admit: 2023-12-20 | Discharge: 2023-12-20 | Disposition: A | Discharge status: Home / Self Care | Payer: Medicare PPO | Source: Ambulatory Visit | Attending: Radiation Oncology

## 2023-12-20 ENCOUNTER — Ambulatory Visit
Admission: RE | Admit: 2023-12-20 | Discharge: 2023-12-20 | Disposition: A | Discharge status: Home / Self Care | Payer: Medicare PPO | Source: Ambulatory Visit | Attending: Radiation Oncology | Admitting: Radiation Oncology

## 2023-12-20 DIAGNOSIS — C50412 Malignant neoplasm of upper-outer quadrant of left female breast: Secondary | ICD-10-CM | POA: Diagnosis not present

## 2023-12-20 DIAGNOSIS — C50212 Malignant neoplasm of upper-inner quadrant of left female breast: Secondary | ICD-10-CM | POA: Diagnosis not present

## 2023-12-20 DIAGNOSIS — Z170 Estrogen receptor positive status [ER+]: Secondary | ICD-10-CM | POA: Diagnosis not present

## 2023-12-20 LAB — RAD ONC ARIA SESSION SUMMARY
Course Elapsed Days: 22
Plan Fractions Treated to Date: 1
Plan Prescribed Dose Per Fraction: 2 Gy
Plan Total Fractions Prescribed: 4
Plan Total Prescribed Dose: 8 Gy
Reference Point Dosage Given to Date: 2 Gy
Reference Point Session Dosage Given: 2 Gy
Session Number: 17

## 2023-12-23 ENCOUNTER — Other Ambulatory Visit: Payer: Self-pay

## 2023-12-23 ENCOUNTER — Ambulatory Visit
Admission: RE | Admit: 2023-12-23 | Discharge: 2023-12-23 | Disposition: A | Discharge status: Home / Self Care | Payer: Medicare PPO | Source: Ambulatory Visit | Attending: Radiation Oncology

## 2023-12-23 DIAGNOSIS — Z170 Estrogen receptor positive status [ER+]: Secondary | ICD-10-CM | POA: Diagnosis not present

## 2023-12-23 DIAGNOSIS — C50412 Malignant neoplasm of upper-outer quadrant of left female breast: Secondary | ICD-10-CM | POA: Diagnosis not present

## 2023-12-23 DIAGNOSIS — C50212 Malignant neoplasm of upper-inner quadrant of left female breast: Secondary | ICD-10-CM | POA: Diagnosis not present

## 2023-12-23 LAB — RAD ONC ARIA SESSION SUMMARY
Course Elapsed Days: 25
Plan Fractions Treated to Date: 2
Plan Prescribed Dose Per Fraction: 2 Gy
Plan Total Fractions Prescribed: 4
Plan Total Prescribed Dose: 8 Gy
Reference Point Dosage Given to Date: 4 Gy
Reference Point Session Dosage Given: 2 Gy
Session Number: 18

## 2023-12-24 ENCOUNTER — Other Ambulatory Visit: Payer: Self-pay

## 2023-12-24 ENCOUNTER — Ambulatory Visit
Admission: RE | Admit: 2023-12-24 | Discharge: 2023-12-24 | Disposition: A | Discharge status: Home / Self Care | Payer: Medicare PPO | Source: Ambulatory Visit | Attending: Radiation Oncology

## 2023-12-24 DIAGNOSIS — C50412 Malignant neoplasm of upper-outer quadrant of left female breast: Secondary | ICD-10-CM | POA: Diagnosis not present

## 2023-12-24 DIAGNOSIS — Z170 Estrogen receptor positive status [ER+]: Secondary | ICD-10-CM | POA: Diagnosis not present

## 2023-12-24 DIAGNOSIS — C50212 Malignant neoplasm of upper-inner quadrant of left female breast: Secondary | ICD-10-CM | POA: Diagnosis not present

## 2023-12-24 LAB — RAD ONC ARIA SESSION SUMMARY
Course Elapsed Days: 26
Plan Fractions Treated to Date: 3
Plan Prescribed Dose Per Fraction: 2 Gy
Plan Total Fractions Prescribed: 4
Plan Total Prescribed Dose: 8 Gy
Reference Point Dosage Given to Date: 6 Gy
Reference Point Session Dosage Given: 2 Gy
Session Number: 19

## 2023-12-25 ENCOUNTER — Ambulatory Visit
Admission: RE | Admit: 2023-12-25 | Discharge: 2023-12-25 | Disposition: A | Discharge status: Home / Self Care | Payer: Medicare PPO | Source: Ambulatory Visit | Attending: Radiation Oncology

## 2023-12-25 ENCOUNTER — Other Ambulatory Visit: Payer: Self-pay

## 2023-12-25 DIAGNOSIS — C50412 Malignant neoplasm of upper-outer quadrant of left female breast: Secondary | ICD-10-CM | POA: Diagnosis not present

## 2023-12-25 DIAGNOSIS — C50212 Malignant neoplasm of upper-inner quadrant of left female breast: Secondary | ICD-10-CM | POA: Diagnosis not present

## 2023-12-25 DIAGNOSIS — Z170 Estrogen receptor positive status [ER+]: Secondary | ICD-10-CM | POA: Diagnosis not present

## 2023-12-25 LAB — RAD ONC ARIA SESSION SUMMARY
Course Elapsed Days: 27
Plan Fractions Treated to Date: 4
Plan Prescribed Dose Per Fraction: 2 Gy
Plan Total Fractions Prescribed: 4
Plan Total Prescribed Dose: 8 Gy
Reference Point Dosage Given to Date: 8 Gy
Reference Point Session Dosage Given: 2 Gy
Session Number: 20

## 2023-12-26 NOTE — Radiation Completion Notes (Signed)
Patient Name: Misty Wilkerson, Misty Wilkerson MRN: [id number] Date of Birth: [birth year] Referring Physician: [name], M.D. Date of Service: 2023-12-26 Radiation Oncologist: Dorothy Puffer, M.D. [facility] -                              RADIATION ONCOLOGY END OF TREATMENT NOTE     Diagnosis: C50.412 Malignant neoplasm of upper-outer quadrant of left female breast Staging on 2013-12-15: Breast cancer of upper-inner quadrant of left female breast (HCC) T=T1a, N=N0, M=cM0 Staging on 2023-09-10: Breast cancer of upper-outer quadrant of left female breast (HCC) T=cT1, N=cN0, M=cM0 Intent: Curative     ==========DELIVERED PLANS==========  First Treatment Date: 2023-11-28 Last Treatment Date: 2023-12-25   Plan Name: Breast_L_BH Site: Breast, Left Technique: 3D Mode: Photon Dose Per Fraction: 2.66 Gy Prescribed Dose (Delivered / Prescribed): 42.56 Gy / 42.56 Gy Prescribed Fxs (Delivered / Prescribed): 16 / 16   Plan Name: Brst_L_Bst_BH Site: Breast, Left Technique: 3D Mode: Photon Dose Per Fraction: 2 Gy Prescribed Dose (Delivered / Prescribed): 8 Gy / 8 Gy Prescribed Fxs (Delivered / Prescribed): 4 / 4     ==========ON TREATMENT VISIT DATES========== 2023-11-29, 2023-12-06, 2023-12-13, 2023-12-20     ==========UPCOMING VISITS========== 02/19/2024 [facility] FOLLOW UP [address]  02/04/2024 [facility] VISIT [name], MD  01/28/2024 [facility] EST PT 15 Rachel Moulds, MD  01/28/2024 [facility] INFUSION 1HR30MIN (90) [facility] INFUSION  01/27/2024 [facility] POST TREATMENT CALL [facility]  01/07/2024 [facility] EST PT 15 Iruku, Burnice Logan, MD  01/07/2024 [facility] INFUSION 1HR30MIN (90) [facility] INFUSION        ==========APPENDIX - ON TREATMENT VISIT NOTES==========   See weekly On Treatment Notes in Epic for details in the Media tab (listed as Progress  notes on the On Treatment Visit Dates listed above).

## 2024-01-07 ENCOUNTER — Inpatient Hospital Stay: Payer: Medicare PPO | Attending: Hematology and Oncology

## 2024-01-07 ENCOUNTER — Encounter: Payer: Self-pay | Admitting: *Deleted

## 2024-01-07 ENCOUNTER — Inpatient Hospital Stay: Payer: Medicare PPO | Admitting: Hematology and Oncology

## 2024-01-07 VITALS — BP 153/55 | HR 72 | Temp 98.0°F | Resp 16 | Wt 165.7 lb

## 2024-01-07 DIAGNOSIS — Z170 Estrogen receptor positive status [ER+]: Secondary | ICD-10-CM

## 2024-01-07 DIAGNOSIS — Z5112 Encounter for antineoplastic immunotherapy: Secondary | ICD-10-CM | POA: Insufficient documentation

## 2024-01-07 DIAGNOSIS — C50412 Malignant neoplasm of upper-outer quadrant of left female breast: Secondary | ICD-10-CM | POA: Insufficient documentation

## 2024-01-07 DIAGNOSIS — C50212 Malignant neoplasm of upper-inner quadrant of left female breast: Secondary | ICD-10-CM | POA: Diagnosis not present

## 2024-01-07 MED ORDER — LETROZOLE 2.5 MG PO TABS
2.5000 mg | ORAL_TABLET | Freq: Every day | ORAL | 3 refills | Status: AC
Start: 2024-01-07 — End: ?

## 2024-01-07 MED ORDER — TRASTUZUMAB-ANNS CHEMO 150 MG IV SOLR
6.0000 mg/kg | Freq: Once | INTRAVENOUS | Status: AC
  Administered 2024-01-07: 420 mg via INTRAVENOUS
  Filled 2024-01-07: qty 20

## 2024-01-07 MED ORDER — ACETAMINOPHEN 325 MG PO TABS
650.0000 mg | ORAL_TABLET | Freq: Once | ORAL | Status: AC
  Administered 2024-01-07: 650 mg via ORAL
  Filled 2024-01-07: qty 2

## 2024-01-07 MED ORDER — SODIUM CHLORIDE 0.9 % IV SOLN: INTRAVENOUS | Status: DC

## 2024-01-07 MED ORDER — DIPHENHYDRAMINE HCL 25 MG PO CAPS
50.0000 mg | ORAL_CAPSULE | Freq: Once | ORAL | Status: AC
Start: 2024-01-07 — End: 2024-01-07
  Administered 2024-01-07: 50 mg via ORAL
  Filled 2024-01-07: qty 2

## 2024-01-07 NOTE — Patient Instructions (Signed)
[facility] - A DEPT OF [facility]  Discharge Instructions: Thank you for choosing [facility] to provide your oncology and hematology care.   If you have a lab appointment with the Cancer Center, please go directly to the Cancer Center and check in at the registration area.   Wear comfortable clothing and clothing appropriate for easy access to any Portacath or PICC line.   We strive to give you quality time with your provider. You may need to reschedule your appointment if you arrive late (15 or more minutes).  Arriving late affects you and other patients whose appointments are after yours.  Also, if you miss three or more appointments without notifying the office, you may be dismissed from the clinic at the provider's discretion.      For prescription refill requests, have your pharmacy contact our office and allow 72 hours for refills to be completed.    Today you received the following chemotherapy and/or immunotherapy agents: Kanjinti      To help prevent nausea and vomiting after your treatment, we encourage you to take your nausea medication as directed.  BELOW ARE SYMPTOMS THAT SHOULD BE REPORTED IMMEDIATELY: *FEVER GREATER THAN 100.4 F (38 C) OR HIGHER *CHILLS OR SWEATING *NAUSEA AND VOMITING THAT IS NOT CONTROLLED WITH YOUR NAUSEA MEDICATION *UNUSUAL SHORTNESS OF BREATH *UNUSUAL BRUISING OR BLEEDING *URINARY PROBLEMS (pain or burning when urinating, or frequent urination) *BOWEL PROBLEMS (unusual diarrhea, constipation, pain near the anus) TENDERNESS IN MOUTH AND THROAT WITH OR WITHOUT PRESENCE OF ULCERS (sore throat, sores in mouth, or a toothache) UNUSUAL RASH, SWELLING OR PAIN  UNUSUAL VAGINAL DISCHARGE OR ITCHING   Items with * indicate a potential emergency and should be followed up as soon as possible or go to the Emergency Department if any problems should occur.  Please show the CHEMOTHERAPY ALERT CARD or IMMUNOTHERAPY  ALERT CARD at check-in to the Emergency Department and triage nurse.  Should you have questions after your visit or need to cancel or reschedule your appointment, please contact [facility] - A DEPT OF [facility]  Dept: [phone]  and follow the prompts.  Office hours are 8:00 a.m. to 4:30 p.m. Monday - Friday. Please note that voicemails left after 4:00 p.m. may not be returned until the following business day.  We are closed weekends and major holidays. You have access to a nurse at all times for urgent questions. Please call the main number to the clinic Dept: [phone] and follow the prompts.   For any non-urgent questions, you may also contact your provider using MyChart. We now offer e-Visits for anyone 60 and older to request care online for non-urgent symptoms. For details visit [email or web address].   Also download the MyChart app! Go to the app store, search "MyChart", open the app, select [facility], and log in with your MyChart username and password.

## 2024-01-07 NOTE — Progress Notes (Signed)
 [facility] CONSULT NOTE  Patient Care Team: [name], MD as PCP - General (Family Medicine) Pershing Proud, RN as Oncology Nurse Navigator Donnelly Angelica, RN as Oncology Nurse Navigator  CHIEF COMPLAINTS/PURPOSE OF CONSULTATION:  Newly diagnosed breast cancer  HISTORY OF PRESENTING ILLNESS:  Misty Wilkerson 83 y.o. female is here because of recent diagnosis of left breast cancer  I reviewed her records extensively and collaborated the history with the patient.  SUMMARY OF ONCOLOGIC HISTORY: Oncology History  Breast cancer of upper-inner quadrant of left female breast (HCC)  12/08/1998 Initial Biopsy   Left breast cancer: Lumpectomy followed by radiation T1 BN 0M0 could not tolerate tamoxifen or alternative therapies.   08/12/2013 Surgery   Left breast lumpectomy: Tumor 1  invasive ductal carcinoma 0.4 cm with DCIS, tumor to: IDC 1.2 cm with DCIS positive margin, ER 98%, PR 14%, HER-2 negative, Ki-67 19% margins reexcision negative   09/07/2013 -  Anti-estrogen oral therapy   Letrozole 2.5 mg daily plans for 5 years   Breast cancer of upper-outer quadrant of left female breast (HCC)  09/10/2023 Initial Diagnosis   Breast cancer of upper-outer quadrant of left female breast (HCC)   09/10/2023 Cancer Staging   Staging form: Breast, AJCC 8th Edition - Clinical: Stage IA (cT1, cN0, cM0, G3, ER+, PR+, HER2+) - Signed by Rachel Moulds, MD on 09/10/2023 Stage prefix: Initial diagnosis Histologic grading system: 3 grade system   11/25/2023 -  Chemotherapy   Patient is on Treatment Plan : BREAST Trastuzumab IV (8/6) or SQ (600) D1 q21d     11/26/2023 - 11/26/2023 Chemotherapy   Patient is on Treatment Plan : BREAST Paclitaxel + Trastuzumab q7d / Trastuzumab q21d     11/26/2023 - 11/26/2023 Chemotherapy   Patient is on Treatment Plan : BREAST Trastuzumab SQ (600) D1 q21d      The patient, an 83 year old woman with a history of breast cancer, presents  with a new breast mass. This is her third occurrence of breast cancer, with previous diagnoses in 2000 and 2014.  Since her last visit here she had left breast lumpectomy, final pathology showed invasive ductal carcinoma measuring 1 x 0.8 x 0.5 cm, grade 2, DCIS focal solid type grade 2, negative margins for both invasive and DCIS.  All margins are negative for malignancy.  No lymph node specimen was submitted.  Prognostics from previous breast biopsy showed ER +100% strong PR +100% strong and HER2 positive by FISH with Ki-67 of 30%.   Discussed the use of AI scribe software for clinical note transcription with the patient, who gave verbal consent to proceed.  History of Present Illness    The patient, an 83 year old with a history of triple positive breast cancer, presents for a follow-up while on herceptin.  She completed radiation about 2.5 weeks ago, healing well except for fatigue. She is ok to go back on letrozole. She has no side effects related to herceptin. She denies any major issues today, she is very much into having a good quality of life, having fun with great grand kids. Rest of the pertinent 10 point ROS reviewed and neg.   MEDICAL HISTORY:  Past Medical History:  Diagnosis Date   Allergy    Arthritis    Cancer (HCC)    left breast   H/O hiatal hernia    eats slowly  now   Hypertension    Personal history of radiation therapy 2002   left breast   Vertigo  SURGICAL HISTORY: Past Surgical History:  Procedure Laterality Date   BREAST BIOPSY Left 08/29/2023   [place] LT BREAST BX W LOC DEV 1ST LESION IMG BX SPEC US GUIDE 08/29/2023 [facility]   BREAST BIOPSY Left 09/27/2023   [place] LT RADIOACTIVE SEED LOC 09/27/2023 [facility]   BREAST LUMPECTOMY Left 2002, 2014   BREAST LUMPECTOMY WITH RADIOACTIVE SEED LOCALIZATION Left 09/30/2023   Procedure: LEFT BREAST SEED GUIDED LUMPECTOMY;  Surgeon: [name], MD;  Location: [facility];  Service:  General;  Laterality: Left;  LMA   CHOLECYSTECTOMY     COLONOSCOPY N/A 10/26/2015   Procedure: COLONOSCOPY;  Surgeon: Malissa Hippo, MD;  Location: [facility];  Service: Endoscopy;  Laterality: N/A;  1200   DEBRIDEMENT AND CLOSURE WOUND Left 03/25/2018   Procedure: DEBRIDEMENTINTERPHALANGEAL LEFT INDEX FINGER;  Surgeon: Cindee Salt, MD;  Location: [facility];  Service: Orthopedics;  Laterality: Left;   EXCISION MASS UPPER EXTREMETIES Left 03/25/2018   Procedure: EXCISION MASS LEFT INDEX;  Surgeon: Cindee Salt, MD;  Location: [facility];  Service: Orthopedics;  Laterality: Left;   EYE SURGERY     bilateral cataracts   MAXILLARY ANTROSTOMY Left 12/25/2021   Procedure: LEFT MAXILLARY ANTROSTOMY WITH TISSUE REMOVAL;  Surgeon: Newman Pies, MD;  Location: [facility];  Service: ENT;  Laterality: Left;   PARTIAL MASTECTOMY WITH NEEDLE LOCALIZATION AND AXILLARY SENTINEL LYMPH NODE BX Left 08/12/2013   Procedure: LEFT PARTIAL MASTECTOMY WITH NEEDLE LOCALIZATION ATTEMPTED AXILLARY SENTINEL LYMPH NODE BIOPSY;  Surgeon: [name], MD;  Location: [facility];  Service: General;  Laterality: Left;   RE-EXCISION OF BREAST LUMPECTOMY Left 08/27/2013   Procedure: RE-EXCISION OF BREAST LUMPECTOMY;  Surgeon: [name], MD;  Location: [facility];  Service: General;  Laterality: Left;    SOCIAL HISTORY: Social History   Socioeconomic History   Marital status: Married    Spouse name: Kathlene November   Number of children: 2   Years of education: Not on file   Highest education level: Bachelor's degree (e.g., BA, AB, BS)  Occupational History   Occupation: retired  Tobacco Use   Smoking status: Former    Current packs/day: 0.00    Average packs/day: 0.5 packs/day for 15.0 years (7.5 ttl pk-yrs)    Types: Cigarettes    Start date: 07/21/1984    Quit date: 07/22/1999    Years since quitting: 24.4   Smokeless tobacco: Never  Vaping Use   Vaping status: Never Used   Substance and Sexual Activity   Alcohol use: Yes    Comment: one alcohol drink daily   Drug use: No   Sexual activity: Yes  Other Topics Concern   Not on file  Social History Narrative   Very independent and pretty active   Social Drivers of Corporate investment banker Strain: Low Risk  (12/11/2023)   Overall Financial Resource Strain (CARDIA)    Difficulty of Paying Living Expenses: Not hard at all  Food Insecurity: No Food Insecurity (12/11/2023)   Hunger Vital Sign    Worried About Running Out of Food in the Last Year: Never true    Ran Out of Food in the Last Year: Never true  Transportation Needs: No Transportation Needs (12/11/2023)   PRAPARE - Administrator, Civil Service (Medical): No    Lack of Transportation (Non-Medical): No  Physical Activity: Sufficiently Active (12/11/2023)   Exercise Vital Sign    Days of Exercise per Week: 5 days  Minutes of Exercise per Session: 30 min  Stress: No Stress Concern Present (12/11/2023)   [employer] of Occupational Health - Occupational Stress Questionnaire    Feeling of Stress : Only a little  Social Connections: Socially Integrated (12/11/2023)   Social Connection and Isolation Panel [NHANES]    Frequency of Communication with Friends and Family: More than three times a week    Frequency of Social Gatherings with Friends and Family: Once a week    Attends Religious Services: More than 4 times per year    Active Member of [employer] or Organizations: Yes    Attends Engineer, structural: More than 4 times per year    Marital Status: Married  Catering manager Violence: Not At Risk (12/11/2023)   Humiliation, Afraid, Rape, and Kick questionnaire    Fear of Current or Ex-Partner: No    Emotionally Abused: No    Physically Abused: No    Sexually Abused: No    FAMILY HISTORY: Family History  Problem Relation Age of Onset   Hypertension Mother    Diabetes Father    Stroke Father    Breast cancer Sister 38    Breast cancer Sister    Aneurysm Sister    Cancer Brother    Heart disease Brother     ALLERGIES:  is allergic to penicillins and tetracyclines & related.  MEDICATIONS:  Current Outpatient Medications  Medication Sig Dispense Refill   letrozole (FEMARA) 2.5 MG tablet Take 1 tablet (2.5 mg total) by mouth daily. 90 tablet 3   cholecalciferol (VITAMIN D3) 25 MCG (1000 UNIT) tablet Take 2 tablets (2,000 Units total) by mouth daily.     Multiple Vitamin (MULTIVITAMIN) tablet Take 1 tablet by mouth daily.     triamterene-hydrochlorothiazide (MAXZIDE-25) 37.5-25 MG tablet Take 0.5 tablets by mouth daily. 45 tablet 3   valsartan (DIOVAN) 160 MG tablet TAKE 1 TABLET BY MOUTH DAILY FOR BLOOD PRESSURE 90 tablet 0   vitamin B-12 (CYANOCOBALAMIN) 1000 MCG tablet Take 1,000 mcg by mouth daily.     No current facility-administered medications for this visit.   Facility-Administered Medications Ordered in Other Visits  Medication Dose Route Frequency Provider Last Rate Last Admin   0.9 %  sodium chloride infusion   Intravenous Continuous Franshesca Chipman, Burnice Logan, MD 10 mL/hr at 01/07/24 1503 New Bag at 01/07/24 1503     PHYSICAL EXAMINATION: ECOG PERFORMANCE STATUS: 0 - Asymptomatic  Vitals:   01/07/24 1408  BP: (!) 153/55  Pulse: 72  Resp: 16  Temp: 98 F (36.7 C)  SpO2: 100%     Filed Weights   01/07/24 1408  Weight: 165 lb 11.2 oz (75.2 kg)   GENERAL:alert, no distress and comfortable Chest: Clear to auscultation bilaterally Heart: Rate and rhythm regular Abdomen: Soft, nontender, nondistended No lower extremity edema  LABORATORY DATA:  I have reviewed the data as listed Lab Results  Component Value Date   WBC 6.0 08/06/2023   HGB 13.6 08/06/2023   HCT 41.8 08/06/2023   MCV 97 08/06/2023   PLT 239 08/06/2023   Lab Results  Component Value Date   NA 135 09/24/2023   K 4.6 09/24/2023   CL 105 09/24/2023   CO2 21 (L) 09/24/2023    RADIOGRAPHIC STUDIES: I have  personally reviewed the radiological reports and agreed with the findings in the report.  ASSESSMENT AND PLAN:  Breast cancer of upper-inner quadrant of left female breast This is a 83 year old female patient with past medical history significant for  left breast cancer in 2000, 2014 now presents with new onset left breast invasive ductal carcinoma, grade 3, ER/PR and HER2 amplified.  Patient has followed up with Dr. Dwain Sarna and the recommendation was to consider mastectomy given the HER2 amplified breast cancer but patient is strongly opposed to it.  She is not willing to consider mastectomy especially because she is worried about her quality of life.  She is now status post lumpectomy, final pathology showed grade 2 IDC measuring 1 x 0.8 x 0.5 cm, negative margins, no sentinel lymph nodes submitted. She did not want to consider adjuvant chemotherapy hence have discussed about subcutaneous/IV Herceptin every 3 weeks for a whole year along with antiestrogen therapy once she completes radiation.  Triple Positive Breast Cancer Completed radiation therapy, tolerated it very well.Receiving Herceptin with no reported side effects. Patient is fatigued, likely secondary to radiation therapy. -Continue Herceptin until completion before Christmas 2025. -Resume Letrozole at next visit, she tolerated this very well in the past. -Check cardiac function every three months due to Herceptin use, next check in March 2025. - No concerns on exam. We will see her at her next visit.  Total time: 20 min       All questions were answered. The patient knows to call the clinic with any problems, questions or concerns.    Rachel Moulds, MD 01/07/24

## 2024-01-07 NOTE — Assessment & Plan Note (Signed)
This is a 84 year old female patient with past medical history significant for left breast cancer in 2000, 2014 now presents with new onset left breast invasive ductal carcinoma, grade 3, ER/PR and HER2 amplified.  Patient has followed up with Dr. Dwain Sarna and the recommendation was to consider mastectomy given the HER2 amplified breast cancer but patient is strongly opposed to it.  She is not willing to consider mastectomy especially because she is worried about her quality of life.  She is now status post lumpectomy, final pathology showed grade 2 IDC measuring 1 x 0.8 x 0.5 cm, negative margins, no sentinel lymph nodes submitted. She did not want to consider adjuvant chemotherapy hence have discussed about subcutaneous/IV Herceptin every 3 weeks for a whole year along with antiestrogen therapy once she completes radiation.  Triple Positive Breast Cancer Completed radiation therapy, tolerated it very well.Receiving Herceptin with no reported side effects. Patient is fatigued, likely secondary to radiation therapy. -Continue Herceptin until completion before Christmas 2025. -Resume Letrozole at next visit, she tolerated this very well in the past. -Check cardiac function every three months due to Herceptin use, next check in March 2025. - No concerns on exam. We will see her at her next visit.  Total time: 20 min

## 2024-01-19 ENCOUNTER — Other Ambulatory Visit: Payer: Self-pay | Admitting: Family Medicine

## 2024-01-21 ENCOUNTER — Ambulatory Visit: Payer: Medicare PPO

## 2024-01-21 ENCOUNTER — Ambulatory Visit: Payer: Medicare PPO | Admitting: Family Medicine

## 2024-01-21 VITALS — BP 187/77 | HR 77 | Temp 99.8°F | Ht 63.0 in | Wt 161.6 lb

## 2024-01-21 DIAGNOSIS — J069 Acute upper respiratory infection, unspecified: Secondary | ICD-10-CM | POA: Diagnosis not present

## 2024-01-21 DIAGNOSIS — R5383 Other fatigue: Secondary | ICD-10-CM | POA: Diagnosis not present

## 2024-01-21 DIAGNOSIS — R5381 Other malaise: Secondary | ICD-10-CM | POA: Diagnosis not present

## 2024-01-21 MED ORDER — BENZONATATE 100 MG PO CAPS: 100.0000 mg | ORAL_CAPSULE | Freq: Three times a day (TID) | ORAL | 0 refills | Status: DC | PRN

## 2024-01-21 MED ORDER — GUAIFENESIN ER 600 MG PO TB12: 600.0000 mg | ORAL_TABLET | Freq: Two times a day (BID) | ORAL | 0 refills | Status: AC

## 2024-01-21 MED ORDER — AZITHROMYCIN 500 MG PO TABS: 500.0000 mg | ORAL_TABLET | Freq: Every day | ORAL | 0 refills | Status: AC

## 2024-01-21 NOTE — Progress Notes (Signed)
 Subjective:  Patient ID: Misty Wilkerson, female    DOB: Mar 31, 1941, 83 y.o.   MRN: [id number]  Patient Care Team: [name], MD as PCP - General (Family Medicine) Pershing Proud, RN as Oncology Nurse Navigator Donnelly Angelica, RN as Oncology Nurse Navigator   Chief Complaint:  Cough, Sore Throat, and Nasal Congestion (X 1 week- otc mucinex)   HPI: Misty Wilkerson is a 83 y.o. female presenting on 01/21/2024 for Cough, Sore Throat, and Nasal Congestion (X 1 week- otc mucinex)   Discussed the use of AI scribe software for clinical note transcription with the patient, who gave verbal consent to proceed.  History of Present Illness   Misty Wilkerson is an 83 year old female who presents with a cough and malaise.  She has been experiencing a cough for the past week, accompanied by hoarseness, malaise, and fatigue. The symptoms began with sneezing and coughing. Despite having had cancer three times, she has never felt this unwell before. She is unsure if she has had any fevers accompanying her symptoms. She suspects she may have contracted the illness from her preacher, and her husband also developed similar symptoms and was treated with prednisone, which did not alleviate his cough.  She is currently undergoing treatment with Herceptin infusions every three weeks, with the last infusion on January 07, 2024, and the next scheduled for January 22, 2024. She mentions feeling excellent after her infusions, although she was a little tired following a round of radiation. She reports no known fevers and is unsure about any recent changes in her white blood cell count as she is not undergoing chemotherapy but is receiving Herceptin infusions.  She is allergic to tetracycline, which causes itching and wheezing. She is not aware of any issues with doxycycline but is able to take Zithromax.  She has a history of being active and rarely sick, stating that in her 82 years, she can count  on her fingers the number of times she has been ill. She feels her age due to the current illness.          Relevant past medical, surgical, family, and social history reviewed and updated as indicated.  Allergies and medications reviewed and updated. Data reviewed: Chart in Epic.   Past Medical History:  Diagnosis Date   Allergy    Arthritis    Cancer (HCC)    left breast   H/O hiatal hernia    eats slowly  now   Hypertension    Personal history of radiation therapy 2002   left breast   Vertigo     Past Surgical History:  Procedure Laterality Date   BREAST BIOPSY Left 08/29/2023   [place] LT BREAST BX W LOC DEV 1ST LESION IMG BX SPEC US GUIDE 08/29/2023 [facility]   BREAST BIOPSY Left 09/27/2023   [place] LT RADIOACTIVE SEED LOC 09/27/2023 [facility]   BREAST LUMPECTOMY Left 2002, 2014   BREAST LUMPECTOMY WITH RADIOACTIVE SEED LOCALIZATION Left 09/30/2023   Procedure: LEFT BREAST SEED GUIDED LUMPECTOMY;  Surgeon: [name], MD;  Location: [facility];  Service: General;  Laterality: Left;  LMA   CHOLECYSTECTOMY     COLONOSCOPY N/A 10/26/2015   Procedure: COLONOSCOPY;  Surgeon: Malissa Hippo, MD;  Location[facility];  Service: Endoscopy;  Laterality: N/A;  1200   DEBRIDEMENT AND CLOSURE WOUND Left 03/25/2018   Procedure: DEBRIDEMENTINTERPHALANGEAL LEFT INDEX FINGER;  Surgeon: Cindee Salt, MD;  Location: [facility];  Service: Orthopedics;  Laterality: Left;   EXCISION MASS UPPER EXTREMETIES Left 03/25/2018   Procedure: EXCISION MASS LEFT INDEX;  Surgeon: Cindee Salt, MD;  Location: [facility];  Service: Orthopedics;  Laterality: Left;   EYE SURGERY     bilateral cataracts   MAXILLARY ANTROSTOMY Left 12/25/2021   Procedure: LEFT MAXILLARY ANTROSTOMY WITH TISSUE REMOVAL;  Surgeon: Newman Pies, MD;  Location: [facility];  Service: ENT;  Laterality: Left;   PARTIAL MASTECTOMY WITH NEEDLE LOCALIZATION AND  AXILLARY SENTINEL LYMPH NODE BX Left 08/12/2013   Procedure: LEFT PARTIAL MASTECTOMY WITH NEEDLE LOCALIZATION ATTEMPTED AXILLARY SENTINEL LYMPH NODE BIOPSY;  Surgeon: [name], MD;  Location: [facility];  Service: General;  Laterality: Left;   RE-EXCISION OF BREAST LUMPECTOMY Left 08/27/2013   Procedure: RE-EXCISION OF BREAST LUMPECTOMY;  Surgeon: [name], MD;  Location: [facility];  Service: General;  Laterality: Left;    Social History   Socioeconomic History   Marital status: Married    Spouse name: Kathlene November   Number of children: 2   Years of education: Not on file   Highest education level: Bachelor's degree (e.g., BA, AB, BS)  Occupational History   Occupation: retired  Tobacco Use   Smoking status: Former    Current packs/day: 0.00    Average packs/day: 0.5 packs/day for 15.0 years (7.5 ttl pk-yrs)    Types: Cigarettes    Start date: 07/21/1984    Quit date: 07/22/1999    Years since quitting: 24.5   Smokeless tobacco: Never  Vaping Use   Vaping status: Never Used  Substance and Sexual Activity   Alcohol use: Yes    Comment: one alcohol drink daily   Drug use: No   Sexual activity: Yes  Other Topics Concern   Not on file  Social History Narrative   Very independent and pretty active   Social Drivers of Corporate investment banker Strain: Low Risk  (01/21/2024)   Overall Financial Resource Strain (CARDIA)    Difficulty of Paying Living Expenses: Not hard at all  Food Insecurity: No Food Insecurity (01/21/2024)   Hunger Vital Sign    Worried About Running Out of Food in the Last Year: Never true    Ran Out of Food in the Last Year: Never true  Transportation Needs: No Transportation Needs (01/21/2024)   PRAPARE - Administrator, Civil Service (Medical): No    Lack of Transportation (Non-Medical): No  Physical Activity: Sufficiently Active (01/21/2024)   Exercise Vital Sign    Days of Exercise per Week: 5 days    Minutes of Exercise per Session: 30 min   Stress: No Stress Concern Present (01/21/2024)   [employer] of Occupational Health - Occupational Stress Questionnaire    Feeling of Stress : Not at all  Social Connections: Socially Integrated (01/21/2024)   Social Connection and Isolation Panel [NHANES]    Frequency of Communication with Friends and Family: More than three times a week    Frequency of Social Gatherings with Friends and Family: Twice a week    Attends Religious Services: More than 4 times per year    Active Member of [employer] or Organizations: Yes    Attends Banker Meetings: 1 to 4 times per year    Marital Status: Married  Catering manager Violence: Not At Risk (12/11/2023)   Humiliation, Afraid, Rape, and Kick questionnaire    Fear of Current or Ex-Partner: No    Emotionally Abused: No  Physically Abused: No    Sexually Abused: No    Outpatient Encounter Medications as of 01/21/2024  Medication Sig   azithromycin (ZITHROMAX) 500 MG tablet Take 1 tablet (500 mg total) by mouth daily for 3 days.   benzonatate (TESSALON PERLES) 100 MG capsule Take 1 capsule (100 mg total) by mouth 3 (three) times daily as needed for cough.   cholecalciferol (VITAMIN D3) 25 MCG (1000 UNIT) tablet Take 2 tablets (2,000 Units total) by mouth daily.   guaiFENesin (MUCINEX) 600 MG 12 hr tablet Take 1 tablet (600 mg total) by mouth 2 (two) times daily for 10 days.   letrozole (FEMARA) 2.5 MG tablet Take 1 tablet (2.5 mg total) by mouth daily.   Multiple Vitamin (MULTIVITAMIN) tablet Take 1 tablet by mouth daily.   triamterene-hydrochlorothiazide (MAXZIDE-25) 37.5-25 MG tablet Take 0.5 tablets by mouth daily.   valsartan (DIOVAN) 160 MG tablet Take 1 tablet (160 mg total) by mouth daily. for blood pressure **NEEDS TO BE SEEN BEFORE NEXT REFILL**   vitamin B-12 (CYANOCOBALAMIN) 1000 MCG tablet Take 1,000 mcg by mouth daily.   No facility-administered encounter medications on file as of 01/21/2024.    Allergies  Allergen  Reactions   Penicillins Shortness Of Breath and Rash   Tetracyclines & Related Itching    Pertinent ROS per HPI, otherwise unremarkable      Objective:  BP (!) 187/77   Pulse 77   Temp 99.8 F (37.7 C)   Ht 5\' 3"  (1.6 m)   Wt 161 lb 9.6 oz (73.3 kg)   SpO2 99%   BMI 28.63 kg/m    Wt Readings from Last 3 Encounters:  01/21/24 161 lb 9.6 oz (73.3 kg)  01/07/24 165 lb 11.2 oz (75.2 kg)  12/17/23 162 lb 1.6 oz (73.5 kg)    Physical Exam Vitals and nursing note reviewed.  Constitutional:      General: She is not in acute distress.    Appearance: Normal appearance. She is well-developed. She is not ill-appearing, toxic-appearing or diaphoretic.  HENT:     Head: Normocephalic and atraumatic.     Right Ear: Tympanic membrane, ear canal and external ear normal.     Left Ear: Tympanic membrane, ear canal and external ear normal.     Nose: Congestion present.     Mouth/Throat:     Mouth: Mucous membranes are moist.     Pharynx: Posterior oropharyngeal erythema and postnasal drip present.  Eyes:     Conjunctiva/sclera: Conjunctivae normal.     Pupils: Pupils are equal, round, and reactive to light.  Cardiovascular:     Rate and Rhythm: Normal rate and regular rhythm.     Heart sounds: Normal heart sounds.  Pulmonary:     Effort: Pulmonary effort is normal.     Breath sounds: Normal breath sounds.     Comments: Congested cough Musculoskeletal:     Cervical back: Neck supple.  Skin:    General: Skin is warm and dry.     Capillary Refill: Capillary refill takes less than 2 seconds.  Neurological:     General: No focal deficit present.     Mental Status: She is alert and oriented to person, place, and time.  Psychiatric:        Mood and Affect: Mood normal.        Thought Content: Thought content normal.        Judgment: Judgment normal.      Results for orders placed or performed in  visit on 12/25/23  Rad Onc [facility] Session Summary   Collection Time: 12/25/23 12:53  PM  Result Value Ref Range   Course ID [email or web address]    Course Start Date 11/12/2023    Session Number 20    Course First Treatment Date 11/28/2023  1:07 PM    Course Last Treatment Date 12/25/2023 12:52 PM    Course Elapsed Days 27    Reference Point ID Brst_L_Bst dp    Reference Point Dosage Given to Date 8 Gy   Reference Point Session Dosage Given 2 Gy   Plan ID [email or web address]_BH    Plan Name Brst_[email or web address]    Plan Fractions Treated to Date 4    Plan Total Fractions Prescribed 4    Plan Prescribed Dose Per Fraction 2 Gy   Plan Total Prescribed Dose 8.000000 Gy   Plan Primary Reference Point Brst_L_Bst dp        Pertinent labs & imaging results that were available during my care of the patient were reviewed by me and considered in my medical decision making.  Assessment & Plan:  Samanta was seen today for cough, sore throat and nasal congestion.  Diagnoses and all orders for this visit:  URI with cough and congestion -     guaiFENesin (MUCINEX) 600 MG 12 hr tablet; Take 1 tablet (600 mg total) by mouth 2 (two) times daily for 10 days. -     azithromycin (ZITHROMAX) 500 MG tablet; Take 1 tablet (500 mg total) by mouth daily for 3 days. -     benzonatate (TESSALON PERLES) 100 MG capsule; Take 1 capsule (100 mg total) by mouth 3 (three) times daily as needed for cough.  Malaise and fatigue -     CMP14+EGFR -     CBC with Differential/Platelet     Assessment and Plan    Upper Respiratory Infection Acute upper respiratory infection with cough, hoarseness, malaise, and fatigue for one week. No fever. Lungs clear on examination, suggesting infection is confined to the upper respiratory tract. Given her cancer history and Herceptin treatment, monitoring for complications is necessary. Discussed Zithromax, Mucinex, and Tessalon Perles. Zithromax 500 mg daily for 3 days has shown good outcomes. Mucinex twice daily with water for mucus clearance. Tessalon Perles for cough relief. - Prescribe  Zithromax 500 mg daily for 3 days - Prescribe Mucinex twice daily with water - Prescribe Tessalon Perles as needed for cough - Order CBC and kidney function tests  Cancer Undergoing Herceptin infusions every three weeks. Last infusion on the 11th, next on Tuesday. Regular monitoring of renal and liver functions due to Herceptin's potential side effects. - Continue Herceptin infusions as scheduled - Monitor renal and liver function regularly  General Health Maintenance 83 year old with good health record. Encouraged to maintain physical activity, including bicycling, as tolerated. - Encourage continued physical activity, including bicycling  Follow-up - Follow up with ENT on the 26th - Lab work today Engineer, maintenance (IT) healthcare provider if symptoms worsen or new symptoms develop.          Continue all other maintenance medications.  Follow up plan: Return if symptoms worsen or fail to improve.   Continue healthy lifestyle choices, including diet (rich in fruits, vegetables, and lean proteins, and low in salt and simple carbohydrates) and exercise (at least 30 minutes of moderate physical activity daily).  Educational handout given for URI  The above assessment and management plan was discussed with the patient. The patient verbalized understanding of and has agreed to the  management plan. Patient is aware to call the clinic if they develop any new symptoms or if symptoms persist or worsen. Patient is aware when to return to the clinic for a follow-up visit. Patient educated on when it is appropriate to go to the emergency department.   Kari Baars, FNP-C [facility] [phone]

## 2024-01-22 ENCOUNTER — Encounter: Payer: Self-pay | Admitting: Hematology and Oncology

## 2024-01-22 LAB — CMP14+EGFR
ALT: 20 IU/L (ref 0–32)
AST: 23 IU/L (ref 0–40)
Albumin: 4.4 g/dL (ref 3.7–4.7)
Alkaline Phosphatase: 73 IU/L (ref 44–121)
BUN/Creatinine Ratio: 7 — ABNORMAL LOW (ref 12–28)
BUN: 8 mg/dL (ref 8–27)
Bilirubin Total: 0.4 mg/dL (ref 0.0–1.2)
CO2: 22 mmol/L (ref 20–29)
Calcium: 9.3 mg/dL (ref 8.7–10.3)
Chloride: 100 mmol/L (ref 96–106)
Creatinine, Ser: 1.09 mg/dL — ABNORMAL HIGH (ref 0.57–1.00)
Globulin, Total: 2.8 g/dL (ref 1.5–4.5)
Glucose: 93 mg/dL (ref 70–99)
Potassium: 4.3 mmol/L (ref 3.5–5.2)
Sodium: 138 mmol/L (ref 134–144)
Total Protein: 7.2 g/dL (ref 6.0–8.5)
eGFR: 51 mL/min/{1.73_m2} — ABNORMAL LOW (ref >59)

## 2024-01-22 LAB — CBC WITH DIFFERENTIAL/PLATELET
Basophils Absolute: 0 10*3/uL (ref 0.0–0.2)
Basos: 0 %
EOS (ABSOLUTE): 0.2 10*3/uL (ref 0.0–0.4)
Eos: 4 %
Hematocrit: 37.1 % (ref 34.0–46.6)
Hemoglobin: 12 g/dL (ref 11.1–15.9)
Immature Grans (Abs): 0 10*3/uL (ref 0.0–0.1)
Immature Granulocytes: 0 %
Lymphocytes Absolute: 0.6 10*3/uL — ABNORMAL LOW (ref 0.7–3.1)
Lymphs: 18 %
MCH: 30.1 pg (ref 26.6–33.0)
MCHC: 32.3 g/dL (ref 31.5–35.7)
MCV: 93 fL (ref 79–97)
Monocytes Absolute: 0.4 10*3/uL (ref 0.1–0.9)
Monocytes: 11 %
Neutrophils Absolute: 2.3 10*3/uL (ref 1.4–7.0)
Neutrophils: 67 %
Platelets: 198 10*3/uL (ref 150–450)
RBC: 3.99 x10E6/uL (ref 3.77–5.28)
RDW: 11.8 % (ref 11.7–15.4)
WBC: 3.5 10*3/uL (ref 3.4–10.8)

## 2024-01-27 ENCOUNTER — Other Ambulatory Visit (HOSPITAL_COMMUNITY): Payer: Medicare PPO

## 2024-01-27 ENCOUNTER — Ambulatory Visit
Admission: RE | Admit: 2024-01-27 | Discharge: 2024-01-27 | Disposition: A | Discharge status: Home / Self Care | Payer: Medicare PPO | Source: Ambulatory Visit | Attending: Internal Medicine | Admitting: Internal Medicine

## 2024-01-27 NOTE — Progress Notes (Signed)
  Radiation Oncology         [phone] ________________________________  Name: Misty Wilkerson MRN: [id number]  Date of Service: 01/27/2024  DOB: [birth year]  Post Treatment Telephone Note  Diagnosis:  Stage IA, pT1b, cN0M0, grade 2 triple positive invasive ductal carcinoma of the left breast in the setting of ER/ PR positive breast left cancer in 2014 as well as ER positive cancer in 2000 treated with lumpectomy and radiation (as documented in provider EOT note)  The patient was available for call today.   Symptoms of fatigue have improved since completing therapy.  Symptoms of skin changes have improved since completing therapy.  The patient was encouraged to avoid sun exposure in the area of prior treatment for up to one year following radiation with either sunscreen or by the style of clothing worn in the sun.  The patient has scheduled follow up with her medical oncologist Dr. Al Pimple for ongoing surveillance, and was encouraged to call if she develops concerns or questions regarding radiation.   This concludes the interaction.  Ruel Favors, LPN

## 2024-01-28 ENCOUNTER — Inpatient Hospital Stay: Payer: Medicare PPO | Admitting: Hematology and Oncology

## 2024-01-28 ENCOUNTER — Inpatient Hospital Stay: Payer: Medicare PPO | Attending: Hematology and Oncology

## 2024-01-28 VITALS — BP 147/52 | HR 78 | Temp 98.2°F | Resp 18 | Ht 63.0 in | Wt 164.0 lb

## 2024-01-28 DIAGNOSIS — Z170 Estrogen receptor positive status [ER+]: Secondary | ICD-10-CM

## 2024-01-28 DIAGNOSIS — C50212 Malignant neoplasm of upper-inner quadrant of left female breast: Secondary | ICD-10-CM

## 2024-01-28 DIAGNOSIS — R197 Diarrhea, unspecified: Secondary | ICD-10-CM | POA: Diagnosis not present

## 2024-01-28 DIAGNOSIS — Z79811 Long term (current) use of aromatase inhibitors: Secondary | ICD-10-CM | POA: Diagnosis not present

## 2024-01-28 DIAGNOSIS — J329 Chronic sinusitis, unspecified: Secondary | ICD-10-CM | POA: Diagnosis not present

## 2024-01-28 DIAGNOSIS — C50412 Malignant neoplasm of upper-outer quadrant of left female breast: Secondary | ICD-10-CM | POA: Diagnosis not present

## 2024-01-28 DIAGNOSIS — Z5112 Encounter for antineoplastic immunotherapy: Secondary | ICD-10-CM | POA: Diagnosis not present

## 2024-01-28 MED ORDER — TRASTUZUMAB-ANNS CHEMO 150 MG IV SOLR
6.0000 mg/kg | Freq: Once | INTRAVENOUS | Status: AC
  Administered 2024-01-28: 420 mg via INTRAVENOUS
  Filled 2024-01-28: qty 20

## 2024-01-28 MED ORDER — DIPHENHYDRAMINE HCL 25 MG PO CAPS
50.0000 mg | ORAL_CAPSULE | Freq: Once | ORAL | Status: AC
  Administered 2024-01-28: 50 mg via ORAL
  Filled 2024-01-28: qty 2

## 2024-01-28 MED ORDER — ACETAMINOPHEN 325 MG PO TABS
650.0000 mg | ORAL_TABLET | Freq: Once | ORAL | Status: AC
  Administered 2024-01-28: 650 mg via ORAL
  Filled 2024-01-28: qty 2

## 2024-01-28 MED ORDER — SODIUM CHLORIDE 0.9 % IV SOLN
INTRAVENOUS | Status: DC
Start: 2024-01-28 — End: 2024-01-28

## 2024-01-28 NOTE — Assessment & Plan Note (Signed)
 This is a 83 year old female patient with past medical history significant for left breast cancer in 2000, 2014 now presents with new onset left breast invasive ductal carcinoma, grade 3, ER/PR and HER2 amplified.  Patient has followed up with Dr. Dwain Sarna and the recommendation was to consider mastectomy given the HER2 amplified breast cancer but patient is strongly opposed to it.  She is not willing to consider mastectomy especially because she is worried about her quality of life.  She is now status post lumpectomy, final pathology showed grade 2 IDC measuring 1 x 0.8 x 0.5 cm, negative margins, no sentinel lymph nodes submitted. She did not want to consider adjuvant chemotherapy hence have discussed about subcutaneous/IV Herceptin every 3 weeks for a whole year along with antiestrogen therapy once she completes radiation.  Triple Positive Breast Cancer Completed radiation therapy, tolerated it very well.Receiving Herceptin with no reported side effects. Tolerating Letrozole well, No significant side effects reported. Continue Letrozole and infusions as planned. Schedule echocardiogram every three months, next one due day after tomorrow.  Sinusitis Recently treated with Z-Pak by another physician, symptoms resolved. -No further action required at this time.  Gastrointestinal Disturbance Recent diarrhea likely secondary to Z-Pak, resolved with probiotics and diet modification. -Continue probiotics and diet modification as needed.  Follow-up Patient is doing well, can extend visits to every other infusion. -Next visit on March 10, 2024.

## 2024-01-28 NOTE — Patient Instructions (Addendum)
 [facility] - A DEPT OF [facility]  Discharge Instructions: Thank you for choosing [facility] to provide your oncology and hematology care.   If you have a lab appointment with the Cancer Center, please go directly to the Cancer Center and check in at the registration area.   Wear comfortable clothing and clothing appropriate for easy access to any Portacath or PICC line.   We strive to give you quality time with your provider. You may need to reschedule your appointment if you arrive late (15 or more minutes).  Arriving late affects you and other patients whose appointments are after yours.  Also, if you miss three or more appointments without notifying the office, you may be dismissed from the clinic at the provider's discretion.      For prescription refill requests, have your pharmacy contact our office and allow 72 hours for refills to be completed.    Today you received the following chemotherapy and/or immunotherapy agents: Kanjinti      To help prevent nausea and vomiting after your treatment, we encourage you to take your nausea medication as directed.  BELOW ARE SYMPTOMS THAT SHOULD BE REPORTED IMMEDIATELY: *FEVER GREATER THAN 100.4 F (38 C) OR HIGHER *CHILLS OR SWEATING *NAUSEA AND VOMITING THAT IS NOT CONTROLLED WITH YOUR NAUSEA MEDICATION *UNUSUAL SHORTNESS OF BREATH *UNUSUAL BRUISING OR BLEEDING *URINARY PROBLEMS (pain or burning when urinating, or frequent urination) *BOWEL PROBLEMS (unusual diarrhea, constipation, pain near the anus) TENDERNESS IN MOUTH AND THROAT WITH OR WITHOUT PRESENCE OF ULCERS (sore throat, sores in mouth, or a toothache) UNUSUAL RASH, SWELLING OR PAIN  UNUSUAL VAGINAL DISCHARGE OR ITCHING   Items with * indicate a potential emergency and should be followed up as soon as possible or go to the Emergency Department if any problems should occur.  Please show the CHEMOTHERAPY ALERT CARD or IMMUNOTHERAPY  ALERT CARD at check-in to the Emergency Department and triage nurse.  Should you have questions after your visit or need to cancel or reschedule your appointment, please contact [facility] - A DEPT OF [facility]  Dept: [phone]  and follow the prompts.  Office hours are 8:00 a.m. to 4:30 p.m. Monday - Friday. Please note that voicemails left after 4:00 p.m. may not be returned until the following business day.  We are closed weekends and major holidays. You have access to a nurse at all times for urgent questions. Please call the main number to the clinic Dept: [phone] and follow the prompts.   For any non-urgent questions, you may also contact your provider using MyChart. We now offer e-Visits for anyone 60 and older to request care online for non-urgent symptoms. For details visit [email or web address].   Also download the MyChart app! Go to the app store, search "MyChart", open the app, select [facility], and log in with your MyChart username and password.

## 2024-01-28 NOTE — Progress Notes (Signed)
 [facility] CONSULT NOTE  Patient Care Team: [name], MD as PCP - General (Family Medicine) Pershing Proud, RN as Oncology Nurse Navigator Donnelly Angelica, RN as Oncology Nurse Navigator  CHIEF COMPLAINTS/PURPOSE OF CONSULTATION:  Newly diagnosed breast cancer  HISTORY OF PRESENTING ILLNESS:  Misty Wilkerson 83 y.o. female is here because of recent diagnosis of left breast cancer  I reviewed her records extensively and collaborated the history with the patient.  SUMMARY OF ONCOLOGIC HISTORY: Oncology History  Breast cancer of upper-inner quadrant of left female breast (HCC)  12/08/1998 Initial Biopsy   Left breast cancer: Lumpectomy followed by radiation T1 BN 0M0 could not tolerate tamoxifen or alternative therapies.   08/12/2013 Surgery   Left breast lumpectomy: Tumor 1  invasive ductal carcinoma 0.4 cm with DCIS, tumor to: IDC 1.2 cm with DCIS positive margin, ER 98%, PR 14%, HER-2 negative, Ki-67 19% margins reexcision negative   09/07/2013 -  Anti-estrogen oral therapy   Letrozole 2.5 mg daily plans for 5 years   Breast cancer of upper-outer quadrant of left female breast (HCC)  09/10/2023 Initial Diagnosis   Breast cancer of upper-outer quadrant of left female breast (HCC)   09/10/2023 Cancer Staging   Staging form: Breast, AJCC 8th Edition - Clinical: Stage IA (cT1, cN0, cM0, G3, ER+, PR+, HER2+) - Signed by Rachel Moulds, MD on 09/10/2023 Stage prefix: Initial diagnosis Histologic grading system: 3 grade system   11/25/2023 -  Chemotherapy   Patient is on Treatment Plan : BREAST Trastuzumab IV (8/6) or SQ (600) D1 q21d     11/26/2023 - 11/26/2023 Chemotherapy   Patient is on Treatment Plan : BREAST Paclitaxel + Trastuzumab q7d / Trastuzumab q21d     11/26/2023 - 11/26/2023 Chemotherapy   Patient is on Treatment Plan : BREAST Trastuzumab SQ (600) D1 q21d      The patient, an 83 year old woman with a history of breast cancer, presents  with a new breast mass. This is her third occurrence of breast cancer, with previous diagnoses in 2000 and 2014.  Since her last visit here she had left breast lumpectomy, final pathology showed invasive ductal carcinoma measuring 1 x 0.8 x 0.5 cm, grade 2, DCIS focal solid type grade 2, negative margins for both invasive and DCIS.  All margins are negative for malignancy.  No lymph node specimen was submitted.  Prognostics from previous breast biopsy showed ER +100% strong PR +100% strong and HER2 positive by FISH with Ki-67 of 30%.   Discussed the use of AI scribe software for clinical note transcription with the patient, who gave verbal consent to proceed.  History of Present Illness    The patient, an 83 year old with a history of triple positive breast cancer, presents for a follow-up while on herceptin and letrozole.  She is currently undergoing herceptin only and is here for her fourth infusion out of a planned seventeen. She tolerates the treatment well without significant issues and is taking letrozole, which she describes as a 'piece of cake'.  She has experienced nasal congestion recently and visited a doctor who prescribed a Z-Pak, which she states 'always knocks it out'. She suspects the dry air in her house, with humidity levels between eighteen to twenty-two percent, may be contributing to her symptoms. She uses a humidifier in her bedroom while sleeping.  She experienced diarrhea for three days after taking the Z-Pak but managed it with probiotics and yogurt. No issues with bowel movements or urination currently.  Rest  of the pertinent 10 point ROS reviewed and neg.   MEDICAL HISTORY:  Past Medical History:  Diagnosis Date   Allergy    Arthritis    Cancer (HCC)    left breast   H/O hiatal hernia    eats slowly  now   Hypertension    Personal history of radiation therapy 2002   left breast   Vertigo     SURGICAL HISTORY: Past Surgical History:  Procedure Laterality  Date   BREAST BIOPSY Left 08/29/2023   [place] LT BREAST BX W LOC DEV 1ST LESION IMG BX SPEC US GUIDE 08/29/2023 [facility]   BREAST BIOPSY Left 09/27/2023   [place] LT RADIOACTIVE SEED LOC 09/27/2023 [facility]   BREAST LUMPECTOMY Left 2002, 2014   BREAST LUMPECTOMY WITH RADIOACTIVE SEED LOCALIZATION Left 09/30/2023   Procedure: LEFT BREAST SEED GUIDED LUMPECTOMY;  Surgeon: [name], MD;  Location: [facility];  Service: General;  Laterality: Left;  LMA   CHOLECYSTECTOMY     COLONOSCOPY N/A 10/26/2015   Procedure: COLONOSCOPY;  Surgeon: Malissa Hippo, MD;  Location: [facility];  Service: Endoscopy;  Laterality: N/A;  1200   DEBRIDEMENT AND CLOSURE WOUND Left 03/25/2018   Procedure: DEBRIDEMENTINTERPHALANGEAL LEFT INDEX FINGER;  Surgeon: Cindee Salt, MD;  Location: [facility];  Service: Orthopedics;  Laterality: Left;   EXCISION MASS UPPER EXTREMETIES Left 03/25/2018   Procedure: EXCISION MASS LEFT INDEX;  Surgeon: Cindee Salt, MD;  Location: [facility];  Service: Orthopedics;  Laterality: Left;   EYE SURGERY     bilateral cataracts   MAXILLARY ANTROSTOMY Left 12/25/2021   Procedure: LEFT MAXILLARY ANTROSTOMY WITH TISSUE REMOVAL;  Surgeon: Newman Pies, MD;  Location: [facility];  Service: ENT;  Laterality: Left;   PARTIAL MASTECTOMY WITH NEEDLE LOCALIZATION AND AXILLARY SENTINEL LYMPH NODE BX Left 08/12/2013   Procedure: LEFT PARTIAL MASTECTOMY WITH NEEDLE LOCALIZATION ATTEMPTED AXILLARY SENTINEL LYMPH NODE BIOPSY;  Surgeon: [name], MD;  Location: [facility];  Service: General;  Laterality: Left;   RE-EXCISION OF BREAST LUMPECTOMY Left 08/27/2013   Procedure: RE-EXCISION OF BREAST LUMPECTOMY;  Surgeon: [name], MD;  Location: [facility];  Service: General;  Laterality: Left;    SOCIAL HISTORY: Social History   Socioeconomic History   Marital status: Married    Spouse name: Kathlene November   Number of children: 2    Years of education: Not on file   Highest education level: Bachelor's degree (e.g., BA, AB, BS)  Occupational History   Occupation: retired  Tobacco Use   Smoking status: Former    Current packs/day: 0.00    Average packs/day: 0.5 packs/day for 15.0 years (7.5 ttl pk-yrs)    Types: Cigarettes    Start date: 07/21/1984    Quit date: 07/22/1999    Years since quitting: 24.5   Smokeless tobacco: Never  Vaping Use   Vaping status: Never Used  Substance and Sexual Activity   Alcohol use: Yes    Comment: one alcohol drink daily   Drug use: No   Sexual activity: Yes  Other Topics Concern   Not on file  Social History Narrative   Very independent and pretty active   Social Drivers of Corporate investment banker Strain: Low Risk  (01/21/2024)   Overall Financial Resource Strain (CARDIA)    Difficulty of Paying Living Expenses: Not hard at all  Food Insecurity: No Food Insecurity (01/21/2024)   Hunger Vital Sign    Worried About  Running Out of Food in the Last Year: Never true    Ran Out of Food in the Last Year: Never true  Transportation Needs: No Transportation Needs (01/21/2024)   PRAPARE - Administrator, Civil Service (Medical): No    Lack of Transportation (Non-Medical): No  Physical Activity: Sufficiently Active (01/21/2024)   Exercise Vital Sign    Days of Exercise per Week: 5 days    Minutes of Exercise per Session: 30 min  Stress: No Stress Concern Present (01/21/2024)   [employer] of Occupational Health - Occupational Stress Questionnaire    Feeling of Stress : Not at all  Social Connections: Socially Integrated (01/21/2024)   Social Connection and Isolation Panel [NHANES]    Frequency of Communication with Friends and Family: More than three times a week    Frequency of Social Gatherings with Friends and Family: Twice a week    Attends Religious Services: More than 4 times per year    Active Member of [employer] or Organizations: Yes    Attends Tax inspector Meetings: 1 to 4 times per year    Marital Status: Married  Catering manager Violence: Not At Risk (12/11/2023)   Humiliation, Afraid, Rape, and Kick questionnaire    Fear of Current or Ex-Partner: No    Emotionally Abused: No    Physically Abused: No    Sexually Abused: No    FAMILY HISTORY: Family History  Problem Relation Age of Onset   Hypertension Mother    Diabetes Father    Stroke Father    Breast cancer Sister 48   Breast cancer Sister    Aneurysm Sister    Cancer Brother    Heart disease Brother     ALLERGIES:  is allergic to penicillins and tetracyclines & related.  MEDICATIONS:  Current Outpatient Medications  Medication Sig Dispense Refill   benzonatate (TESSALON PERLES) 100 MG capsule Take 1 capsule (100 mg total) by mouth 3 (three) times daily as needed for cough. 30 capsule 0   cholecalciferol (VITAMIN D3) 25 MCG (1000 UNIT) tablet Take 2 tablets (2,000 Units total) by mouth daily.     guaiFENesin (MUCINEX) 600 MG 12 hr tablet Take 1 tablet (600 mg total) by mouth 2 (two) times daily for 10 days. 20 tablet 0   letrozole (FEMARA) 2.5 MG tablet Take 1 tablet (2.5 mg total) by mouth daily. 90 tablet 3   Multiple Vitamin (MULTIVITAMIN) tablet Take 1 tablet by mouth daily.     triamterene-hydrochlorothiazide (MAXZIDE-25) 37.5-25 MG tablet Take 0.5 tablets by mouth daily. 45 tablet 3   valsartan (DIOVAN) 160 MG tablet Take 1 tablet (160 mg total) by mouth daily. for blood pressure **NEEDS TO BE SEEN BEFORE NEXT REFILL** 30 tablet 0   vitamin B-12 (CYANOCOBALAMIN) 1000 MCG tablet Take 1,000 mcg by mouth daily.     No current facility-administered medications for this visit.   Facility-Administered Medications Ordered in Other Visits  Medication Dose Route Frequency Provider Last Rate Last Admin   0.9 %  sodium chloride infusion   Intravenous Continuous Rachel Moulds, MD   Stopped at 01/28/24 1528     PHYSICAL EXAMINATION: ECOG PERFORMANCE STATUS: 0  - Asymptomatic  Vitals:   01/28/24 1346 01/28/24 1348  BP: (!) 151/51 (!) 147/52  Pulse: 78   Resp: 18   Temp: 98.2 F (36.8 C)   SpO2: 100%       Filed Weights   01/28/24 1346  Weight: 164 lb (74.4 kg)  GENERAL:alert, no distress and comfortable Chest: Clear to auscultation bilaterally Heart: Rate and rhythm regular Abdomen: Soft, nontender, nondistended No lower extremity edema  LABORATORY DATA:  I have reviewed the data as listed Lab Results  Component Value Date   WBC 3.5 01/21/2024   HGB 12.0 01/21/2024   HCT 37.1 01/21/2024   MCV 93 01/21/2024   PLT 198 01/21/2024   Lab Results  Component Value Date   NA 138 01/21/2024   K 4.3 01/21/2024   CL 100 01/21/2024   CO2 22 01/21/2024    RADIOGRAPHIC STUDIES: I have personally reviewed the radiological reports and agreed with the findings in the report.  ASSESSMENT AND PLAN:  Breast cancer of upper-inner quadrant of left female breast This is a 83 year old female patient with past medical history significant for left breast cancer in 2000, 2014 now presents with new onset left breast invasive ductal carcinoma, grade 3, ER/PR and HER2 amplified.  Patient has followed up with Dr. Dwain Sarna and the recommendation was to consider mastectomy given the HER2 amplified breast cancer but patient is strongly opposed to it.  She is not willing to consider mastectomy especially because she is worried about her quality of life.  She is now status post lumpectomy, final pathology showed grade 2 IDC measuring 1 x 0.8 x 0.5 cm, negative margins, no sentinel lymph nodes submitted. She did not want to consider adjuvant chemotherapy hence have discussed about subcutaneous/IV Herceptin every 3 weeks for a whole year along with antiestrogen therapy once she completes radiation.  Triple Positive Breast Cancer Completed radiation therapy, tolerated it very well.Receiving Herceptin with no reported side effects. Tolerating Letrozole  well, No significant side effects reported. Continue Letrozole and infusions as planned. Schedule echocardiogram every three months, next one due day after tomorrow.  Sinusitis Recently treated with Z-Pak by another physician, symptoms resolved. -No further action required at this time.  Gastrointestinal Disturbance Recent diarrhea likely secondary to Z-Pak, resolved with probiotics and diet modification. -Continue probiotics and diet modification as needed.  Follow-up Patient is doing well, can extend visits to every other infusion. -Next visit on March 10, 2024.      All questions were answered. The patient knows to call the clinic with any problems, questions or concerns.    Rachel Moulds, MD 01/28/24

## 2024-01-30 ENCOUNTER — Other Ambulatory Visit (HOSPITAL_COMMUNITY): Payer: Medicare PPO

## 2024-01-30 ENCOUNTER — Ambulatory Visit (HOSPITAL_COMMUNITY)
Admission: RE | Admit: 2024-01-30 | Discharge: 2024-01-30 | Disposition: A | Discharge status: Home / Self Care | Payer: Medicare PPO | Source: Ambulatory Visit | Attending: Hematology and Oncology | Admitting: Hematology and Oncology

## 2024-01-30 DIAGNOSIS — Z01818 Encounter for other preprocedural examination: Secondary | ICD-10-CM | POA: Diagnosis not present

## 2024-01-30 DIAGNOSIS — C50412 Malignant neoplasm of upper-outer quadrant of left female breast: Secondary | ICD-10-CM | POA: Diagnosis not present

## 2024-01-30 DIAGNOSIS — Z170 Estrogen receptor positive status [ER+]: Secondary | ICD-10-CM | POA: Diagnosis not present

## 2024-01-30 DIAGNOSIS — I340 Nonrheumatic mitral (valve) insufficiency: Secondary | ICD-10-CM | POA: Insufficient documentation

## 2024-01-30 DIAGNOSIS — Z0189 Encounter for other specified special examinations: Secondary | ICD-10-CM

## 2024-01-30 DIAGNOSIS — I10 Essential (primary) hypertension: Secondary | ICD-10-CM | POA: Diagnosis not present

## 2024-01-30 LAB — ECHOCARDIOGRAM COMPLETE
Area-P 1/2: 4.26 cm2
MV M vel: 6.07 m/s
MV Peak grad: 147.4 mmHg
S' Lateral: 2.8 cm

## 2024-02-04 ENCOUNTER — Ambulatory Visit (INDEPENDENT_AMBULATORY_CARE_PROVIDER_SITE_OTHER)

## 2024-02-04 ENCOUNTER — Encounter: Payer: Self-pay | Admitting: Family Medicine

## 2024-02-04 ENCOUNTER — Ambulatory Visit: Payer: Medicare PPO | Admitting: Family Medicine

## 2024-02-04 VITALS — BP 139/64 | HR 77 | Temp 97.7°F | Ht 63.0 in | Wt 161.0 lb

## 2024-02-04 DIAGNOSIS — Z780 Asymptomatic menopausal state: Secondary | ICD-10-CM

## 2024-02-04 DIAGNOSIS — I10 Essential (primary) hypertension: Secondary | ICD-10-CM | POA: Diagnosis not present

## 2024-02-04 DIAGNOSIS — C50212 Malignant neoplasm of upper-inner quadrant of left female breast: Secondary | ICD-10-CM | POA: Diagnosis not present

## 2024-02-04 DIAGNOSIS — M810 Age-related osteoporosis without current pathological fracture: Secondary | ICD-10-CM | POA: Diagnosis not present

## 2024-02-04 DIAGNOSIS — Z170 Estrogen receptor positive status [ER+]: Secondary | ICD-10-CM | POA: Diagnosis not present

## 2024-02-04 MED ORDER — VALSARTAN 160 MG PO TABS: 160.0000 mg | ORAL_TABLET | Freq: Every day | ORAL | 0 refills | Status: DC

## 2024-02-04 NOTE — Progress Notes (Signed)
 Subjective:  Patient ID: Misty Wilkerson, female    DOB: [birth year]  Age: 83 y.o. MRN: [id number]  CC: Medical Management of Chronic Issues (No changes, just following up. )   HPI Misty Wilkerson presents for  presents for  follow-up of hypertension. Patient has no history of headache chest pain or shortness of breath or recent cough. Patient also denies symptoms of TIA such as focal numbness or weakness. Patient denies side effects from medication. States taking it regularly. Running 128/54 at home.   Currently getting breast cancer chemo infusions. Third time. Has had 4 with 13 more to go.   To get DEXA today due to menopausal state as well as hormonal deprivation for Ca.     02/04/2024    8:58 AM 12/11/2023    9:14 AM 09/24/2023    1:15 PM  Depression screen PHQ 2/9  Decreased Interest 0 1 1  Down, Depressed, Hopeless 0 1 1  PHQ - 2 Score 0 2 2  Altered sleeping 0 1   Tired, decreased energy 0 0   Change in appetite 0 0   Feeling bad or failure about yourself  0 0   Trouble concentrating 0 0   Moving slowly or fidgety/restless 0 0   Suicidal thoughts 0 0   PHQ-9 Score 0 3   Difficult doing work/chores Not difficult at all      History [name] has a past medical history of Allergy, Arthritis, Cancer (HCC), H/O hiatal hernia, Hypertension, Personal history of radiation therapy (2002), and Vertigo.   She has a past surgical history that includes Cholecystectomy; Eye surgery; Partial mastectomy with needle localization and axillary sentinel lymph node bx (Left, 08/12/2013); Re-excision of breast lumpectomy (Left, 08/27/2013); Colonoscopy (N/A, 10/26/2015); Excision mass upper extremeties (Left, 03/25/2018); Debridement and closure wound (Left, 03/25/2018); Breast lumpectomy (Left, 2002, 2014); Maxillary antrostomy (Left, 12/25/2021); Breast biopsy (Left, 08/29/2023); Breast biopsy (Left, 09/27/2023); and Breast lumpectomy with radioactive seed localization (Left, 09/30/2023).   Her  family history includes Aneurysm in her sister; Breast cancer in her sister; Breast cancer (age of onset: 18) in her sister; Cancer in her brother; Diabetes in her father; Heart disease in her brother; Hypertension in her mother; Stroke in her father.She reports that she quit smoking about 24 years ago. Her smoking use included cigarettes. She started smoking about 39 years ago. She has a 7.5 pack-year smoking history. She has never used smokeless tobacco. She reports current alcohol use. She reports that she does not use drugs.    ROS Review of Systems  Constitutional: Negative.   HENT: Negative.    Eyes:  Negative for visual disturbance.  Respiratory:  Negative for shortness of breath.   Cardiovascular:  Negative for chest pain.  Gastrointestinal:  Negative for abdominal pain.  Musculoskeletal:  Negative for arthralgias.    Objective:  BP 139/64   Pulse 77   Temp 97.7 F (36.5 C)   Ht 5\' 3"  (1.6 m)   Wt 161 lb (73 kg)   SpO2 98%   BMI 28.52 kg/m   BP Readings from Last 3 Encounters:  02/04/24 139/64  01/28/24 (!) 147/52  01/21/24 (!) 187/77    Wt Readings from Last 3 Encounters:  02/04/24 161 lb (73 kg)  01/28/24 164 lb (74.4 kg)  01/21/24 161 lb 9.6 oz (73.3 kg)     Physical Exam Constitutional:      General: She is not in acute distress.    Appearance: She is well-developed.  Cardiovascular:  Rate and Rhythm: Normal rate and regular rhythm.  Pulmonary:     Breath sounds: Normal breath sounds.  Musculoskeletal:        General: Normal range of motion.  Skin:    General: Skin is warm and dry.  Neurological:     Mental Status: She is alert and oriented to person, place, and time.       Assessment & Plan:   [name] was seen today for medical management of chronic issues.  Diagnoses and all orders for this visit:  Age-related osteoporosis without current pathological fracture  Benign essential hypertension  Post-menopausal -     DG [facility] DEXA;  Future  Malignant neoplasm of upper-inner quadrant of left breast in female, estrogen receptor positive (HCC)  Other orders -     valsartan (DIOVAN) 160 MG tablet; Take 1 tablet (160 mg total) by mouth daily. for blood pressure **NEEDS TO BE SEEN BEFORE NEXT REFILL**       I have discontinued [name]'s benzonatate. I am also having her maintain her cyanocobalamin, cholecalciferol, triamterene-hydrochlorothiazide, multivitamin, letrozole, and valsartan.  Allergies as of 02/04/2024       Reactions   Penicillins Shortness Of Breath, Rash   Tetracyclines & Related Itching        Medication List        Accurate as of February 04, 2024  5:01 PM. If you have any questions, ask your nurse or doctor.          STOP taking these medications    benzonatate 100 MG capsule Commonly known as: Lawyer Stopped by: [name]       TAKE these medications    cholecalciferol 25 MCG (1000 UNIT) tablet Commonly known as: VITAMIN D3 Take 2 tablets (2,000 Units total) by mouth daily.   cyanocobalamin 1000 MCG tablet Commonly known as: VITAMIN B12 Take 1,000 mcg by mouth daily.   letrozole 2.5 MG tablet Commonly known as: FEMARA Take 1 tablet (2.5 mg total) by mouth daily.   multivitamin tablet Take 1 tablet by mouth daily.   triamterene-hydrochlorothiazide 37.5-25 MG tablet Commonly known as: MAXZIDE-25 Take 0.5 tablets by mouth daily.   valsartan 160 MG tablet Commonly known as: DIOVAN Take 1 tablet (160 mg total) by mouth daily. for blood pressure **NEEDS TO BE SEEN BEFORE NEXT REFILL**         Follow-up: Return in about 6 months (around 08/06/2024).  [name], M.D.

## 2024-02-05 DIAGNOSIS — M8589 Other specified disorders of bone density and structure, multiple sites: Secondary | ICD-10-CM | POA: Diagnosis not present

## 2024-02-05 DIAGNOSIS — Z780 Asymptomatic menopausal state: Secondary | ICD-10-CM | POA: Diagnosis not present

## 2024-02-08 ENCOUNTER — Encounter: Payer: Self-pay | Admitting: Family Medicine

## 2024-02-08 NOTE — Progress Notes (Signed)
 DEXA shows osteopenia. I recommend weekly fosamax.Also make sure to take calcium 500 mg twice a day and your Vitamin D 2000 units a day.   Nurse, if pt. Is agreeable, send in Fosamax 70 mg weekly, #13.  Thanks, WS

## 2024-02-10 ENCOUNTER — Encounter: Payer: Self-pay | Admitting: Hematology and Oncology

## 2024-02-18 ENCOUNTER — Ambulatory Visit: Payer: Medicare PPO | Admitting: Hematology and Oncology

## 2024-02-18 ENCOUNTER — Encounter: Payer: Self-pay | Admitting: Hematology and Oncology

## 2024-02-18 ENCOUNTER — Telehealth (INDEPENDENT_AMBULATORY_CARE_PROVIDER_SITE_OTHER): Payer: Self-pay | Admitting: Otolaryngology

## 2024-02-18 ENCOUNTER — Encounter: Payer: Self-pay | Admitting: *Deleted

## 2024-02-18 ENCOUNTER — Inpatient Hospital Stay: Payer: Medicare PPO

## 2024-02-18 VITALS — BP 146/65 | HR 93 | Temp 97.6°F | Resp 17 | Wt 160.4 lb

## 2024-02-18 DIAGNOSIS — C50412 Malignant neoplasm of upper-outer quadrant of left female breast: Secondary | ICD-10-CM | POA: Diagnosis not present

## 2024-02-18 DIAGNOSIS — Z170 Estrogen receptor positive status [ER+]: Secondary | ICD-10-CM

## 2024-02-18 DIAGNOSIS — Z5112 Encounter for antineoplastic immunotherapy: Secondary | ICD-10-CM | POA: Diagnosis not present

## 2024-02-18 DIAGNOSIS — Z79811 Long term (current) use of aromatase inhibitors: Secondary | ICD-10-CM | POA: Diagnosis not present

## 2024-02-18 DIAGNOSIS — R197 Diarrhea, unspecified: Secondary | ICD-10-CM | POA: Diagnosis not present

## 2024-02-18 DIAGNOSIS — J329 Chronic sinusitis, unspecified: Secondary | ICD-10-CM | POA: Diagnosis not present

## 2024-02-18 MED ORDER — SODIUM CHLORIDE 0.9 % IV SOLN: INTRAVENOUS | Status: DC

## 2024-02-18 MED ORDER — DIPHENHYDRAMINE HCL 25 MG PO CAPS
50.0000 mg | ORAL_CAPSULE | Freq: Once | ORAL | Status: AC
  Administered 2024-02-18: 50 mg via ORAL
  Filled 2024-02-18: qty 2

## 2024-02-18 MED ORDER — ACETAMINOPHEN 325 MG PO TABS
650.0000 mg | ORAL_TABLET | Freq: Once | ORAL | Status: AC
  Administered 2024-02-18: 650 mg via ORAL
  Filled 2024-02-18: qty 2

## 2024-02-18 MED ORDER — TRASTUZUMAB-ANNS CHEMO 150 MG IV SOLR
6.0000 mg/kg | Freq: Once | INTRAVENOUS | Status: AC
  Administered 2024-02-18: 420 mg via INTRAVENOUS
  Filled 2024-02-18: qty 20

## 2024-02-18 NOTE — Telephone Encounter (Signed)
 Confirmed appt date and location for 02/19/2024 with spouse.

## 2024-02-19 ENCOUNTER — Encounter (INDEPENDENT_AMBULATORY_CARE_PROVIDER_SITE_OTHER): Payer: Self-pay

## 2024-02-19 ENCOUNTER — Ambulatory Visit (INDEPENDENT_AMBULATORY_CARE_PROVIDER_SITE_OTHER): Payer: Medicare PPO

## 2024-02-19 VITALS — BP 146/73 | HR 80 | Ht 61.5 in | Wt 160.0 lb

## 2024-02-19 DIAGNOSIS — R0981 Nasal congestion: Secondary | ICD-10-CM

## 2024-02-19 DIAGNOSIS — J343 Hypertrophy of nasal turbinates: Secondary | ICD-10-CM

## 2024-02-19 DIAGNOSIS — J310 Chronic rhinitis: Secondary | ICD-10-CM | POA: Diagnosis not present

## 2024-02-19 DIAGNOSIS — R0982 Postnasal drip: Secondary | ICD-10-CM

## 2024-02-19 NOTE — Progress Notes (Unsigned)
 Patient ID: Misty Wilkerson, female   DOB: 03/27/1941, 83 y.o.   MRN: [id number]  Follow-up: Chronic left maxillary sinusitis and polyposis  HPI:  The patient is an 83 year old female who returns today for her follow-up evaluation. The patient previously underwent left endoscopic sinus surgery in January 2023 to treat her chronic maxillary sinusitis and polyposis.  She had a large mucous retention cyst within the left maxillary sinus.  The patient returns today reporting no significant difficulty since her last visit.  She has occasional nasal congestion and drainage.  She was recently diagnosed with recurrent breast cancer.  She currently denies any facial pain, fever, or visual change.  She is able to breathe through both nostrils.  Exam: General: Communicates without difficulty, well nourished, no acute distress. Head: Normocephalic, no evidence injury, no tenderness, facial buttresses intact without stepoff. Face/sinus: No tenderness to palpation and percussion. Facial movement is normal and symmetric. Eyes: PERRL, EOMI. No scleral icterus, conjunctivae clear. Neuro: CN II exam reveals vision grossly intact.  No nystagmus at any point of gaze. Ears: Auricles well formed without lesions.  Ear canals are intact without mass or lesion.  No erythema or edema is appreciated.  The TMs are intact without fluid. Nose: External evaluation reveals normal support and skin without lesions.  Dorsum is intact.  Anterior rhinoscopy reveals congested mucosa over anterior aspect of inferior turbinates and intact septum.  No purulence noted. Oral:  Oral cavity and oropharynx are intact, symmetric, without erythema or edema.  Mucosa is moist without lesions. Neck: Full range of motion without pain.  There is no significant lymphadenopathy.  No masses palpable.  Thyroid bed within normal limits to palpation.  Parotid glands and submandibular glands equal bilaterally without mass.  Trachea is midline. Neuro:  CN 2-12  grossly intact.   Assessment: 1.  Chronic rhinitis with nasal mucosal congestion and bilateral turbinate hypertrophy. 2.  Her left maxillary antrum is patent.  No recurrent polyposis or infection is noted. 3.  Chronic postnasal drainage.  Plan: 1.  The physical exam findings are reviewed with the patient. 2.  The patient is reassured that no recurrent polyposis or infection is noted. 3.  Nasal saline irrigation as needed.   4.  The patient will return for reevaluation in 6 months.

## 2024-02-20 DIAGNOSIS — J310 Chronic rhinitis: Secondary | ICD-10-CM | POA: Insufficient documentation

## 2024-02-20 DIAGNOSIS — R0982 Postnasal drip: Secondary | ICD-10-CM | POA: Insufficient documentation

## 2024-02-20 DIAGNOSIS — J343 Hypertrophy of nasal turbinates: Secondary | ICD-10-CM | POA: Insufficient documentation

## 2024-03-10 ENCOUNTER — Inpatient Hospital Stay: Payer: Medicare PPO | Admitting: Hematology and Oncology

## 2024-03-10 ENCOUNTER — Inpatient Hospital Stay: Payer: Medicare PPO | Attending: Hematology and Oncology

## 2024-03-10 VITALS — BP 132/50 | HR 78 | Temp 97.7°F | Resp 16 | Wt 159.3 lb

## 2024-03-10 DIAGNOSIS — C50212 Malignant neoplasm of upper-inner quadrant of left female breast: Secondary | ICD-10-CM

## 2024-03-10 DIAGNOSIS — C50412 Malignant neoplasm of upper-outer quadrant of left female breast: Secondary | ICD-10-CM | POA: Insufficient documentation

## 2024-03-10 DIAGNOSIS — Z170 Estrogen receptor positive status [ER+]: Secondary | ICD-10-CM | POA: Diagnosis not present

## 2024-03-10 DIAGNOSIS — M8580 Other specified disorders of bone density and structure, unspecified site: Secondary | ICD-10-CM | POA: Diagnosis not present

## 2024-03-10 DIAGNOSIS — Z5112 Encounter for antineoplastic immunotherapy: Secondary | ICD-10-CM | POA: Insufficient documentation

## 2024-03-10 DIAGNOSIS — I10 Essential (primary) hypertension: Secondary | ICD-10-CM | POA: Diagnosis not present

## 2024-03-10 MED ORDER — HEPARIN SOD (PORK) LOCK FLUSH 100 UNIT/ML IV SOLN: 500.0000 [IU] | Freq: Once | INTRAVENOUS | Status: DC | PRN

## 2024-03-10 MED ORDER — DIPHENHYDRAMINE HCL 25 MG PO CAPS
50.0000 mg | ORAL_CAPSULE | Freq: Once | ORAL | Status: AC
  Administered 2024-03-10: 50 mg via ORAL
  Filled 2024-03-10: qty 2

## 2024-03-10 MED ORDER — TRASTUZUMAB-ANNS CHEMO 150 MG IV SOLR
6.0000 mg/kg | Freq: Once | INTRAVENOUS | Status: AC
  Administered 2024-03-10: 420 mg via INTRAVENOUS
  Filled 2024-03-10: qty 20

## 2024-03-10 MED ORDER — ACETAMINOPHEN 325 MG PO TABS
650.0000 mg | ORAL_TABLET | Freq: Once | ORAL | Status: AC
  Administered 2024-03-10: 650 mg via ORAL
  Filled 2024-03-10: qty 2

## 2024-03-10 MED ORDER — SODIUM CHLORIDE 0.9 % IV SOLN: INTRAVENOUS | Status: DC

## 2024-03-10 MED ORDER — SODIUM CHLORIDE 0.9% FLUSH: 10.0000 mL | INTRAVENOUS | Status: DC | PRN

## 2024-03-10 NOTE — Assessment & Plan Note (Signed)
 This is a 83 year old female patient with past medical history significant for left breast cancer in 2000, 2014 now presents with new onset left breast invasive ductal carcinoma, grade 3, ER/PR and HER2 amplified.  Patient has followed up with Dr. Delane Fear and the recommendation was to consider mastectomy given the HER2 amplified breast cancer but patient is strongly opposed to it.  She is not willing to consider mastectomy especially because she is worried about her quality of life.  She is now status post lumpectomy, final pathology showed grade 2 IDC measuring 1 x 0.8 x 0.5 cm, negative margins, no sentinel lymph nodes submitted. She did not want to consider adjuvant chemotherapy hence have discussed about subcutaneous/IV Herceptin every 3 weeks for a whole year along with letrozole.  Assessment and Plan Assessment & Plan Breast cancer Undergoing Herceptin and letrozole treatment without issues. Echocardiogram showed no cardiotoxicity. Diarrhea not cancer-related. - Continue Herceptin and letrozole.  Osteopenia Bone density scan indicates osteopenia. Letrozole may worsen bone density. - Continue daily walking. - Incorporate small weights into exercise routine. - Ensure adequate vitamin D intake.  Lactose intolerance Severe diarrhea linked to dairy, improved with dietary modifications. - Recommend lactase enzyme supplements before consuming dairy products.  Hypertension Diastolic blood pressure concern addressed. MAP within normal range, indicating adequate perfusion. - Monitor blood pressure and MAP.

## 2024-03-10 NOTE — Patient Instructions (Signed)
 [facility] - A DEPT OF [facility]  Discharge Instructions: Thank you for choosing [facility] to provide your oncology and hematology care.   If you have a lab appointment with the Cancer Center, please go directly to the Cancer Center and check in at the registration area.   Wear comfortable clothing and clothing appropriate for easy access to any Portacath or PICC line.   We strive to give you quality time with your provider. You may need to reschedule your appointment if you arrive late (15 or more minutes).  Arriving late affects you and other patients whose appointments are after yours.  Also, if you miss three or more appointments without notifying the office, you may be dismissed from the clinic at the provider's discretion.      For prescription refill requests, have your pharmacy contact our office and allow 72 hours for refills to be completed.    Today you received the following chemotherapy and/or immunotherapy agents: Kanjinti      To help prevent nausea and vomiting after your treatment, we encourage you to take your nausea medication as directed.  BELOW ARE SYMPTOMS THAT SHOULD BE REPORTED IMMEDIATELY: *FEVER GREATER THAN 100.4 F (38 C) OR HIGHER *CHILLS OR SWEATING *NAUSEA AND VOMITING THAT IS NOT CONTROLLED WITH YOUR NAUSEA MEDICATION *UNUSUAL SHORTNESS OF BREATH *UNUSUAL BRUISING OR BLEEDING *URINARY PROBLEMS (pain or burning when urinating, or frequent urination) *BOWEL PROBLEMS (unusual diarrhea, constipation, pain near the anus) TENDERNESS IN MOUTH AND THROAT WITH OR WITHOUT PRESENCE OF ULCERS (sore throat, sores in mouth, or a toothache) UNUSUAL RASH, SWELLING OR PAIN  UNUSUAL VAGINAL DISCHARGE OR ITCHING   Items with * indicate a potential emergency and should be followed up as soon as possible or go to the Emergency Department if any problems should occur.  Please show the CHEMOTHERAPY ALERT CARD or IMMUNOTHERAPY  ALERT CARD at check-in to the Emergency Department and triage nurse.  Should you have questions after your visit or need to cancel or reschedule your appointment, please contact [facility] - A DEPT [facility]  Dept: [phone]  and follow the prompts.  Office hours are 8:00 a.m. to 4:30 p.m. Monday - Friday. Please note that voicemails left after 4:00 p.m. may not be returned until the following business day.  We are closed weekends and major holidays. You have access to a nurse at all times for urgent questions. Please call the main number to the clinic Dept: [phone] and follow the prompts.   For any non-urgent questions, you may also contact your provider using MyChart. We now offer e-Visits for anyone 60 and older to request care online for non-urgent symptoms. For details visit [email or web address].   Also download the MyChart app! Go to the app store, search "MyChart", open the app, select [facility], and log in with your MyChart username and password.

## 2024-03-10 NOTE — Progress Notes (Signed)
 [facility] CONSULT NOTE  Patient Care Team: [name], MD as PCP - General (Family Medicine) Pershing Proud, RN as Oncology Nurse Navigator Donnelly Angelica, RN as Oncology Nurse Navigator  CHIEF COMPLAINTS/PURPOSE OF CONSULTATION:  Newly diagnosed breast cancer  HISTORY OF PRESENTING ILLNESS:  Misty Wilkerson 83 y.o. female is here because of recent diagnosis of left breast cancer  I reviewed her records extensively and collaborated the history with the patient.  SUMMARY OF ONCOLOGIC HISTORY: Oncology History  Breast cancer of upper-inner quadrant of left female breast (HCC)  12/08/1998 Initial Biopsy   Left breast cancer: Lumpectomy followed by radiation T1 BN 0M0 could not tolerate tamoxifen or alternative therapies.   08/12/2013 Surgery   Left breast lumpectomy: Tumor 1  invasive ductal carcinoma 0.4 cm with DCIS, tumor to: IDC 1.2 cm with DCIS positive margin, ER 98%, PR 14%, HER-2 negative, Ki-67 19% margins reexcision negative   09/07/2013 -  Anti-estrogen oral therapy   Letrozole 2.5 mg daily plans for 5 years   Breast cancer of upper-outer quadrant of left female breast (HCC)  09/10/2023 Initial Diagnosis   Breast cancer of upper-outer quadrant of left female breast (HCC)   09/10/2023 Cancer Staging   Staging form: Breast, AJCC 8th Edition - Clinical: Stage IA (cT1, cN0, cM0, G3, ER+, PR+, HER2+) - Signed by Rachel Moulds, MD on 09/10/2023 Stage prefix: Initial diagnosis Histologic grading system: 3 grade system   11/25/2023 -  Chemotherapy   Patient is on Treatment Plan : BREAST Trastuzumab IV (8/6) or SQ (600) D1 q21d     11/26/2023 - 11/26/2023 Chemotherapy   Patient is on Treatment Plan : BREAST Paclitaxel + Trastuzumab q7d / Trastuzumab q21d     11/26/2023 - 11/26/2023 Chemotherapy   Patient is on Treatment Plan : BREAST Trastuzumab SQ (600) D1 q21d      The patient, an 83 year old woman with a history of breast cancer, presents  with a new breast mass. This is her third occurrence of breast cancer, with previous diagnoses in 2000 and 2014.  Since her last visit here she had left breast lumpectomy, final pathology showed invasive ductal carcinoma measuring 1 x 0.8 x 0.5 cm, grade 2, DCIS focal solid type grade 2, negative margins for both invasive and DCIS.  All margins are negative for malignancy.  No lymph node specimen was submitted.  Prognostics from previous breast biopsy showed ER +100% strong PR +100% strong and HER2 positive by FISH with Ki-67 of 30%.   Discussed the use of AI scribe software for clinical note transcription with the patient, who gave verbal consent to proceed. History of Present Illness Misty Wilkerson is an 83 year old female who presents for follow-up regarding her cancer treatment and recent gastrointestinal symptoms.  She experiences severe diarrhea associated with dairy consumption, suggesting lactose intolerance. She has modified her diet by reducing dairy intake and consuming foods like applesauce, toast, bananas, and sweet potatoes, which has improved her symptoms. An episode of consuming an ice cream pie led to immediate gastrointestinal distress.  She is currently on Herceptin and letrozole for her cancer treatment. She confirms adherence to letrozole and has not experienced any issues with Herceptin. She had previously contacted a nurse to inquire if letrozole could be causing her symptoms, but by then, she had already adjusted her diet.  A bone density scan in March showed osteopenia. She engages in daily walking to help maintain bone health.  Rest of the pertinent 10 point ROS reviewed and  neg.   MEDICAL HISTORY:  Past Medical History:  Diagnosis Date   Allergy    Arthritis    Cancer (HCC)    left breast   H/O hiatal hernia    eats slowly  now   Hypertension    Personal history of radiation therapy 2002   left breast   Vertigo     SURGICAL HISTORY: Past Surgical History:   Procedure Laterality Date   BREAST BIOPSY Left 08/29/2023   [place] LT BREAST BX W LOC DEV 1ST LESION IMG BX SPEC US GUIDE 08/29/2023 [facility]   BREAST BIOPSY Left 09/27/2023   [place] LT RADIOACTIVE SEED LOC 09/27/2023 [facility]   BREAST LUMPECTOMY Left 2002, 2014   BREAST LUMPECTOMY WITH RADIOACTIVE SEED LOCALIZATION Left 09/30/2023   Procedure: LEFT BREAST SEED GUIDED LUMPECTOMY;  Surgeon: [name], MD;  Location: [facility];  Service: General;  Laterality: Left;  LMA   CHOLECYSTECTOMY     COLONOSCOPY N/A 10/26/2015   Procedure: COLONOSCOPY;  Surgeon: Malissa Hippo, MD;  [facility];  Service: Endoscopy;  Laterality: N/A;  1200   DEBRIDEMENT AND CLOSURE WOUND Left 03/25/2018   Procedure: DEBRIDEMENTINTERPHALANGEAL LEFT INDEX FINGER;  Surgeon: Cindee Salt, MD;  Location: [facility];  Service: Orthopedics;  Laterality: Left;   EXCISION MASS UPPER EXTREMETIES Left 03/25/2018   Procedure: EXCISION MASS LEFT INDEX;  Surgeon: Cindee Salt, MD;  Location: [facility];  Service: Orthopedics;  Laterality: Left;   EYE SURGERY     bilateral cataracts   MAXILLARY ANTROSTOMY Left 12/25/2021   Procedure: LEFT MAXILLARY ANTROSTOMY WITH TISSUE REMOVAL;  Surgeon: Newman Pies, MD;  Location: [facility];  Service: ENT;  Laterality: Left;   PARTIAL MASTECTOMY WITH NEEDLE LOCALIZATION AND AXILLARY SENTINEL LYMPH NODE BX Left 08/12/2013   Procedure: LEFT PARTIAL MASTECTOMY WITH NEEDLE LOCALIZATION ATTEMPTED AXILLARY SENTINEL LYMPH NODE BIOPSY;  Surgeon: [name], MD;  Location: [facility];  Service: General;  Laterality: Left;   RE-EXCISION OF BREAST LUMPECTOMY Left 08/27/2013   Procedure: RE-EXCISION OF BREAST LUMPECTOMY;  Surgeon: [name], MD;  Location: [facility];  Service: General;  Laterality: Left;    SOCIAL HISTORY: Social History   Socioeconomic History   Marital status: Married    Spouse name: Kathlene November    Number of children: 2   Years of education: Not on file   Highest education level: Bachelor's degree (e.g., BA, AB, BS)  Occupational History   Occupation: retired  Tobacco Use   Smoking status: Former    Current packs/day: 0.00    Average packs/day: 0.5 packs/day for 15.0 years (7.5 ttl pk-yrs)    Types: Cigarettes    Start date: 07/21/1984    Quit date: 07/22/1999    Years since quitting: 24.6   Smokeless tobacco: Never  Vaping Use   Vaping status: Never Used  Substance and Sexual Activity   Alcohol use: Yes    Comment: one alcohol drink daily   Drug use: No   Sexual activity: Yes  Other Topics Concern   Not on file  Social History Narrative   Very independent and pretty active   Social Drivers of Corporate investment banker Strain: Low Risk  (01/21/2024)   Overall Financial Resource Strain (CARDIA)    Difficulty of Paying Living Expenses: Not hard at all  Food Insecurity: No Food Insecurity (01/21/2024)   Hunger Vital Sign    Worried About Running Out of Food in the Last Year:  Never true    Ran Out of Food in the Last Year: Never true  Transportation Needs: No Transportation Needs (01/21/2024)   PRAPARE - Administrator, Civil Service (Medical): No    Lack of Transportation (Non-Medical): No  Physical Activity: Sufficiently Active (01/21/2024)   Exercise Vital Sign    Days of Exercise per Week: 5 days    Minutes of Exercise per Session: 30 min  Stress: No Stress Concern Present (01/21/2024)   [employer] of Occupational Health - Occupational Stress Questionnaire    Feeling of Stress : Not at all  Social Connections: Socially Integrated (01/21/2024)   Social Connection and Isolation Panel [NHANES]    Frequency of Communication with Friends and Family: More than three times a week    Frequency of Social Gatherings with Friends and Family: Twice a week    Attends Religious Services: More than 4 times per year    Active Member of [employer] or Organizations: Yes     Attends Banker Meetings: 1 to 4 times per year    Marital Status: Married  Catering manager Violence: Not At Risk (12/11/2023)   Humiliation, Afraid, Rape, and Kick questionnaire    Fear of Current or Ex-Partner: No    Emotionally Abused: No    Physically Abused: No    Sexually Abused: No    FAMILY HISTORY: Family History  Problem Relation Age of Onset   Hypertension Mother    Diabetes Father    Stroke Father    Breast cancer Sister 36   Breast cancer Sister    Aneurysm Sister    Cancer Brother    Heart disease Brother     ALLERGIES:  is allergic to penicillins and tetracyclines & related.  MEDICATIONS:  Current Outpatient Medications  Medication Sig Dispense Refill   cholecalciferol (VITAMIN D3) 25 MCG (1000 UNIT) tablet Take 2 tablets (2,000 Units total) by mouth daily.     letrozole (FEMARA) 2.5 MG tablet Take 1 tablet (2.5 mg total) by mouth daily. 90 tablet 3   Multiple Vitamin (MULTIVITAMIN) tablet Take 1 tablet by mouth daily.     triamterene-hydrochlorothiazide (MAXZIDE-25) 37.5-25 MG tablet Take 0.5 tablets by mouth daily. 45 tablet 3   valsartan (DIOVAN) 160 MG tablet Take 1 tablet (160 mg total) by mouth daily. for blood pressure **NEEDS TO BE SEEN BEFORE NEXT REFILL** 30 tablet 0   vitamin B-12 (CYANOCOBALAMIN) 1000 MCG tablet Take 1,000 mcg by mouth daily.     No current facility-administered medications for this visit.   Facility-Administered Medications Ordered in Other Visits  Medication Dose Route Frequency Provider Last Rate Last Admin   0.9 %  sodium chloride infusion   Intravenous Continuous Kylan Liberati, MD       acetaminophen (TYLENOL) tablet 650 mg  650 mg Oral Once Shelene Krage, Burnice Logan, MD       diphenhydrAMINE (BENADRYL) capsule 50 mg  50 mg Oral Once Keylee Shrestha, Burnice Logan, MD       heparin lock flush 100 unit/mL  500 Units Intracatheter Once PRN Moria Brophy, MD       sodium chloride flush (NS) 0.9 % injection 10 mL  10 mL Intracatheter  PRN Ota Ebersole, MD       trastuzumab-anns (KANJINTI) 420 mg in sodium chloride 0.9 % 250 mL chemo infusion  6 mg/kg (Treatment Plan Recorded) Intravenous Once Rachel Moulds, MD         PHYSICAL EXAMINATION: ECOG PERFORMANCE STATUS: 0 - Asymptomatic  Vitals:  03/10/24 1237  BP: (!) 132/50  Pulse: 78  Resp: 16  Temp: 97.7 F (36.5 C)  SpO2: 99%      Filed Weights   03/10/24 1237  Weight: 159 lb 4.8 oz (72.3 kg)    GENERAL:alert, no distress and comfortable Chest: Clear to auscultation bilaterally Heart: Rate and rhythm regular Abdomen: Soft, nontender, nondistended No lower extremity edema  LABORATORY DATA:  I have reviewed the data as listed Lab Results  Component Value Date   WBC 3.5 01/21/2024   HGB 12.0 01/21/2024   HCT 37.1 01/21/2024   MCV 93 01/21/2024   PLT 198 01/21/2024   Lab Results  Component Value Date   NA 138 01/21/2024   K 4.3 01/21/2024   CL 100 01/21/2024   CO2 22 01/21/2024    RADIOGRAPHIC STUDIES: I have personally reviewed the radiological reports and agreed with the findings in the report.  ASSESSMENT AND PLAN:  Breast cancer of upper-inner quadrant of left female breast This is a 83 year old female patient with past medical history significant for left breast cancer in 2000, 2014 now presents with new onset left breast invasive ductal carcinoma, grade 3, ER/PR and HER2 amplified.  Patient has followed up with Dr. Delane Fear and the recommendation was to consider mastectomy given the HER2 amplified breast cancer but patient is strongly opposed to it.  She is not willing to consider mastectomy especially because she is worried about her quality of life.  She is now status post lumpectomy, final pathology showed grade 2 IDC measuring 1 x 0.8 x 0.5 cm, negative margins, no sentinel lymph nodes submitted. She did not want to consider adjuvant chemotherapy hence have discussed about subcutaneous/IV Herceptin every 3 weeks for a whole year  along with letrozole.  Assessment and Plan Assessment & Plan Breast cancer Undergoing Herceptin and letrozole treatment without issues. Echocardiogram showed no cardiotoxicity. Diarrhea not cancer-related. - Continue Herceptin and letrozole.  Osteopenia Bone density scan indicates osteopenia. Letrozole may worsen bone density. - Continue daily walking. - Incorporate small weights into exercise routine. - Ensure adequate vitamin D intake.  Lactose intolerance Severe diarrhea linked to dairy, improved with dietary modifications. - Recommend lactase enzyme supplements before consuming dairy products.  Hypertension Diastolic blood pressure concern addressed. MAP within normal range, indicating adequate perfusion. - Monitor blood pressure and MAP.        All questions were answered. The patient knows to call the clinic with any problems, questions or concerns.    Murleen Arms, MD 03/10/24

## 2024-03-31 ENCOUNTER — Encounter: Payer: Self-pay | Admitting: Hematology and Oncology

## 2024-03-31 ENCOUNTER — Ambulatory Visit: Admitting: Hematology and Oncology

## 2024-03-31 ENCOUNTER — Inpatient Hospital Stay: Attending: Hematology and Oncology

## 2024-03-31 VITALS — BP 162/72 | HR 76 | Temp 97.9°F | Resp 16 | Wt 163.5 lb

## 2024-03-31 DIAGNOSIS — G479 Sleep disorder, unspecified: Secondary | ICD-10-CM | POA: Diagnosis not present

## 2024-03-31 DIAGNOSIS — Z170 Estrogen receptor positive status [ER+]: Secondary | ICD-10-CM | POA: Insufficient documentation

## 2024-03-31 DIAGNOSIS — R062 Wheezing: Secondary | ICD-10-CM | POA: Diagnosis not present

## 2024-03-31 DIAGNOSIS — C50412 Malignant neoplasm of upper-outer quadrant of left female breast: Secondary | ICD-10-CM | POA: Insufficient documentation

## 2024-03-31 DIAGNOSIS — Z5112 Encounter for antineoplastic immunotherapy: Secondary | ICD-10-CM | POA: Diagnosis not present

## 2024-03-31 MED ORDER — DIPHENHYDRAMINE HCL 25 MG PO CAPS
50.0000 mg | ORAL_CAPSULE | Freq: Once | ORAL | Status: AC
  Administered 2024-03-31: 50 mg via ORAL
  Filled 2024-03-31: qty 2

## 2024-03-31 MED ORDER — SODIUM CHLORIDE 0.9 % IV SOLN: INTRAVENOUS | Status: DC

## 2024-03-31 MED ORDER — ACETAMINOPHEN 325 MG PO TABS
650.0000 mg | ORAL_TABLET | Freq: Once | ORAL | Status: AC
  Administered 2024-03-31: 650 mg via ORAL
  Filled 2024-03-31: qty 2

## 2024-03-31 MED ORDER — TRASTUZUMAB-ANNS CHEMO 150 MG IV SOLR
6.0000 mg/kg | Freq: Once | INTRAVENOUS | Status: AC
  Administered 2024-03-31: 420 mg via INTRAVENOUS
  Filled 2024-03-31: qty 20

## 2024-03-31 NOTE — Patient Instructions (Signed)
 [facility] - A DEPT OF [facility]  Discharge Instructions: Thank you for choosing [facility] to provide your oncology and hematology care.   If you have a lab appointment with the Cancer Center, please go directly to the Cancer Center and check in at the registration area.   Wear comfortable clothing and clothing appropriate for easy access to any Portacath or PICC line.   We strive to give you quality time with your provider. You may need to reschedule your appointment if you arrive late (15 or more minutes).  Arriving late affects you and other patients whose appointments are after yours.  Also, if you miss three or more appointments without notifying the office, you may be dismissed from the clinic at the provider's discretion.      For prescription refill requests, have your pharmacy contact our office and allow 72 hours for refills to be completed.    Today you received the following chemotherapy and/or immunotherapy agents: Kanjinti      To help prevent nausea and vomiting after your treatment, we encourage you to take your nausea medication as directed.  BELOW ARE SYMPTOMS THAT SHOULD BE REPORTED IMMEDIATELY: *FEVER GREATER THAN 100.4 F (38 C) OR HIGHER *CHILLS OR SWEATING *NAUSEA AND VOMITING THAT IS NOT CONTROLLED WITH YOUR NAUSEA MEDICATION *UNUSUAL SHORTNESS OF BREATH *UNUSUAL BRUISING OR BLEEDING *URINARY PROBLEMS (pain or burning when urinating, or frequent urination) *BOWEL PROBLEMS (unusual diarrhea, constipation, pain near the anus) TENDERNESS IN MOUTH AND THROAT WITH OR WITHOUT PRESENCE OF ULCERS (sore throat, sores in mouth, or a toothache) UNUSUAL RASH, SWELLING OR PAIN  UNUSUAL VAGINAL DISCHARGE OR ITCHING   Items with * indicate a potential emergency and should be followed up as soon as possible or go to the Emergency Department if any problems should occur.  Please show the CHEMOTHERAPY ALERT CARD or IMMUNOTHERAPY  ALERT CARD at check-in to the Emergency Department and triage nurse.  Should you have questions after your visit or need to cancel or reschedule your appointment, please contact [facility] - A DEPT [facility]  Dept: [phone]  and follow the prompts.  Office hours are 8:00 a.m. to 4:30 p.m. Monday - Friday. Please note that voicemails left after 4:00 p.m. may not be returned until the following business day.  We are closed weekends and major holidays. You have access to a nurse at all times for urgent questions. Please call the main number to the clinic Dept: [phone] and follow the prompts.   For any non-urgent questions, you may also contact your provider using MyChart. We now offer e-Visits for anyone 60 and older to request care online for non-urgent symptoms. For details visit [email or web address].   Also download the MyChart app! Go to the app store, search "MyChart", open the app, select [facility], and log in with your MyChart username and password.

## 2024-04-17 ENCOUNTER — Telehealth: Payer: Self-pay

## 2024-04-17 NOTE — Telephone Encounter (Signed)
Pt verbally confirmed appt

## 2024-04-18 ENCOUNTER — Other Ambulatory Visit: Payer: Self-pay | Admitting: Family Medicine

## 2024-04-21 ENCOUNTER — Inpatient Hospital Stay: Admitting: Hematology and Oncology

## 2024-04-21 ENCOUNTER — Inpatient Hospital Stay

## 2024-04-21 ENCOUNTER — Encounter: Payer: Self-pay | Admitting: *Deleted

## 2024-04-21 VITALS — BP 147/55 | HR 72 | Temp 98.2°F | Resp 16

## 2024-04-21 VITALS — BP 182/67 | HR 87 | Temp 98.3°F | Resp 18 | Wt 163.7 lb

## 2024-04-21 DIAGNOSIS — C50412 Malignant neoplasm of upper-outer quadrant of left female breast: Secondary | ICD-10-CM | POA: Diagnosis not present

## 2024-04-21 DIAGNOSIS — Z170 Estrogen receptor positive status [ER+]: Secondary | ICD-10-CM

## 2024-04-21 DIAGNOSIS — G479 Sleep disorder, unspecified: Secondary | ICD-10-CM | POA: Diagnosis not present

## 2024-04-21 DIAGNOSIS — C50212 Malignant neoplasm of upper-inner quadrant of left female breast: Secondary | ICD-10-CM | POA: Diagnosis not present

## 2024-04-21 DIAGNOSIS — Z5112 Encounter for antineoplastic immunotherapy: Secondary | ICD-10-CM | POA: Diagnosis not present

## 2024-04-21 DIAGNOSIS — R062 Wheezing: Secondary | ICD-10-CM | POA: Diagnosis not present

## 2024-04-21 MED ORDER — ACETAMINOPHEN 325 MG PO TABS
650.0000 mg | ORAL_TABLET | Freq: Once | ORAL | Status: AC
  Administered 2024-04-21: 650 mg via ORAL
  Filled 2024-04-21: qty 2

## 2024-04-21 MED ORDER — SODIUM CHLORIDE 0.9 % IV SOLN: INTRAVENOUS | Status: DC

## 2024-04-21 MED ORDER — TRASTUZUMAB-ANNS CHEMO 150 MG IV SOLR
6.0000 mg/kg | Freq: Once | INTRAVENOUS | Status: AC
  Administered 2024-04-21: 420 mg via INTRAVENOUS
  Filled 2024-04-21: qty 20

## 2024-04-21 MED ORDER — DIPHENHYDRAMINE HCL 25 MG PO CAPS
50.0000 mg | ORAL_CAPSULE | Freq: Once | ORAL | Status: AC
  Administered 2024-04-21: 50 mg via ORAL
  Filled 2024-04-21: qty 2

## 2024-04-21 NOTE — Patient Instructions (Signed)
 [facility] - A DEPT OF [facility]  Discharge Instructions: Thank you for choosing [facility] to provide your oncology and hematology care.   If you have a lab appointment with the Cancer Center, please go directly to the Cancer Center and check in at the registration area.   Wear comfortable clothing and clothing appropriate for easy access to any Portacath or PICC line.   We strive to give you quality time with your provider. You may need to reschedule your appointment if you arrive late (15 or more minutes).  Arriving late affects you and other patients whose appointments are after yours.  Also, if you miss three or more appointments without notifying the office, you may be dismissed from the clinic at the provider's discretion.      For prescription refill requests, have your pharmacy contact our office and allow 72 hours for refills to be completed.    Today you received the following chemotherapy and/or immunotherapy agent: Trastuzumab -Anns (Kanjinti )    To help prevent nausea and vomiting after your treatment, we encourage you to take your nausea medication as directed.  BELOW ARE SYMPTOMS THAT SHOULD BE REPORTED IMMEDIATELY: *FEVER GREATER THAN 100.4 F (38 C) OR HIGHER *CHILLS OR SWEATING *NAUSEA AND VOMITING THAT IS NOT CONTROLLED WITH YOUR NAUSEA MEDICATION *UNUSUAL SHORTNESS OF BREATH *UNUSUAL BRUISING OR BLEEDING *URINARY PROBLEMS (pain or burning when urinating, or frequent urination) *BOWEL PROBLEMS (unusual diarrhea, constipation, pain near the anus) TENDERNESS IN MOUTH AND THROAT WITH OR WITHOUT PRESENCE OF ULCERS (sore throat, sores in mouth, or a toothache) UNUSUAL RASH, SWELLING OR PAIN  UNUSUAL VAGINAL DISCHARGE OR ITCHING   Items with * indicate a potential emergency and should be followed up as soon as possible or go to the Emergency Department if any problems should occur.  Please show the CHEMOTHERAPY ALERT CARD or  IMMUNOTHERAPY ALERT CARD at check-in to the Emergency Department and triage nurse.  Should you have questions after your visit or need to cancel or reschedule your appointment, please contact [facility] - A DEPT OF [facility]  Dept: [phone]  and follow the prompts.  Office hours are 8:00 a.m. to 4:30 p.m. Monday - Friday. Please note that voicemails left after 4:00 p.m. may not be returned until the following business day.  We are closed weekends and major holidays. You have access to a nurse at all times for urgent questions. Please call the main number to the clinic Dept: [phone] and follow the prompts.   For any non-urgent questions, you may also contact your provider using MyChart. We now offer e-Visits for anyone 52 and older to request care online for non-urgent symptoms. For details visit [email or web address].   Also download the MyChart app! Go to the app store, search "MyChart", open the app, select [facility], and log in with your MyChart username and password.

## 2024-04-21 NOTE — Assessment & Plan Note (Signed)
 This is a 83 year old female patient with past medical history significant for left breast cancer in 2000, 2014 now presents with new onset left breast invasive ductal carcinoma, grade 3, ER/PR and HER2 amplified.  Patient has followed up with Dr. Delane Fear and the recommendation was to consider mastectomy given the HER2 amplified breast cancer but patient is strongly opposed to it.  She is not willing to consider mastectomy especially because she is worried about her quality of life.  She is now status post lumpectomy, final pathology showed grade 2 IDC measuring 1 x 0.8 x 0.5 cm, negative margins, no sentinel lymph nodes submitted. She did not want to consider adjuvant chemotherapy hence have discussed about subcutaneous/IV Herceptin  every 3 weeks for a whole year along with letrozole . Assessment & Plan Assessment and Plan Breast cancer, on adj letrozole  and herceptin , tolerating it well. No concerns.  Wheezing Intermittent wheezing when rolling to the right side. Likely related to environmental allergies, possibly dust exposure. Unlikely related to congestive heart failure. - Contact provider for inhaler prescription if wheezing worsens. - No adv sounds on exam today  Sleep disturbance Intermittent waking at 2 AM, possibly related to letrozole . Current melatonin dose is 5 mg. - Increase melatonin dose and monitor. - Adjust sleep schedule to 10:30 PM. - We can consider prescription sleep aid if she doesn't improve with increased dose of melatonin.

## 2024-04-21 NOTE — Progress Notes (Signed)
 [facility] CONSULT NOTE  Patient Care Team: Roise Cleaver, MD as PCP - General (Family Medicine) Auther Bo, RN as Oncology Nurse Navigator Alane Hsu, RN as Oncology Nurse Navigator  CHIEF COMPLAINTS/PURPOSE OF CONSULTATION:  Newly diagnosed breast cancer  HISTORY OF PRESENTING ILLNESS:  Misty Wilkerson 83 y.o. female is here because of recent diagnosis of left breast cancer  I reviewed her records extensively and collaborated the history with the patient.  SUMMARY OF ONCOLOGIC HISTORY: Oncology History  Breast cancer of upper-inner quadrant of left female breast (HCC)  12/08/1998 Initial Biopsy   Left breast cancer: Lumpectomy followed by radiation T1 BN 0M0 could not tolerate tamoxifen or alternative therapies.   08/12/2013 Surgery   Left breast lumpectomy: Tumor 1  invasive ductal carcinoma 0.4 cm with DCIS, tumor to: IDC 1.2 cm with DCIS positive margin, ER 98%, PR 14%, HER-2 negative, Ki-67 19% margins reexcision negative   09/07/2013 -  Anti-estrogen oral therapy   Letrozole  2.5 mg daily plans for 5 years   Breast cancer of upper-outer quadrant of left female breast (HCC)  09/10/2023 Initial Diagnosis   Breast cancer of upper-outer quadrant of left female breast (HCC)   09/10/2023 Cancer Staging   Staging form: Breast, AJCC 8th Edition - Clinical: Stage IA (cT1, cN0, cM0, G3, ER+, PR+, HER2+) - Signed by Murleen Arms, MD on 09/10/2023 Stage prefix: Initial diagnosis Histologic grading system: 3 grade system   11/25/2023 -  Chemotherapy   Patient is on Treatment Plan : BREAST Trastuzumab  IV (8/6) or SQ (600) D1 q21d     11/26/2023 - 11/26/2023 Chemotherapy   Patient is on Treatment Plan : BREAST Paclitaxel + Trastuzumab  q7d / Trastuzumab  q21d     11/26/2023 - 11/26/2023 Chemotherapy   Patient is on Treatment Plan : BREAST Trastuzumab  SQ (600) D1 q21d      The patient, an 83 year old woman with a history of breast cancer, presents  with a new breast mass. This is her third occurrence of breast cancer, with previous diagnoses in 2000 and 2014.  Since her last visit here she had left breast lumpectomy, final pathology showed invasive ductal carcinoma measuring 1 x 0.8 x 0.5 cm, grade 2, DCIS focal solid type grade 2, negative margins for both invasive and DCIS.  All margins are negative for malignancy.  No lymph node specimen was submitted.  Prognostics from previous breast biopsy showed ER +100% strong PR +100% strong and HER2 positive by FISH with Ki-67 of 30%.   Discussed the use of AI scribe software for clinical note transcription with the patient, who gave verbal consent to proceed. History of Present Illness  Misty Wilkerson is an 83 year old female who presents with sleep disturbances and wheezing.  She experiences sleep disturbances characterized by waking up at 2:00 AM and being unable to return to sleep until 5:00 AM. This pattern is not consistent every night. She is currently taking melatonin 5 mg. Being retired, she has some flexibility with her sleep schedule.  She reports wheezing that occurs when she rolls onto her right side in bed, which started a couple of weeks ago. She had a cold prior to her last visit. She has a mild cough and needs to clear her throat occasionally. She has a family history of congestive heart failure, as her father passed away from it. She is concerned about this history but notes that the wheezing is not constant.  She has environmental allergies, particularly to dust, which is  prevalent in her home. She jokes about the abundance of dust and allergens in [place] , stating 'I'm not spending my time dusting.' No swelling is noted, and her bowels are moving okay.  Rest of the pertinent 10 point ROS reviewed and neg.   MEDICAL HISTORY:  Past Medical History:  Diagnosis Date   Allergy    Arthritis    Cancer (HCC)    left breast   H/O hiatal hernia    eats slowly  now    Hypertension    Personal history of radiation therapy 2002   left breast   Vertigo     SURGICAL HISTORY: Past Surgical History:  Procedure Laterality Date   BREAST BIOPSY Left 08/29/2023   US  LT BREAST BX W LOC DEV 1ST LESION IMG BX SPEC US  GUIDE 08/29/2023 [facility]   BREAST BIOPSY Left 09/27/2023   US  LT RADIOACTIVE SEED LOC 09/27/2023 [facility]   BREAST LUMPECTOMY Left 2002, 2014   BREAST LUMPECTOMY WITH RADIOACTIVE SEED LOCALIZATION Left 09/30/2023   Procedure: LEFT BREAST SEED GUIDED LUMPECTOMY;  Surgeon: Enid Harry, MD;  Location: [facility];  Service: General;  Laterality: Left;  LMA   CHOLECYSTECTOMY     COLONOSCOPY N/A 10/26/2015   Procedure: COLONOSCOPY;  Surgeon: Ruby Corporal, MD;  [facility];  Service: Endoscopy;  Laterality: N/A;  1200   DEBRIDEMENT AND CLOSURE WOUND Left 03/25/2018   Procedure: DEBRIDEMENTINTERPHALANGEAL LEFT INDEX FINGER;  Surgeon: Lyanne Sample, MD;  Location: [facility];  Service: Orthopedics;  Laterality: Left;   EXCISION MASS UPPER EXTREMETIES Left 03/25/2018   Procedure: EXCISION MASS LEFT INDEX;  Surgeon: Lyanne Sample, MD;  Location: [facility];  Service: Orthopedics;  Laterality: Left;   EYE SURGERY     bilateral cataracts   MAXILLARY ANTROSTOMY Left 12/25/2021   Procedure: LEFT MAXILLARY ANTROSTOMY WITH TISSUE REMOVAL;  Surgeon: Reynold Caves, MD;  Location:  SURGERY CENTER;  Service: ENT;  Laterality: Left;   PARTIAL MASTECTOMY WITH NEEDLE LOCALIZATION AND AXILLARY SENTINEL LYMPH NODE BX Left 08/12/2013   Procedure: LEFT PARTIAL MASTECTOMY WITH NEEDLE LOCALIZATION ATTEMPTED AXILLARY SENTINEL LYMPH NODE BIOPSY;  Surgeon: Enid Harry, MD;  Location: [facility];  Service: General;  Laterality: Left;   RE-EXCISION OF BREAST LUMPECTOMY Left 08/27/2013   Procedure: RE-EXCISION OF BREAST LUMPECTOMY;  Surgeon: Enid Harry, MD;  Location: [facility];  Service: General;   Laterality: Left;    SOCIAL HISTORY: Social History   Socioeconomic History   Marital status: Married    Spouse name: Athena Bland   Number of children: 2   Years of education: Not on file   Highest education level: Bachelor's degree (e.g., BA, AB, BS)  Occupational History   Occupation: retired  Tobacco Use   Smoking status: Former    Current packs/day: 0.00    Average packs/day: 0.5 packs/day for 15.0 years (7.5 ttl pk-yrs)    Types: Cigarettes    Start date: 07/21/1984    Quit date: 07/22/1999    Years since quitting: 24.7   Smokeless tobacco: Never  Vaping Use   Vaping status: Never Used  Substance and Sexual Activity   Alcohol use: Yes    Comment: one alcohol drink daily   Drug use: No   Sexual activity: Yes  Other Topics Concern   Not on file  Social History Narrative   Very independent and pretty active   Social Drivers of Health   Financial Resource Strain: Low Risk  (01/21/2024)  Overall Financial Resource Strain (CARDIA)    Difficulty of Paying Living Expenses: Not hard at all  Food Insecurity: No Food Insecurity (01/21/2024)   Hunger Vital Sign    Worried About Running Out of Food in the Last Year: Never true    Ran Out of Food in the Last Year: Never true  Transportation Needs: No Transportation Needs (01/21/2024)   PRAPARE - Administrator, Civil Service (Medical): No    Lack of Transportation (Non-Medical): No  Physical Activity: Sufficiently Active (01/21/2024)   Exercise Vital Sign    Days of Exercise per Week: 5 days    Minutes of Exercise per Session: 30 min  Stress: No Stress Concern Present (01/21/2024)   [employer] of Occupational Health - Occupational Stress Questionnaire    Feeling of Stress : Not at all  Social Connections: Socially Integrated (01/21/2024)   Social Connection and Isolation Panel [NHANES]    Frequency of Communication with Friends and Family: More than three times a week    Frequency of Social Gatherings with Friends  and Family: Twice a week    Attends Religious Services: More than 4 times per year    Active Member of [employer] or Organizations: Yes    Attends Banker Meetings: 1 to 4 times per year    Marital Status: Married  Catering manager Violence: Not At Risk (12/11/2023)   Humiliation, Afraid, Rape, and Kick questionnaire    Fear of Current or Ex-Partner: No    Emotionally Abused: No    Physically Abused: No    Sexually Abused: No    FAMILY HISTORY: Family History  Problem Relation Age of Onset   Hypertension Mother    Diabetes Father    Stroke Father    Breast cancer Sister 39   Breast cancer Sister    Aneurysm Sister    Cancer Brother    Heart disease Brother     ALLERGIES:  is allergic to penicillins and tetracyclines & related.  MEDICATIONS:  Current Outpatient Medications  Medication Sig Dispense Refill   cholecalciferol (VITAMIN D3) 25 MCG (1000 UNIT) tablet Take 2 tablets (2,000 Units total) by mouth daily.     letrozole  (FEMARA ) 2.5 MG tablet Take 1 tablet (2.5 mg total) by mouth daily. 90 tablet 3   Multiple Vitamin (MULTIVITAMIN) tablet Take 1 tablet by mouth daily.     triamterene -hydrochlorothiazide (MAXZIDE-25) 37.5-25 MG tablet TAKE 1/2 TABLET BY MOUTH DAILY 45 tablet 3   valsartan  (DIOVAN ) 160 MG tablet Take 1 tablet (160 mg total) by mouth daily. for blood pressure **NEEDS TO BE SEEN BEFORE NEXT REFILL** 30 tablet 0   vitamin B-12 (CYANOCOBALAMIN) 1000 MCG tablet Take 1,000 mcg by mouth daily.     No current facility-administered medications for this visit.   Facility-Administered Medications Ordered in Other Visits  Medication Dose Route Frequency Provider Last Rate Last Admin   0.9 %  sodium chloride  infusion   Intravenous Continuous Rockwell Zentz, MD 10 mL/hr at 04/21/24 1459 New Bag at 04/21/24 1459   trastuzumab -anns (KANJINTI ) 420 mg in sodium chloride  0.9 % 250 mL chemo infusion  6 mg/kg (Treatment Plan Recorded) Intravenous Once Kadia Abaya,  MD         PHYSICAL EXAMINATION: ECOG PERFORMANCE STATUS: 0 - Asymptomatic  Vitals:   04/21/24 1358 04/21/24 1359  BP: (!) 178/68 (!) 182/67  Pulse: 87   Resp: 18   Temp: 98.3 F (36.8 C)   SpO2: 99%  Filed Weights   04/21/24 1358  Weight: 163 lb 11.2 oz (74.3 kg)    GENERAL:alert, no distress and comfortable Chest: Clear to auscultation bilaterally Heart: Rate and rhythm regular Abdomen: Soft, nontender, nondistended No lower extremity edema  LABORATORY DATA:  I have reviewed the data as listed Lab Results  Component Value Date   WBC 3.5 01/21/2024   HGB 12.0 01/21/2024   HCT 37.1 01/21/2024   MCV 93 01/21/2024   PLT 198 01/21/2024   Lab Results  Component Value Date   NA 138 01/21/2024   K 4.3 01/21/2024   CL 100 01/21/2024   CO2 22 01/21/2024    RADIOGRAPHIC STUDIES: I have personally reviewed the radiological reports and agreed with the findings in the report.  ASSESSMENT AND PLAN:  Breast cancer of upper-inner quadrant of left female breast This is a 83 year old female patient with past medical history significant for left breast cancer in 2000, 2014 now presents with new onset left breast invasive ductal carcinoma, grade 3, ER/PR and HER2 amplified.  Patient has followed up with Dr. Delane Fear and the recommendation was to consider mastectomy given the HER2 amplified breast cancer but patient is strongly opposed to it.  She is not willing to consider mastectomy especially because she is worried about her quality of life.  She is now status post lumpectomy, final pathology showed grade 2 IDC measuring 1 x 0.8 x 0.5 cm, negative margins, no sentinel lymph nodes submitted. She did not want to consider adjuvant chemotherapy hence have discussed about subcutaneous/IV Herceptin  every 3 weeks for a whole year along with letrozole . Assessment & Plan Assessment and Plan Breast cancer, on adj letrozole  and herceptin , tolerating it well. No  concerns.  Wheezing Intermittent wheezing when rolling to the right side. Likely related to environmental allergies, possibly dust exposure. Unlikely related to congestive heart failure. - Contact provider for inhaler prescription if wheezing worsens. - No adv sounds on exam today  Sleep disturbance Intermittent waking at 2 AM, possibly related to letrozole . Current melatonin dose is 5 mg. - Increase melatonin dose and monitor. - Adjust sleep schedule to 10:30 PM. - We can consider prescription sleep aid if she doesn't improve with increased dose of melatonin.          All questions were answered. The patient knows to call the clinic with any problems, questions or concerns.    Murleen Arms, MD 04/21/24

## 2024-04-27 ENCOUNTER — Encounter: Payer: Self-pay | Admitting: *Deleted

## 2024-05-07 ENCOUNTER — Encounter: Payer: Self-pay | Admitting: *Deleted

## 2024-05-12 ENCOUNTER — Inpatient Hospital Stay: Attending: Hematology and Oncology

## 2024-05-12 VITALS — BP 150/67 | HR 80 | Temp 98.7°F | Resp 16 | Wt 162.0 lb

## 2024-05-12 DIAGNOSIS — C50412 Malignant neoplasm of upper-outer quadrant of left female breast: Secondary | ICD-10-CM | POA: Diagnosis not present

## 2024-05-12 DIAGNOSIS — Z170 Estrogen receptor positive status [ER+]: Secondary | ICD-10-CM

## 2024-05-12 DIAGNOSIS — Z5112 Encounter for antineoplastic immunotherapy: Secondary | ICD-10-CM | POA: Diagnosis not present

## 2024-05-12 MED ORDER — DIPHENHYDRAMINE HCL 25 MG PO CAPS
25.0000 mg | ORAL_CAPSULE | Freq: Once | ORAL | Status: AC
  Administered 2024-05-12: 25 mg via ORAL
  Filled 2024-05-12: qty 1

## 2024-05-12 MED ORDER — TRASTUZUMAB-ANNS CHEMO 150 MG IV SOLR
6.0000 mg/kg | Freq: Once | INTRAVENOUS | Status: AC
  Administered 2024-05-12: 420 mg via INTRAVENOUS
  Filled 2024-05-12: qty 20

## 2024-05-12 MED ORDER — SODIUM CHLORIDE 0.9 % IV SOLN: INTRAVENOUS | Status: DC

## 2024-05-12 MED ORDER — DIPHENHYDRAMINE HCL 25 MG PO CAPS: 50.0000 mg | ORAL_CAPSULE | Freq: Once | ORAL | Status: DC

## 2024-05-12 MED ORDER — ACETAMINOPHEN 325 MG PO TABS
650.0000 mg | ORAL_TABLET | Freq: Once | ORAL | Status: AC
  Administered 2024-05-12: 650 mg via ORAL
  Filled 2024-05-12: qty 2

## 2024-05-12 NOTE — Patient Instructions (Signed)
 [facility] - A DEPT OF [facility]  Discharge Instructions: Thank you for choosing [facility] to provide your oncology and hematology care.   If you have a lab appointment with the Cancer Center, please go directly to the Cancer Center and check in at the registration area.   Wear comfortable clothing and clothing appropriate for easy access to any Portacath or PICC line.   We strive to give you quality time with your provider. You may need to reschedule your appointment if you arrive late (15 or more minutes).  Arriving late affects you and other patients whose appointments are after yours.  Also, if you miss three or more appointments without notifying the office, you may be dismissed from the clinic at the provider's discretion.      For prescription refill requests, have your pharmacy contact our office and allow 72 hours for refills to be completed.    Today you received the following chemotherapy and/or immunotherapy agents: Kanjinti      To help prevent nausea and vomiting after your treatment, we encourage you to take your nausea medication as directed.  BELOW ARE SYMPTOMS THAT SHOULD BE REPORTED IMMEDIATELY: *FEVER GREATER THAN 100.4 F (38 C) OR HIGHER *CHILLS OR SWEATING *NAUSEA AND VOMITING THAT IS NOT CONTROLLED WITH YOUR NAUSEA MEDICATION *UNUSUAL SHORTNESS OF BREATH *UNUSUAL BRUISING OR BLEEDING *URINARY PROBLEMS (pain or burning when urinating, or frequent urination) *BOWEL PROBLEMS (unusual diarrhea, constipation, pain near the anus) TENDERNESS IN MOUTH AND THROAT WITH OR WITHOUT PRESENCE OF ULCERS (sore throat, sores in mouth, or a toothache) UNUSUAL RASH, SWELLING OR PAIN  UNUSUAL VAGINAL DISCHARGE OR ITCHING   Items with * indicate a potential emergency and should be followed up as soon as possible or go to the Emergency Department if any problems should occur.  Please show the CHEMOTHERAPY ALERT CARD or IMMUNOTHERAPY  ALERT CARD at check-in to the Emergency Department and triage nurse.  Should you have questions after your visit or need to cancel or reschedule your appointment, please contact [facility] - A DEPT [facility]  Dept: [phone]  and follow the prompts.  Office hours are 8:00 a.m. to 4:30 p.m. Monday - Friday. Please note that voicemails left after 4:00 p.m. may not be returned until the following business day.  We are closed weekends and major holidays. You have access to a nurse at all times for urgent questions. Please call the main number to the clinic Dept: [phone] and follow the prompts.   For any non-urgent questions, you may also contact your provider using MyChart. We now offer e-Visits for anyone 60 and older to request care online for non-urgent symptoms. For details visit [email or web address].   Also download the MyChart app! Go to the app store, search "MyChart", open the app, select [facility], and log in with your MyChart username and password.

## 2024-05-12 NOTE — Progress Notes (Signed)
 Decrease dose of Diphenhydramine  to 25 mg po x 1 prior to trastuzumab .  V.O. Dr Nonda Bays, PharmD

## 2024-05-13 ENCOUNTER — Encounter: Payer: Self-pay | Admitting: Hematology and Oncology

## 2024-05-22 ENCOUNTER — Encounter: Payer: Self-pay | Admitting: Hematology and Oncology

## 2024-05-28 ENCOUNTER — Other Ambulatory Visit: Payer: Self-pay | Admitting: Family Medicine

## 2024-06-02 ENCOUNTER — Inpatient Hospital Stay: Admitting: Adult Health

## 2024-06-02 ENCOUNTER — Inpatient Hospital Stay: Attending: Hematology and Oncology

## 2024-06-02 ENCOUNTER — Inpatient Hospital Stay

## 2024-06-02 VITALS — BP 172/58 | HR 84 | Temp 98.6°F | Resp 18 | Wt 160.5 lb

## 2024-06-02 DIAGNOSIS — Z09 Encounter for follow-up examination after completed treatment for conditions other than malignant neoplasm: Secondary | ICD-10-CM

## 2024-06-02 DIAGNOSIS — Z170 Estrogen receptor positive status [ER+]: Secondary | ICD-10-CM | POA: Diagnosis not present

## 2024-06-02 DIAGNOSIS — C50412 Malignant neoplasm of upper-outer quadrant of left female breast: Secondary | ICD-10-CM | POA: Insufficient documentation

## 2024-06-02 DIAGNOSIS — M85851 Other specified disorders of bone density and structure, right thigh: Secondary | ICD-10-CM | POA: Insufficient documentation

## 2024-06-02 DIAGNOSIS — L299 Pruritus, unspecified: Secondary | ICD-10-CM | POA: Diagnosis not present

## 2024-06-02 DIAGNOSIS — I10 Essential (primary) hypertension: Secondary | ICD-10-CM | POA: Diagnosis not present

## 2024-06-02 DIAGNOSIS — Z79811 Long term (current) use of aromatase inhibitors: Secondary | ICD-10-CM | POA: Insufficient documentation

## 2024-06-02 DIAGNOSIS — G4700 Insomnia, unspecified: Secondary | ICD-10-CM | POA: Diagnosis not present

## 2024-06-02 DIAGNOSIS — E7800 Pure hypercholesterolemia, unspecified: Secondary | ICD-10-CM | POA: Insufficient documentation

## 2024-06-02 DIAGNOSIS — Z5112 Encounter for antineoplastic immunotherapy: Secondary | ICD-10-CM | POA: Insufficient documentation

## 2024-06-02 LAB — CMP (CANCER CENTER ONLY)
ALT: 15 U/L (ref 0–44)
AST: 19 U/L (ref 15–41)
Albumin: 4.2 g/dL (ref 3.5–5.0)
Alkaline Phosphatase: 60 U/L (ref 38–126)
Anion gap: 6 (ref 5–15)
BUN: 25 mg/dL — ABNORMAL HIGH (ref 8–23)
CO2: 23 mmol/L (ref 22–32)
Calcium: 9.5 mg/dL (ref 8.9–10.3)
Chloride: 105 mmol/L (ref 98–111)
Creatinine: 1.41 mg/dL — ABNORMAL HIGH (ref 0.44–1.00)
GFR, Estimated: 37 mL/min — ABNORMAL LOW (ref >60)
Glucose, Bld: 135 mg/dL — ABNORMAL HIGH (ref 70–99)
Potassium: 4.7 mmol/L (ref 3.5–5.1)
Sodium: 134 mmol/L — ABNORMAL LOW (ref 135–145)
Total Bilirubin: 0.4 mg/dL (ref 0.0–1.2)
Total Protein: 7 g/dL (ref 6.5–8.1)

## 2024-06-02 LAB — CBC WITH DIFFERENTIAL (CANCER CENTER ONLY)
Abs Immature Granulocytes: 0.02 K/uL (ref 0.00–0.07)
Basophils Absolute: 0 K/uL (ref 0.0–0.1)
Basophils Relative: 0 %
Eosinophils Absolute: 0.4 K/uL (ref 0.0–0.5)
Eosinophils Relative: 8 %
HCT: 34.7 % — ABNORMAL LOW (ref 36.0–46.0)
Hemoglobin: 11.9 g/dL — ABNORMAL LOW (ref 12.0–15.0)
Immature Granulocytes: 0 %
Lymphocytes Relative: 19 %
Lymphs Abs: 0.9 K/uL (ref 0.7–4.0)
MCH: 30.8 pg (ref 26.0–34.0)
MCHC: 34.3 g/dL (ref 30.0–36.0)
MCV: 89.9 fL (ref 80.0–100.0)
Monocytes Absolute: 0.3 K/uL (ref 0.1–1.0)
Monocytes Relative: 6 %
Neutro Abs: 3.3 K/uL (ref 1.7–7.7)
Neutrophils Relative %: 67 %
Platelet Count: 230 K/uL (ref 150–400)
RBC: 3.86 MIL/uL — ABNORMAL LOW (ref 3.87–5.11)
RDW: 12.1 % (ref 11.5–15.5)
WBC Count: 4.9 K/uL (ref 4.0–10.5)
nRBC: 0 % (ref 0.0–0.2)

## 2024-06-02 MED ORDER — TRASTUZUMAB-ANNS CHEMO 150 MG IV SOLR
6.0000 mg/kg | Freq: Once | INTRAVENOUS | Status: AC
  Administered 2024-06-02: 420 mg via INTRAVENOUS
  Filled 2024-06-02: qty 20

## 2024-06-02 MED ORDER — METHYLPREDNISOLONE SODIUM SUCC 40 MG IJ SOLR
40.0000 mg | Freq: Once | INTRAMUSCULAR | Status: AC
  Administered 2024-06-02: 40 mg via INTRAVENOUS
  Filled 2024-06-02: qty 1

## 2024-06-02 MED ORDER — DIPHENHYDRAMINE HCL 25 MG PO CAPS
25.0000 mg | ORAL_CAPSULE | Freq: Once | ORAL | Status: AC
  Administered 2024-06-02: 25 mg via ORAL
  Filled 2024-06-02: qty 1

## 2024-06-02 MED ORDER — SODIUM CHLORIDE 0.9 % IV SOLN: INTRAVENOUS | Status: DC

## 2024-06-02 MED ORDER — ACETAMINOPHEN 325 MG PO TABS
650.0000 mg | ORAL_TABLET | Freq: Once | ORAL | Status: AC
Start: 2024-06-02 — End: 2024-06-02
  Administered 2024-06-02: 650 mg via ORAL
  Filled 2024-06-02: qty 2

## 2024-06-02 NOTE — Patient Instructions (Signed)

## 2024-06-02 NOTE — Progress Notes (Unsigned)
 [facility] Cancer Follow up:    Misty Lowers, MD [address] [place] [address]   DIAGNOSIS: Cancer Staging  Breast cancer of upper-inner quadrant of left female breast [facility]) Staging form: Breast, AJCC 7th Edition - Pathologic: Stage IB (T1a, N0, cM0) - Signed by Fernand Sharma POUR, MD on 12/15/2013 Diagnostic confirmation: Positive histology Histopathologic type: 9931 Laterality: Left Tumor size (mm): 5  Breast cancer of upper-outer quadrant of left female breast (HCC) Staging form: Breast, AJCC 8th Edition - Clinical: Stage IA (cT1, cN0, cM0, G3, ER+, PR+, HER2+) - Signed by Loretha Ash, MD on 09/10/2023 Stage prefix: Initial diagnosis Histologic grading system: 3 grade system    SUMMARY OF ONCOLOGIC HISTORY: Oncology History  Breast cancer of upper-inner quadrant of left female breast (HCC)  12/08/1998 Initial Biopsy   Left breast cancer: Lumpectomy followed by radiation T1 BN 0M0 could not tolerate tamoxifen or alternative therapies.   08/12/2013 Surgery   Left breast lumpectomy: Tumor 1  invasive ductal carcinoma 0.4 cm with DCIS, tumor to: IDC 1.2 cm with DCIS positive margin, ER 98%, PR 14%, HER-2 negative, Ki-67 19% margins reexcision negative   09/07/2013 -  Anti-estrogen oral therapy   Letrozole  2.5 mg daily plans for 5 years   Breast cancer of upper-outer quadrant of left female breast (HCC)  09/10/2023 Initial Diagnosis   Breast cancer of upper-outer quadrant of left female breast (HCC)   09/10/2023 Cancer Staging   Staging form: Breast, AJCC 8th Edition - Clinical: Stage IA (cT1, cN0, cM0, G3, ER+, PR+, HER2+) - Signed by Loretha Ash, MD on 09/10/2023 Stage prefix: Initial diagnosis Histologic grading system: 3 grade system   11/25/2023 -  Chemotherapy   Patient is on Treatment Plan : BREAST Trastuzumab  IV (8/6) or SQ (600) D1 q21d     11/26/2023 - 11/26/2023 Chemotherapy   Patient is on Treatment Plan : BREAST Paclitaxel +  Trastuzumab  q7d / Trastuzumab  q21d     11/26/2023 - 11/26/2023 Chemotherapy   Patient is on Treatment Plan : BREAST Trastuzumab  SQ (600) D1 q21d       CURRENT THERAPY: letrozole    INTERVAL HISTORY:  Discussed the use of AI scribe software for clinical note transcription with the patient, who gave verbal consent to proceed.  Misty Wilkerson 83 y.o. female returns for    Patient Active Problem List   Diagnosis Date Noted   Chronic rhinitis 02/20/2024   Hypertrophy of nasal turbinates 02/20/2024   Postnasal drip 02/20/2024   Breast cancer of upper-outer quadrant of left female breast (HCC) 09/10/2023   Age related osteoporosis 12/06/2021   Benign essential hypertension 12/06/2021   BMI 30.0-30.9,adult 12/06/2021   Hx of adenomatous colonic polyps 12/06/2021   Hx of malignant neoplasm of breast 12/06/2021   Pure hypercholesterolemia 12/06/2021   Vitamin D  deficiency 12/06/2021   Mass 05/06/2017   Osteoarthritis of finger of left hand 05/06/2017   Pain 05/06/2017   Breast cancer of upper-inner quadrant of left female breast (HCC) 09/01/2013    is allergic to penicillins and tetracyclines & related.  MEDICAL HISTORY: Past Medical History:  Diagnosis Date   Allergy    Arthritis    Cancer (HCC)    left breast   H/O hiatal hernia    eats slowly  now   Hypertension    Personal history of radiation therapy 2002   left breast   Vertigo     SURGICAL HISTORY: Past Surgical History:  Procedure Laterality Date   BREAST  BIOPSY Left 08/29/2023   US  LT BREAST BX W LOC DEV 1ST LESION IMG BX SPEC US  GUIDE 08/29/2023 [facility]   BREAST BIOPSY Left 09/27/2023   US  LT RADIOACTIVE SEED LOC 09/27/2023 [facility]   BREAST LUMPECTOMY Left 2002, 2014   BREAST LUMPECTOMY WITH RADIOACTIVE SEED LOCALIZATION Left 09/30/2023   Procedure: LEFT BREAST SEED GUIDED LUMPECTOMY;  Surgeon: Ebbie Cough, MD;  Location: [facility];  Service: General;  Laterality:  Left;  LMA   CHOLECYSTECTOMY     COLONOSCOPY N/A 10/26/2015   Procedure: COLONOSCOPY;  Surgeon: Claudis RAYMOND Rivet, MD;  [facility];  Service: Endoscopy;  Laterality: N/A;  1200   DEBRIDEMENT AND CLOSURE WOUND Left 03/25/2018   Procedure: DEBRIDEMENTINTERPHALANGEAL LEFT INDEX FINGER;  Surgeon: Murrell Kuba, MD;  Location: [facility];  Service: Orthopedics;  Laterality: Left;   EXCISION MASS UPPER EXTREMETIES Left 03/25/2018   Procedure: EXCISION MASS LEFT INDEX;  Surgeon: Murrell Kuba, MD;  Location: [facility];  Service: Orthopedics;  Laterality: Left;   EYE SURGERY     bilateral cataracts   MAXILLARY ANTROSTOMY Left 12/25/2021   Procedure: LEFT MAXILLARY ANTROSTOMY WITH TISSUE REMOVAL;  Surgeon: Karis Clunes, MD;  Location: [facility];  Service: ENT;  Laterality: Left;   PARTIAL MASTECTOMY WITH NEEDLE LOCALIZATION AND AXILLARY SENTINEL LYMPH NODE BX Left 08/12/2013   Procedure: LEFT PARTIAL MASTECTOMY WITH NEEDLE LOCALIZATION ATTEMPTED AXILLARY SENTINEL LYMPH NODE BIOPSY;  Surgeon: Cough Ebbie, MD;  Location: [facility];  Service: General;  Laterality: Left;   RE-EXCISION OF BREAST LUMPECTOMY Left 08/27/2013   Procedure: RE-EXCISION OF BREAST LUMPECTOMY;  Surgeon: Cough Ebbie, MD;  Location: [facility];  Service: General;  Laterality: Left;    SOCIAL HISTORY: Social History   Socioeconomic History   Marital status: Married    Spouse name: Garrel   Number of children: 2   Years of education: Not on file   Highest education level: Bachelor's degree (e.g., BA, AB, BS)  Occupational History   Occupation: retired  Tobacco Use   Smoking status: Former    Current packs/day: 0.00    Average packs/day: 0.5 packs/day for 15.0 years (7.5 ttl pk-yrs)    Types: Cigarettes    Start date: 07/21/1984    Quit date: 07/22/1999    Years since quitting: 24.8   Smokeless tobacco: Never  Vaping Use   Vaping status: Never Used  Substance and Sexual  Activity   Alcohol use: Yes    Comment: one alcohol drink daily   Drug use: No   Sexual activity: Yes  Other Topics Concern   Not on file  Social History Narrative   Very independent and pretty active   Social Drivers of Corporate investment banker Strain: Low Risk  (01/21/2024)   Overall Financial Resource Strain (CARDIA)    Difficulty of Paying Living Expenses: Not hard at all  Food Insecurity: No Food Insecurity (01/21/2024)   Hunger Vital Sign    Worried About Running Out of Food in the Last Year: Never true    Ran Out of Food in the Last Year: Never true  Transportation Needs: No Transportation Needs (01/21/2024)   PRAPARE - Administrator, Civil Service (Medical): No    Lack of Transportation (Non-Medical): No  Physical Activity: Sufficiently Active (01/21/2024)   Exercise Vital Sign    Days of Exercise per Week: 5 days    Minutes of Exercise per Session: 30 min  Stress:  No Stress Concern Present (01/21/2024)   [employer] of Occupational Health - Occupational Stress Questionnaire    Feeling of Stress : Not at all  Social Connections: Socially Integrated (01/21/2024)   Social Connection and Isolation Panel    Frequency of Communication with Friends and Family: More than three times a week    Frequency of Social Gatherings with Friends and Family: Twice a week    Attends Religious Services: More than 4 times per year    Active Member of Clubs or Organizations: Yes    Attends Banker Meetings: 1 to 4 times per year    Marital Status: Married  Catering manager Violence: Not At Risk (12/11/2023)   Humiliation, Afraid, Rape, and Kick questionnaire    Fear of Current or Ex-Partner: No    Emotionally Abused: No    Physically Abused: No    Sexually Abused: No    FAMILY HISTORY: Family History  Problem Relation Age of Onset   Hypertension Mother    Diabetes Father    Stroke Father    Breast cancer Sister 39   Breast cancer Sister    Aneurysm  Sister    Cancer Brother    Heart disease Brother     Review of Systems - Oncology    PHYSICAL EXAMINATION   Onc Performance Status - 06/02/24 1322       ECOG Perf Status   ECOG Perf Status Restricted in physically strenuous activity but ambulatory and able to carry out work of a light or sedentary nature, e.g., light house work, office work      KPS SCALE   KPS % SCORE Able to carry on normal activity, minor s/s of disease          Vitals:   06/02/24 1324  BP: (!) 172/58  Pulse: 84  Resp: 18  Temp: 98.6 F (37 C)  SpO2: 100%    Physical Exam  LABORATORY DATA:  CBC    Component Value Date/Time   WBC 3.5 01/21/2024 1606   WBC 5.8 09/19/2021 2239   RBC 3.99 01/21/2024 1606   RBC 3.72 (L) 09/19/2021 2239   HGB 12.0 01/21/2024 1606   HGB 13.4 12/08/2014 1320   HCT 37.1 01/21/2024 1606   HCT 41.8 12/08/2014 1320   PLT 198 01/21/2024 1606   MCV 93 01/21/2024 1606   MCV 94.1 12/08/2014 1320   MCH 30.1 01/21/2024 1606   MCH 32.3 09/19/2021 2239   MCHC 32.3 01/21/2024 1606   MCHC 33.7 09/19/2021 2239   RDW 11.8 01/21/2024 1606   RDW 13.0 12/08/2014 1320   LYMPHSABS 0.6 (L) 01/21/2024 1606   LYMPHSABS 1.2 12/08/2014 1320   MONOABS 0.5 09/19/2021 2239   MONOABS 0.4 12/08/2014 1320   EOSABS 0.2 01/21/2024 1606   BASOSABS 0.0 01/21/2024 1606   BASOSABS 0.0 12/08/2014 1320    CMP     Component Value Date/Time   NA 138 01/21/2024 1606   NA 137 12/08/2014 1320   K 4.3 01/21/2024 1606   K 4.1 12/08/2014 1320   CL 100 01/21/2024 1606   CO2 22 01/21/2024 1606   CO2 27 12/08/2014 1320   GLUCOSE 93 01/21/2024 1606   GLUCOSE 103 (H) 09/24/2023 1418   GLUCOSE 91 12/08/2014 1320   BUN 8 01/21/2024 1606   BUN 22.4 12/08/2014 1320   CREATININE 1.09 (H) 01/21/2024 1606   CREATININE 1.2 (H) 12/08/2014 1320   CALCIUM 9.3 01/21/2024 1606   CALCIUM 9.5 12/08/2014 1320  PROT 7.2 01/21/2024 1606   PROT 7.8 12/08/2014 1320   ALBUMIN 4.4 01/21/2024 1606   ALBUMIN  4.4 12/08/2014 1320   AST 23 01/21/2024 1606   AST 25 12/08/2014 1320   ALT 20 01/21/2024 1606   ALT 22 12/08/2014 1320   ALKPHOS 73 01/21/2024 1606   ALKPHOS 71 12/08/2014 1320   BILITOT 0.4 01/21/2024 1606   BILITOT 0.52 12/08/2014 1320   GFRNONAA 38 (L) 09/24/2023 1418   GFRAA 57 (L) 03/24/2018 1508     ASSESSMENT and THERAPY PLAN:   No problem-specific Assessment & Plan notes found for this encounter.     All questions were answered. The patient knows to call the clinic with any problems, questions or concerns. We can certainly see the patient much sooner if necessary.  Total encounter time:*** minutes*in face-to-face visit time, chart review, lab review, care coordination, order entry, and documentation of the encounter time.    Morna Kendall, NP 06/02/24 1:33 PM Medical Oncology and Hematology [facility] [address] [place], [place] [address] Tel. [phone]    Fax. [phone]  *Total Encounter Time as defined by the Centers for Medicare and Medicaid Services includes, in addition to the face-to-face time of a patient visit (documented in the note above) non-face-to-face time: obtaining and reviewing outside history, ordering and reviewing medications, tests or procedures, care coordination (communications with other health care professionals or caregivers) and documentation in the medical record.

## 2024-06-04 ENCOUNTER — Encounter (HOSPITAL_COMMUNITY): Payer: Self-pay | Admitting: Emergency Medicine

## 2024-06-04 ENCOUNTER — Other Ambulatory Visit: Payer: Self-pay

## 2024-06-04 ENCOUNTER — Emergency Department (HOSPITAL_COMMUNITY)
Admission: EM | Admit: 2024-06-04 | Discharge: 2024-06-04 | Disposition: A | Discharge status: Home / Self Care | Attending: Emergency Medicine | Admitting: Emergency Medicine

## 2024-06-04 ENCOUNTER — Encounter: Payer: Self-pay | Admitting: Hematology and Oncology

## 2024-06-04 ENCOUNTER — Ambulatory Visit: Payer: Self-pay | Admitting: *Deleted

## 2024-06-04 DIAGNOSIS — Z87891 Personal history of nicotine dependence: Secondary | ICD-10-CM | POA: Insufficient documentation

## 2024-06-04 DIAGNOSIS — Z853 Personal history of malignant neoplasm of breast: Secondary | ICD-10-CM | POA: Diagnosis not present

## 2024-06-04 DIAGNOSIS — I10 Essential (primary) hypertension: Secondary | ICD-10-CM | POA: Insufficient documentation

## 2024-06-04 NOTE — Discharge Instructions (Signed)
 You were evaluated in the Emergency Department and after careful evaluation, we did not find any emergent condition requiring admission or further testing in the hospital.  Your exam/testing today is overall reassuring.  Recommend close follow-up with your primary care doctor to discuss your blood pressure management.  Please return to the Emergency Department if you experience any worsening of your condition.   Thank you for allowing us  to be a part of your care.

## 2024-06-04 NOTE — Telephone Encounter (Signed)
-----   Message from Morna JAYSON Kendall sent at 06/03/2024  7:17 PM EDT ----- Kidney function is increased, please let patient know that she should increase her water  intake ----- Message ----- From: Interface, Lab In [place] Sent: 06/02/2024   3:18 PM EDT To: Morna Dalton Kendall, NP

## 2024-06-04 NOTE — ED Provider Notes (Signed)
 [facility] Emergency Department Provider Note MRN:  [id number]  Arrival date & time: 06/04/24     Chief Complaint   Hypertension   History of Present Illness   Misty Wilkerson is a 83 y.o. year-old female with a history of breast cancer presenting to the ED with chief complaint of hypertension.  High blood pressure this evening.  Explains that she feels like she can tell when her blood pressure is high.  Feels a fullness to her head and/or face.  Checked and the blood pressure was 200 systolic which worried her.  Denies headache, no vision change, no chest pain, no shortness of breath, no numbness or weakness to the arms or legs.  Had a steroid shot 2 days ago.  Receives infusions for her breast cancer.  Review of Systems  A thorough review of systems was obtained and all systems are negative except as noted in the HPI and PMH.   Patient's Health History    Past Medical History:  Diagnosis Date   Allergy    Arthritis    Cancer (HCC)    left breast   H/O hiatal hernia    eats slowly  now   Hypertension    Personal history of radiation therapy 2002   left breast   Vertigo     Past Surgical History:  Procedure Laterality Date   BREAST BIOPSY Left 08/29/2023   US  LT BREAST BX W LOC DEV 1ST LESION IMG BX SPEC US  GUIDE 08/29/2023 [facility]   BREAST BIOPSY Left 09/27/2023   US  LT RADIOACTIVE SEED LOC 09/27/2023 [facility]   BREAST LUMPECTOMY Left 2002, 2014   BREAST LUMPECTOMY WITH RADIOACTIVE SEED LOCALIZATION Left 09/30/2023   Procedure: LEFT BREAST SEED GUIDED LUMPECTOMY;  Surgeon: Ebbie Cough, MD;  Location: [facility];  Service: General;  Laterality: Left;  LMA   CHOLECYSTECTOMY     COLONOSCOPY N/A 10/26/2015   Procedure: COLONOSCOPY;  Surgeon: Claudis RAYMOND Rivet, MD;  [facility];  Service: Endoscopy;  Laterality: N/A;  1200   DEBRIDEMENT AND CLOSURE WOUND Left 03/25/2018   Procedure:  DEBRIDEMENTINTERPHALANGEAL LEFT INDEX FINGER;  Surgeon: Murrell Kuba, MD;  Location: [facility];  Service: Orthopedics;  Laterality: Left;   EXCISION MASS UPPER EXTREMETIES Left 03/25/2018   Procedure: EXCISION MASS LEFT INDEX;  Surgeon: Murrell Kuba, MD;  Location: [facility];  Service: Orthopedics;  Laterality: Left;   EYE SURGERY     bilateral cataracts   MAXILLARY ANTROSTOMY Left 12/25/2021   Procedure: LEFT MAXILLARY ANTROSTOMY WITH TISSUE REMOVAL;  Surgeon: Karis Clunes, MD;  Location: [facility];  Service: ENT;  Laterality: Left;   PARTIAL MASTECTOMY WITH NEEDLE LOCALIZATION AND AXILLARY SENTINEL LYMPH NODE BX Left 08/12/2013   Procedure: LEFT PARTIAL MASTECTOMY WITH NEEDLE LOCALIZATION ATTEMPTED AXILLARY SENTINEL LYMPH NODE BIOPSY;  Surgeon: Cough Ebbie, MD;  Location: [facility];  Service: General;  Laterality: Left;   RE-EXCISION OF BREAST LUMPECTOMY Left 08/27/2013   Procedure: RE-EXCISION OF BREAST LUMPECTOMY;  Surgeon: Cough Ebbie, MD;  Location: [facility];  Service: General;  Laterality: Left;    Family History  Problem Relation Age of Onset   Hypertension Mother    Diabetes Father    Stroke Father    Breast cancer Sister 54   Breast cancer Sister    Aneurysm Sister    Cancer Brother    Heart disease Brother     Social History   Socioeconomic History   Marital  status: Married    Spouse name: Garrel   Number of children: 2   Years of education: Not on file   Highest education level: Bachelor's degree (e.g., BA, AB, BS)  Occupational History   Occupation: retired  Tobacco Use   Smoking status: Former    Current packs/day: 0.00    Average packs/day: 0.5 packs/day for 15.0 years (7.5 ttl pk-yrs)    Types: Cigarettes    Start date: 07/21/1984    Quit date: 07/22/1999    Years since quitting: 24.8   Smokeless tobacco: Never  Vaping Use   Vaping status: Never Used  Substance and Sexual Activity   Alcohol use: Yes    Comment: one  alcohol drink daily   Drug use: No   Sexual activity: Yes  Other Topics Concern   Not on file  Social History Narrative   Very independent and pretty active   Social Drivers of Corporate investment banker Strain: Low Risk  (01/21/2024)   Overall Financial Resource Strain (CARDIA)    Difficulty of Paying Living Expenses: Not hard at all  Food Insecurity: No Food Insecurity (01/21/2024)   Hunger Vital Sign    Worried About Running Out of Food in the Last Year: Never true    Ran Out of Food in the Last Year: Never true  Transportation Needs: No Transportation Needs (01/21/2024)   PRAPARE - Administrator, Civil Service (Medical): No    Lack of Transportation (Non-Medical): No  Physical Activity: Sufficiently Active (01/21/2024)   Exercise Vital Sign    Days of Exercise per Week: 5 days    Minutes of Exercise per Session: 30 min  Stress: No Stress Concern Present (01/21/2024)   [employer] of Occupational Health - Occupational Stress Questionnaire    Feeling of Stress : Not at all  Social Connections: Socially Integrated (01/21/2024)   Social Connection and Isolation Panel    Frequency of Communication with Friends and Family: More than three times a week    Frequency of Social Gatherings with Friends and Family: Twice a week    Attends Religious Services: More than 4 times per year    Active Member of [employer] or Organizations: Yes    Attends Banker Meetings: 1 to 4 times per year    Marital Status: Married  Catering manager Violence: Not At Risk (12/11/2023)   Humiliation, Afraid, Rape, and Kick questionnaire    Fear of Current or Ex-Partner: No    Emotionally Abused: No    Physically Abused: No    Sexually Abused: No     Physical Exam   Vitals:   06/04/24 2033 06/04/24 2326  BP: (!) 190/75 (!) 172/66  Pulse: 93 73  Resp: 20 17  Temp: (!) 97.5 F (36.4 C)   SpO2: 99% 100%    CONSTITUTIONAL: Well-appearing, NAD NEURO/PSYCH:  Alert and oriented  x 3, normal and symmetric strength and sensation, normal coordination, normal speech EYES:  eyes equal and reactive ENT/NECK:  no LAD, no JVD CARDIO: Regular rate, well-perfused, normal S1 and S2 PULM:  CTAB no wheezing or rhonchi GI/GU:  non-distended, non-tender MSK/SPINE:  No gross deformities, no edema SKIN:  no rash, atraumatic   *Additional and/or pertinent findings included in MDM below  Diagnostic and Interventional Summary    EKG Interpretation Date/Time:    Ventricular Rate:    PR Interval:    QRS Duration:    QT Interval:    QTC Calculation:   R  Axis:      Text Interpretation:         Labs Reviewed - No data to display  No orders to display    Medications - No data to display   Procedures  /  Critical Care Procedures  ED Course and Medical Decision Making  Initial Impression and Ddx Patient is well-appearing in no acute distress, no symptoms currently.  Took a Valium prior to arrival, feeling much more calm.  Triage systolic BP 190s, has improved to 170s without intervention.  Per chart review, has had some slightly uptrending blood pressures over the past several months.  Renal function slightly worse than prior according to labs 2 days ago but nothing major.  Nothing to suggest hypertensive crisis at this time.  Past medical/surgical history that increases complexity of ED encounter: Breast cancer  Interpretation of Diagnostics Laboratory and/or imaging options to aid in the diagnosis/care of the patient were considered.  After careful history and physical examination, it was determined that there was no indication for diagnostics at this time.  Patient Reassessment and Ultimate Disposition/Management     Discharged with reassurance.  Patient management required discussion with the following services or consulting groups:  None  Complexity of Problems Addressed Acute complicated illness or Injury  Additional Data Reviewed and Analyzed Further  history obtained from: Further history from spouse/family member  Additional Factors Impacting ED Encounter Risk None  Ozell HERO. Theadore, MD [facility] Emergency Medicine [facility] [email or web address]  Final Clinical Impressions(s) / ED Diagnoses     ICD-10-CM   1. Hypertension, unspecified type  I10       ED Discharge Orders     None        Discharge Instructions Discussed with and Provided to Patient:    Discharge Instructions      You were evaluated in the Emergency Department and after careful evaluation, we did not find any emergent condition requiring admission or further testing in the hospital.  Your exam/testing today is overall reassuring.  Recommend close follow-up with your primary care doctor to discuss your blood pressure management.  Please return to the Emergency Department if you experience any worsening of your condition.   Thank you for allowing us  to be a part of your care.      Theadore Ozell HERO, MD 06/04/24 [phone]

## 2024-06-04 NOTE — Telephone Encounter (Signed)
Per Lindsey Causey, NP, called pt with message below. Pt verbalized understanding.  

## 2024-06-04 NOTE — ED Triage Notes (Addendum)
 Pt c/o hypertension with a head tightness. Pt states that BP was 200/100s. Pt had antibody and steroid infusion on Tuesday at [facility]. Pt took a 2mg  valium 1 hour pta.

## 2024-06-11 ENCOUNTER — Ambulatory Visit (HOSPITAL_COMMUNITY)
Admission: RE | Admit: 2024-06-11 | Discharge: 2024-06-11 | Disposition: A | Discharge status: Home / Self Care | Source: Ambulatory Visit | Attending: Adult Health | Admitting: Adult Health

## 2024-06-11 DIAGNOSIS — Z170 Estrogen receptor positive status [ER+]: Secondary | ICD-10-CM | POA: Insufficient documentation

## 2024-06-11 DIAGNOSIS — I340 Nonrheumatic mitral (valve) insufficiency: Secondary | ICD-10-CM | POA: Insufficient documentation

## 2024-06-11 DIAGNOSIS — I10 Essential (primary) hypertension: Secondary | ICD-10-CM | POA: Diagnosis not present

## 2024-06-11 DIAGNOSIS — I371 Nonrheumatic pulmonary valve insufficiency: Secondary | ICD-10-CM | POA: Diagnosis not present

## 2024-06-11 DIAGNOSIS — Z0189 Encounter for other specified special examinations: Secondary | ICD-10-CM | POA: Diagnosis not present

## 2024-06-11 DIAGNOSIS — C50412 Malignant neoplasm of upper-outer quadrant of left female breast: Secondary | ICD-10-CM | POA: Diagnosis not present

## 2024-06-11 DIAGNOSIS — Z09 Encounter for follow-up examination after completed treatment for conditions other than malignant neoplasm: Secondary | ICD-10-CM | POA: Diagnosis not present

## 2024-06-11 LAB — ECHOCARDIOGRAM COMPLETE
Area-P 1/2: 3.17 cm2
S' Lateral: 2.8 cm

## 2024-06-11 NOTE — Progress Notes (Signed)
*  PRELIMINARY RESULTS* Echocardiogram 2D Echocardiogram has been performed.  Misty Wilkerson 06/11/2024, 4:15 PM

## 2024-06-22 ENCOUNTER — Telehealth: Payer: Self-pay

## 2024-06-22 NOTE — Telephone Encounter (Signed)
 Pt verbally confirmed appts for 7/29

## 2024-06-23 ENCOUNTER — Inpatient Hospital Stay

## 2024-06-23 ENCOUNTER — Inpatient Hospital Stay: Admitting: Hematology and Oncology

## 2024-06-23 VITALS — BP 116/56 | HR 76 | Temp 98.0°F | Resp 16

## 2024-06-23 VITALS — BP 145/46 | HR 88 | Temp 98.6°F | Resp 18 | Wt 162.3 lb

## 2024-06-23 DIAGNOSIS — Z170 Estrogen receptor positive status [ER+]: Secondary | ICD-10-CM | POA: Diagnosis not present

## 2024-06-23 DIAGNOSIS — M85851 Other specified disorders of bone density and structure, right thigh: Secondary | ICD-10-CM | POA: Diagnosis not present

## 2024-06-23 DIAGNOSIS — G4700 Insomnia, unspecified: Secondary | ICD-10-CM | POA: Diagnosis not present

## 2024-06-23 DIAGNOSIS — Z79811 Long term (current) use of aromatase inhibitors: Secondary | ICD-10-CM | POA: Diagnosis not present

## 2024-06-23 DIAGNOSIS — I10 Essential (primary) hypertension: Secondary | ICD-10-CM | POA: Diagnosis not present

## 2024-06-23 DIAGNOSIS — C50412 Malignant neoplasm of upper-outer quadrant of left female breast: Secondary | ICD-10-CM

## 2024-06-23 DIAGNOSIS — E7800 Pure hypercholesterolemia, unspecified: Secondary | ICD-10-CM | POA: Diagnosis not present

## 2024-06-23 DIAGNOSIS — L299 Pruritus, unspecified: Secondary | ICD-10-CM | POA: Diagnosis not present

## 2024-06-23 DIAGNOSIS — Z5112 Encounter for antineoplastic immunotherapy: Secondary | ICD-10-CM | POA: Diagnosis not present

## 2024-06-23 MED ORDER — TRASTUZUMAB-ANNS CHEMO 150 MG IV SOLR
6.0000 mg/kg | Freq: Once | INTRAVENOUS | Status: AC
  Administered 2024-06-23: 420 mg via INTRAVENOUS
  Filled 2024-06-23: qty 20

## 2024-06-23 MED ORDER — SODIUM CHLORIDE 0.9 % IV SOLN: INTRAVENOUS | Status: DC

## 2024-06-23 MED ORDER — ZOLPIDEM TARTRATE 5 MG PO TABS: 5.0000 mg | ORAL_TABLET | Freq: Every evening | ORAL | 1 refills | Status: DC | PRN

## 2024-06-23 NOTE — Patient Instructions (Signed)

## 2024-06-23 NOTE — Progress Notes (Signed)
 [facility] CONSULT NOTE  Patient Care Team: Zollie Lowers, MD as PCP - General (Family Medicine) Glean Stephane BROCKS, RN (Inactive) as Oncology Nurse Navigator Tyree Nanetta SAILOR, RN as Oncology Nurse Navigator Loretha Ash, MD as Consulting Physician (Hematology and Oncology)  CHIEF COMPLAINTS/PURPOSE OF CONSULTATION:  Newly diagnosed breast cancer  HISTORY OF PRESENTING ILLNESS:  Misty Wilkerson 83 y.o. female is here because of recent diagnosis of left breast cancer  I reviewed her records extensively and collaborated the history with the patient.  SUMMARY OF ONCOLOGIC HISTORY: Oncology History  Breast cancer of upper-inner quadrant of left female breast (HCC)  12/08/1998 Initial Biopsy   Left breast cancer: Lumpectomy followed by radiation T1 BN 0M0 could not tolerate tamoxifen or alternative therapies.   08/12/2013 Surgery   Left breast lumpectomy: Tumor 1  invasive ductal carcinoma 0.4 cm with DCIS, tumor to: IDC 1.2 cm with DCIS positive margin, ER 98%, PR 14%, HER-2 negative, Ki-67 19% margins reexcision negative   09/07/2013 -  Anti-estrogen oral therapy   Letrozole  2.5 mg daily plans for 5 years   Breast cancer of upper-outer quadrant of left female breast (HCC)  09/10/2023 Initial Diagnosis   Breast cancer of upper-outer quadrant of left female breast (HCC)   09/10/2023 Cancer Staging   Staging form: Breast, AJCC 8th Edition - Clinical: Stage IA (cT1, cN0, cM0, G3, ER+, PR+, HER2+) - Signed by Loretha Ash, MD on 09/10/2023 Stage prefix: Initial diagnosis Histologic grading system: 3 grade system   11/25/2023 -  Chemotherapy   Patient is on Treatment Plan : BREAST Trastuzumab  IV (8/6) or SQ (600) D1 q21d     11/26/2023 - 11/26/2023 Chemotherapy   Patient is on Treatment Plan : BREAST Paclitaxel + Trastuzumab  q7d / Trastuzumab  q21d      The patient, an 83 year old woman with a history of breast cancer, presents with a new breast mass. This is  her third occurrence of breast cancer, with previous diagnoses in 2000 and 2014.  Since her last visit here she had left breast lumpectomy, final pathology showed invasive ductal carcinoma measuring 1 x 0.8 x 0.5 cm, grade 2, DCIS focal solid type grade 2, negative margins for both invasive and DCIS.  All margins are negative for malignancy.  No lymph node specimen was submitted.  Prognostics from previous breast biopsy showed ER +100% strong PR +100% strong and HER2 positive by FISH with Ki-67 of 30%.   Discussed the use of AI scribe software for clinical note transcription with the patient, who gave verbal consent to proceed. History of Present Illness  Misty Wilkerson is an 83 year old female with breast cancer on herceptin  and letrozole  who presents with insomnia.  She experiences significant difficulty with sleep, describing her insomnia as distressful. She has tried various over-the-counter medications including Tylenol , melatonin, Unisom, and Tylenol  PMR, but found them ineffective, likening them to 'taking M and M's'. She experienced a single night of eight hours of sleep after taking 5 mg of Ambien , which was given to her by a friend. No daytime drowsiness after taking Ambien .  Her sleep pattern involves initially falling asleep but waking up after an hour and a half, unable to return to sleep. She mentions a habit of getting up and reading in the living room if she cannot sleep, as her husband panics if he wakes up and cannot find her. She emphasizes the need for sleep, stating, 'I've got to have some sleep.'  She is currently undergoing treatment for  breast cancer, receiving Herceptin  and previously letrozole  (Femara ). She experienced severe itching, which led to a temporary discontinuation of letrozole  for three weeks, during which the itching resolved. She is concerned about restarting letrozole  and inquires about the possibility of taking it every other day. She reports having had her  tenth Herceptin  infusion.  She recalls a previous emergency room visit due to a severe reaction with high blood pressure and a fast heart rate, which occurred on July 10th. She is unsure if her insomnia is related to her breast cancer medication, specifically Femara , but notes that her sleep issues began gradually about six weeks ago.  She feels 'antsy' and unable to relax when trying to sleep. She also notes a cultural aspect of co-sleeping with children, which she relates to her current sleep habits.  Rest of the pertinent 10 point ROS reviewed and neg.   MEDICAL HISTORY:  Past Medical History:  Diagnosis Date   Allergy    Arthritis    Cancer (HCC)    left breast   H/O hiatal hernia    eats slowly  now   Hypertension    Personal history of radiation therapy 2002   left breast   Vertigo     SURGICAL HISTORY: Past Surgical History:  Procedure Laterality Date   BREAST BIOPSY Left 08/29/2023   US  LT BREAST BX W LOC DEV 1ST LESION IMG BX SPEC US  GUIDE 08/29/2023 [facility]   BREAST BIOPSY Left 09/27/2023   US  LT RADIOACTIVE SEED LOC 09/27/2023 [facility]   BREAST LUMPECTOMY Left 2002, 2014   BREAST LUMPECTOMY WITH RADIOACTIVE SEED LOCALIZATION Left 09/30/2023   Procedure: LEFT BREAST SEED GUIDED LUMPECTOMY;  Surgeon: Ebbie Cough, MD;  Location: [facility];  Service: General;  Laterality: Left;  LMA   CHOLECYSTECTOMY     COLONOSCOPY N/A 10/26/2015   Procedure: COLONOSCOPY;  Surgeon: Claudis RAYMOND Rivet, MD;  [facility];  Service: Endoscopy;  Laterality: N/A;  1200   DEBRIDEMENT AND CLOSURE WOUND Left 03/25/2018   Procedure: DEBRIDEMENTINTERPHALANGEAL LEFT INDEX FINGER;  Surgeon: Murrell Kuba, MD;  Location: [facility];  Service: Orthopedics;  Laterality: Left;   EXCISION MASS UPPER EXTREMETIES Left 03/25/2018   Procedure: EXCISION MASS LEFT INDEX;  Surgeon: Murrell Kuba, MD;  Location: [facility];  Service:  Orthopedics;  Laterality: Left;   EYE SURGERY     bilateral cataracts   MAXILLARY ANTROSTOMY Left 12/25/2021   Procedure: LEFT MAXILLARY ANTROSTOMY WITH TISSUE REMOVAL;  Surgeon: Karis Clunes, MD;  Location: [facility];  Service: ENT;  Laterality: Left;   PARTIAL MASTECTOMY WITH NEEDLE LOCALIZATION AND AXILLARY SENTINEL LYMPH NODE BX Left 08/12/2013   Procedure: LEFT PARTIAL MASTECTOMY WITH NEEDLE LOCALIZATION ATTEMPTED AXILLARY SENTINEL LYMPH NODE BIOPSY;  Surgeon: Cough Ebbie, MD;  [facility];  Service: General;  Laterality: Left;   RE-EXCISION OF BREAST LUMPECTOMY Left 08/27/2013   Procedure: RE-EXCISION OF BREAST LUMPECTOMY;  Surgeon: Cough Ebbie, MD;  Location: [facility];  Service: General;  Laterality: Left;    SOCIAL HISTORY: Social History   Socioeconomic History   Marital status: Married    Spouse name: Garrel   Number of children: 2   Years of education: Not on file   Highest education level: Bachelor's degree (e.g., BA, AB, BS)  Occupational History   Occupation: retired  Tobacco Use   Smoking status: Former    Current packs/day: 0.00    Average packs/day: 0.5 packs/day for 15.0 years (7.5  ttl pk-yrs)    Types: Cigarettes    Start date: 07/21/1984    Quit date: 07/22/1999    Years since quitting: 24.9   Smokeless tobacco: Never  Vaping Use   Vaping status: Never Used  Substance and Sexual Activity   Alcohol use: Yes    Comment: one alcohol drink daily   Drug use: No   Sexual activity: Yes  Other Topics Concern   Not on file  Social History Narrative   Very independent and pretty active   Social Drivers of Corporate investment banker Strain: Low Risk  (01/21/2024)   Overall Financial Resource Strain (CARDIA)    Difficulty of Paying Living Expenses: Not hard at all  Food Insecurity: No Food Insecurity (01/21/2024)   Hunger Vital Sign    Worried About Running Out of Food in the Last Year: Never true    Ran Out of Food in the Last Year: Never  true  Transportation Needs: No Transportation Needs (01/21/2024)   PRAPARE - Administrator, Civil Service (Medical): No    Lack of Transportation (Non-Medical): No  Physical Activity: Sufficiently Active (01/21/2024)   Exercise Vital Sign    Days of Exercise per Week: 5 days    Minutes of Exercise per Session: 30 min  Stress: No Stress Concern Present (01/21/2024)   [employer] of Occupational Health - Occupational Stress Questionnaire    Feeling of Stress : Not at all  Social Connections: Socially Integrated (01/21/2024)   Social Connection and Isolation Panel    Frequency of Communication with Friends and Family: More than three times a week    Frequency of Social Gatherings with Friends and Family: Twice a week    Attends Religious Services: More than 4 times per year    Active Member of [employer] or Organizations: Yes    Attends Banker Meetings: 1 to 4 times per year    Marital Status: Married  Catering manager Violence: Not At Risk (12/11/2023)   Humiliation, Afraid, Rape, and Kick questionnaire    Fear of Current or Ex-Partner: No    Emotionally Abused: No    Physically Abused: No    Sexually Abused: No    FAMILY HISTORY: Family History  Problem Relation Age of Onset   Hypertension Mother    Diabetes Father    Stroke Father    Breast cancer Sister 85   Breast cancer Sister    Aneurysm Sister    Cancer Brother    Heart disease Brother     ALLERGIES:  is allergic to penicillins and tetracyclines & related.  MEDICATIONS:  Current Outpatient Medications  Medication Sig Dispense Refill   cholecalciferol (VITAMIN D3) 25 MCG (1000 UNIT) tablet Take 2 tablets (2,000 Units total) by mouth daily.     Multiple Vitamin (MULTIVITAMIN) tablet Take 1 tablet by mouth daily.     triamterene -hydrochlorothiazide (MAXZIDE-25) 37.5-25 MG tablet TAKE 1/2 TABLET BY MOUTH DAILY 45 tablet 3   valsartan  (DIOVAN ) 160 MG tablet Take 1 tablet (160 mg total) by mouth  daily. For blood pressure 30 tablet 2   vitamin B-12 (CYANOCOBALAMIN) 1000 MCG tablet Take 1,000 mcg by mouth daily.     letrozole  (FEMARA ) 2.5 MG tablet Take 1 tablet (2.5 mg total) by mouth daily. (Patient not taking: Reported on 06/23/2024) 90 tablet 3   No current facility-administered medications for this visit.     PHYSICAL EXAMINATION: ECOG PERFORMANCE STATUS: 0 - Asymptomatic  Vitals:   06/23/24 1253  06/23/24 1255  BP: (!) 151/55 (!) 145/46  Pulse: 88   Resp: 18   Temp: 98.6 F (37 C)   SpO2: 100%       Filed Weights   06/23/24 1253  Weight: 162 lb 4.8 oz (73.6 kg)    GENERAL:alert, no distress and comfortable Chest: Clear to auscultation bilaterally Heart: Rate and rhythm regular Abdomen: Soft, nontender, nondistended No lower extremity edema  LABORATORY DATA:  I have reviewed the data as listed Lab Results  Component Value Date   WBC 4.9 06/02/2024   HGB 11.9 (L) 06/02/2024   HCT 34.7 (L) 06/02/2024   MCV 89.9 06/02/2024   PLT 230 06/02/2024   Lab Results  Component Value Date   NA 134 (L) 06/02/2024   K 4.7 06/02/2024   CL 105 06/02/2024   CO2 23 06/02/2024    RADIOGRAPHIC STUDIES: I have personally reviewed the radiological reports and agreed with the findings in the report.  ASSESSMENT AND PLAN:   Assessment and Plan Assessment & Plan Breast cancer Undergoing Herceptin  and letrozole  treatment. Herceptin  to complete by Christmas. Discussed potential discontinuation if side effects impact quality of life, referencing a trial supporting six months of treatment. - Restart letrozole  every other day. - Monitor for side effects and effectiveness of letrozole . - Consider discontinuing Herceptin  if quality of life is significantly impacted by side effects.  Adverse effect of letrozole  (pruritus) Pruritus resolved after temporary discontinuation of letrozole . - Restart letrozole  every other day to monitor for recurrence of  pruritus.  Insomnia Chronic insomnia, unresponsive to OTC aids. Ambien  5 mg effective without daytime drowsiness. - Prescribe Ambien  5 mg as needed, not daily. - Send prescription to [facility].  Time spent: 30 min All questions were answered. The patient knows to call the clinic with any problems, questions or concerns.    Amber Stalls, MD 06/23/24

## 2024-06-23 NOTE — Progress Notes (Signed)
 Patient took her tylenol  and benadryl  prior to coming to infusion- has been more than 30 minutes.

## 2024-07-10 ENCOUNTER — Encounter: Payer: Self-pay | Admitting: Hematology and Oncology

## 2024-07-10 ENCOUNTER — Other Ambulatory Visit: Payer: Self-pay | Admitting: *Deleted

## 2024-07-13 ENCOUNTER — Encounter: Payer: Self-pay | Admitting: Hematology and Oncology

## 2024-07-14 ENCOUNTER — Other Ambulatory Visit: Payer: Self-pay

## 2024-07-14 ENCOUNTER — Inpatient Hospital Stay

## 2024-07-14 ENCOUNTER — Encounter: Payer: Self-pay | Admitting: Hematology and Oncology

## 2024-07-14 ENCOUNTER — Inpatient Hospital Stay: Attending: Hematology and Oncology | Admitting: Hematology and Oncology

## 2024-07-14 VITALS — BP 168/48 | HR 97 | Temp 97.1°F | Resp 19 | Ht 61.5 in | Wt 164.4 lb

## 2024-07-14 DIAGNOSIS — Z79811 Long term (current) use of aromatase inhibitors: Secondary | ICD-10-CM | POA: Insufficient documentation

## 2024-07-14 DIAGNOSIS — Z5112 Encounter for antineoplastic immunotherapy: Secondary | ICD-10-CM | POA: Insufficient documentation

## 2024-07-14 DIAGNOSIS — C50412 Malignant neoplasm of upper-outer quadrant of left female breast: Secondary | ICD-10-CM

## 2024-07-14 DIAGNOSIS — G4700 Insomnia, unspecified: Secondary | ICD-10-CM | POA: Diagnosis not present

## 2024-07-14 DIAGNOSIS — Z170 Estrogen receptor positive status [ER+]: Secondary | ICD-10-CM | POA: Diagnosis not present

## 2024-07-14 MED ORDER — SODIUM CHLORIDE 0.9 % IV SOLN: INTRAVENOUS | Status: DC

## 2024-07-14 MED ORDER — TRASTUZUMAB-ANNS CHEMO 150 MG IV SOLR
6.0000 mg/kg | Freq: Once | INTRAVENOUS | Status: AC
  Administered 2024-07-14: 420 mg via INTRAVENOUS
  Filled 2024-07-14: qty 20

## 2024-07-14 MED ORDER — HEPARIN SOD (PORK) LOCK FLUSH 100 UNIT/ML IV SOLN: 500.0000 [IU] | Freq: Once | INTRAVENOUS | Status: DC | PRN

## 2024-07-14 MED ORDER — SODIUM CHLORIDE 0.9% FLUSH: 10.0000 mL | INTRAVENOUS | Status: DC | PRN

## 2024-07-14 NOTE — Progress Notes (Signed)
 [facility] CONSULT NOTE  Patient Care Team: Zollie Lowers, MD as PCP - General (Family Medicine) Glean Stephane BROCKS, RN (Inactive) as Oncology Nurse Navigator Tyree Nanetta SAILOR, RN as Oncology Nurse Navigator Loretha Ash, MD as Consulting Physician (Hematology and Oncology)  CHIEF COMPLAINTS/PURPOSE OF CONSULTATION:  Newly diagnosed breast cancer  HISTORY OF PRESENTING ILLNESS:  Misty Wilkerson 83 y.o. female is here because of recent diagnosis of left breast cancer  I reviewed her records extensively and collaborated the history with the patient.  SUMMARY OF ONCOLOGIC HISTORY: Oncology History  Breast cancer of upper-inner quadrant of left female breast (HCC)  12/08/1998 Initial Biopsy   Left breast cancer: Lumpectomy followed by radiation T1 BN 0M0 could not tolerate tamoxifen or alternative therapies.   08/12/2013 Surgery   Left breast lumpectomy: Tumor 1  invasive ductal carcinoma 0.4 cm with DCIS, tumor to: IDC 1.2 cm with DCIS positive margin, ER 98%, PR 14%, HER-2 negative, Ki-67 19% margins reexcision negative   09/07/2013 -  Anti-estrogen oral therapy   Letrozole  2.5 mg daily plans for 5 years   Breast cancer of upper-outer quadrant of left female breast (HCC)  09/10/2023 Initial Diagnosis   Breast cancer of upper-outer quadrant of left female breast (HCC)   09/10/2023 Cancer Staging   Staging form: Breast, AJCC 8th Edition - Clinical: Stage IA (cT1, cN0, cM0, G3, ER+, PR+, HER2+) - Signed by Loretha Ash, MD on 09/10/2023 Stage prefix: Initial diagnosis Histologic grading system: 3 grade system   11/25/2023 -  Chemotherapy   Patient is on Treatment Plan : BREAST Trastuzumab  IV (8/6) or SQ (600) D1 q21d     11/26/2023 - 11/26/2023 Chemotherapy   Patient is on Treatment Plan : BREAST Paclitaxel + Trastuzumab  q7d / Trastuzumab  q21d      The patient, an 83 year old woman with a history of breast cancer, presents with a new breast mass. This is  her third occurrence of breast cancer, with previous diagnoses in 2000 and 2014.  Since her last visit here she had left breast lumpectomy, final pathology showed invasive ductal carcinoma measuring 1 x 0.8 x 0.5 cm, grade 2, DCIS focal solid type grade 2, negative margins for both invasive and DCIS.  All margins are negative for malignancy.  No lymph node specimen was submitted.  Prognostics from previous breast biopsy showed ER +100% strong PR +100% strong and HER2 positive by FISH with Ki-67 of 30%.   Discussed the use of AI scribe software for clinical note transcription with the patient, who gave verbal consent to proceed. History of Present Illness  Misty Wilkerson is an 83 year old female with breast cancer who presents for follow-up on her treatment regimen. She is accompanied by her husband, Mr. Netta.  She is currently undergoing treatment for breast cancer, taking letrozole  every other day, which she finds effective. She is also receiving Herceptin , with today marking her twelfth cycle. Completion of Herceptin  treatment is anticipated by December 2nd, 2025.  She has experienced improved sleep since being prescribed Ambien  to be taken as needed. She uses it sparingly due to concerns about dependency but finds reassurance in having it available, which has positively impacted her sleep quality.  She had an echocardiogram in July.  In terms of general health, she reports no issues with bowel movements or urination.  Rest of the pertinent 10 point ROS reviewed and neg.   MEDICAL HISTORY:  Past Medical History:  Diagnosis Date   Allergy    Arthritis  Cancer [facility])    left breast   H/O hiatal hernia    eats slowly  now   Hypertension    Personal history of radiation therapy 2002   left breast   Vertigo     SURGICAL HISTORY: Past Surgical History:  Procedure Laterality Date   BREAST BIOPSY Left 08/29/2023   US  LT BREAST BX W LOC DEV 1ST LESION IMG BX SPEC US  GUIDE 08/29/2023  [facility] MAMMOGRAPHY   BREAST BIOPSY Left 09/27/2023   US  LT RADIOACTIVE SEED LOC 09/27/2023 [facility]AMMOGRAP[facility]   BREAST LUMPECTOMY Left 2002, 2014   BREAST LUMPECTOMY WITH RADIOACTIVE SEED LOCALIZATION Left 09/30/2023   Procedure: LEFT BREAST SEED GUIDED LUMPECTOMY;  Surgeon: Ebbie Cough, MD;  Location: [facility];  Service: General;  Laterality: Left;  LMA   CHOLECYSTECTOMY     COLONOSCOPY N/A 10/26/2015   Procedure: COLONOSCOPY;  Surgeon: Claudis RAYMOND Rivet, MD;  Location: [facility];  Service: Endoscopy;  Laterality: N/A;  1200   DEBRIDEMENT AND CLOSURE WOUND Left 03/25/2018   Procedure: DEBRIDEMENTINTERPHALANGEAL LEFT INDEX FINGER;  Surgeon: Murrell Kuba, MD;  Location: [facility];  Service: Orthopedics;  Laterality: Left;   EXCISION MASS UPPER EXTREMETIES Left 03/25/2018   Procedure: EXCISION MASS LEFT INDEX;  Surgeon: Murrell Kuba, MD;  Location: [facility];  Service: Orthopedics;  Laterality: Left;   EYE SURGERY     bilateral cataracts   MAXILLARY ANTROSTOMY Left 12/25/2021   Procedure: LEFT MAXILLARY ANTROSTOMY WITH TISSUE REMOVAL;  Surgeon: Karis Clunes, MD;  Location: [facility];  Service: ENT;  Laterality: Left;   PARTIAL MASTECTOMY WITH NEEDLE LOCALIZATION AND AXILLARY SENTINEL LYMPH NODE BX Left 08/12/2013   Procedure: LEFT PARTIAL MASTECTOMY WITH NEEDLE LOCALIZATION ATTEMPTED AXILLARY SENTINEL LYMPH NODE BIOPSY;  Surgeon: Cough Ebbie, MD;  Location: [facility];  Service: General;  Laterality: Left;   RE-EXCISION OF BREAST LUMPECTOMY Left 08/27/2013   Procedure: RE-EXCISION OF BREAST LUMPECTOMY;  Surgeon: Cough Ebbie, MD;  Location: [facility];  Service: General;  Laterality: Left;    SOCIAL HISTORY: Social History   Socioeconomic History   Marital status: Married    Spouse name: Garrel   Number of children: 2   Years of education: Not on file   Highest education level: Bachelor's degree (e.g., BA, AB, BS)  Occupational  History   Occupation: retired  Tobacco Use   Smoking status: Former    Current packs/day: 0.00    Average packs/day: 0.5 packs/day for 15.0 years (7.5 ttl pk-yrs)    Types: Cigarettes    Start date: 07/21/1984    Quit date: 07/22/1999    Years since quitting: 24.9   Smokeless tobacco: Never  Vaping Use   Vaping status: Never Used  Substance and Sexual Activity   Alcohol use: Yes    Comment: one alcohol drink daily   Drug use: No   Sexual activity: Yes  Other Topics Concern   Not on file  Social History Narrative   Very independent and pretty active   Social Drivers of Corporate investment banker Strain: Low Risk  (01/21/2024)   Overall Financial Resource Strain (CARDIA)    Difficulty of Paying Living Expenses: Not hard at all  Food Insecurity: No Food Insecurity (01/21/2024)   Hunger Vital Sign    Worried About Running Out of Food in the Last Year: Never true    Ran Out of Food in the Last Year: Never true  Transportation Needs: No Transportation Needs (01/21/2024)  PRAPARE - Administrator, Civil Service (Medical): No    Lack of Transportation (Non-Medical): No  Physical Activity: Sufficiently Active (01/21/2024)   Exercise Vital Sign    Days of Exercise per Week: 5 days    Minutes of Exercise per Session: 30 min  Stress: No Stress Concern Present (01/21/2024)   [employer] of Occupational Health - Occupational Stress Questionnaire    Feeling of Stress : Not at all  Social Connections: Socially Integrated (01/21/2024)   Social Connection and Isolation Panel    Frequency of Communication with Friends and Family: More than three times a week    Frequency of Social Gatherings with Friends and Family: Twice a week    Attends Religious Services: More than 4 times per year    Active Member of [employer] or Organizations: Yes    Attends Banker Meetings: 1 to 4 times per year    Marital Status: Married  Catering manager Violence: Not At Risk (12/11/2023)    Humiliation, Afraid, Rape, and Kick questionnaire    Fear of Current or Ex-Partner: No    Emotionally Abused: No    Physically Abused: No    Sexually Abused: No    FAMILY HISTORY: Family History  Problem Relation Age of Onset   Hypertension Mother    Diabetes Father    Stroke Father    Breast cancer Sister 33   Breast cancer Sister    Aneurysm Sister    Cancer Brother    Heart disease Brother     ALLERGIES:  is allergic to penicillins and tetracyclines & related.  MEDICATIONS:  Current Outpatient Medications  Medication Sig Dispense Refill   cholecalciferol (VITAMIN D3) 25 MCG (1000 UNIT) tablet Take 2 tablets (2,000 Units total) by mouth daily.     letrozole  (FEMARA ) 2.5 MG tablet Take 1 tablet (2.5 mg total) by mouth daily. 90 tablet 3   Multiple Vitamin (MULTIVITAMIN) tablet Take 1 tablet by mouth daily.     triamterene -hydrochlorothiazide (MAXZIDE-25) 37.5-25 MG tablet TAKE 1/2 TABLET BY MOUTH DAILY 45 tablet 3   valsartan  (DIOVAN ) 160 MG tablet Take 1 tablet (160 mg total) by mouth daily. For blood pressure 30 tablet 2   vitamin B-12 (CYANOCOBALAMIN) 1000 MCG tablet Take 1,000 mcg by mouth daily.     zolpidem  (AMBIEN ) 5 MG tablet Take 1 tablet (5 mg total) by mouth at bedtime as needed for sleep. 30 tablet 1   No current facility-administered medications for this visit.     PHYSICAL EXAMINATION: ECOG PERFORMANCE STATUS: 0 - Asymptomatic  Vitals:   07/14/24 1435 07/14/24 1438  BP: (!) 173/61 (!) 168/48  Pulse: 97   Resp: 19   Temp: (!) 97.1 F (36.2 C)   SpO2: 100%       Filed Weights   07/14/24 1435  Weight: 164 lb 6.4 oz (74.6 kg)    GENERAL:alert, no distress and comfortable Chest: Clear to auscultation bilaterally Heart: Rate and rhythm regular Abdomen: Soft, nontender, nondistended No lower extremity edema  LABORATORY DATA:  I have reviewed the data as listed Lab Results  Component Value Date   WBC 4.9 06/02/2024   HGB 11.9 (L)  06/02/2024   HCT 34.7 (L) 06/02/2024   MCV 89.9 06/02/2024   PLT 230 06/02/2024   Lab Results  Component Value Date   NA 134 (L) 06/02/2024   K 4.7 06/02/2024   CL 105 06/02/2024   CO2 23 06/02/2024    RADIOGRAPHIC STUDIES: I have  personally reviewed the radiological reports and agreed with the findings in the report.  ASSESSMENT AND PLAN:   Assessment and Plan Assessment & Plan   Breast cancer on active treatment Breast cancer treated with letrozole  and Herceptin . Letrozole  effective and well-tolerated. She can only tolerate this every other day. Herceptin  ongoing with six cycles remaining, to complete by December 2nd. Normal echocardiogram in July. - Continue letrozole  every other day. - Administer Herceptin  today and complete remaining cycles by December 2nd.  Insomnia Insomnia managed with Ambien  as needed. She expressed fear of dependency but finds comfort in availability, improving sleep quality. - Continue Ambien  as needed for insomnia, with caution regarding dependency.  Time spent: 20 min All questions were answered. The patient knows to call the clinic with any problems, questions or concerns.    Amber Stalls, MD 07/14/24

## 2024-07-14 NOTE — Patient Instructions (Signed)

## 2024-07-14 NOTE — Progress Notes (Signed)
 PGY2 Oncology Resident Intervention  Intervention Adjusted treatment plan to include Pharmacy Communication to confirm before each treatment that patient took their premedications, Tylenol  and Benadryl , at home before coming to their infusion appointment. Patient expressed they would rather take their premedications at home rather than be charged $51 dollars per infusion.   Plan reviewed by Wilma Dollar, PharmD Informed Dr. Loretha of these changes.   Alfonso MARLA Buys, PharmD Pharmacy Resident  07/14/2024 12:56 PM

## 2024-07-14 NOTE — Progress Notes (Signed)
 Pt states she took medication at home prior to arriving

## 2024-07-15 ENCOUNTER — Encounter: Payer: Self-pay | Admitting: Hematology and Oncology

## 2024-08-04 ENCOUNTER — Inpatient Hospital Stay: Attending: Hematology and Oncology

## 2024-08-04 ENCOUNTER — Ambulatory Visit: Admitting: Hematology and Oncology

## 2024-08-04 VITALS — BP 154/58 | HR 98 | Temp 98.2°F | Resp 18 | Wt 164.0 lb

## 2024-08-04 DIAGNOSIS — Z5112 Encounter for antineoplastic immunotherapy: Secondary | ICD-10-CM | POA: Diagnosis not present

## 2024-08-04 DIAGNOSIS — C50412 Malignant neoplasm of upper-outer quadrant of left female breast: Secondary | ICD-10-CM | POA: Diagnosis not present

## 2024-08-04 MED ORDER — SODIUM CHLORIDE 0.9 % IV SOLN: INTRAVENOUS | Status: DC

## 2024-08-04 MED ORDER — TRASTUZUMAB-ANNS CHEMO 150 MG IV SOLR
6.0000 mg/kg | Freq: Once | INTRAVENOUS | Status: AC
  Administered 2024-08-04: 420 mg via INTRAVENOUS
  Filled 2024-08-04: qty 20

## 2024-08-06 ENCOUNTER — Ambulatory Visit: Admitting: Family Medicine

## 2024-08-06 ENCOUNTER — Encounter: Payer: Self-pay | Admitting: Family Medicine

## 2024-08-06 VITALS — BP 137/70 | HR 79 | Temp 98.2°F | Ht 61.5 in | Wt 161.0 lb

## 2024-08-06 DIAGNOSIS — N1832 Chronic kidney disease, stage 3b: Secondary | ICD-10-CM | POA: Diagnosis not present

## 2024-08-06 DIAGNOSIS — Z170 Estrogen receptor positive status [ER+]: Secondary | ICD-10-CM | POA: Diagnosis not present

## 2024-08-06 DIAGNOSIS — I10 Essential (primary) hypertension: Secondary | ICD-10-CM | POA: Diagnosis not present

## 2024-08-06 DIAGNOSIS — M810 Age-related osteoporosis without current pathological fracture: Secondary | ICD-10-CM | POA: Diagnosis not present

## 2024-08-06 DIAGNOSIS — C50412 Malignant neoplasm of upper-outer quadrant of left female breast: Secondary | ICD-10-CM | POA: Diagnosis not present

## 2024-08-06 NOTE — Progress Notes (Signed)
 Subjective:  Patient ID: Misty Wilkerson, female    DOB: [birth year]  Age: 83 y.o. MRN: [id number]  CC: Medical Management of Chronic Issues   HPI  Discussed the use of AI scribe software for clinical note transcription with the patient, who gave verbal consent to proceed.  History of Present Illness Misty Wilkerson is an 83 year old female with breast cancer who presents for follow-up of her treatment and management of other chronic conditions.  She is currently undergoing treatment for estrogen receptor-positive breast cancer, with four more infusions of monoclonal antibody therapy remaining. She completed 20 sessions of radiation therapy in January 2025. An echocardiogram is performed every three months to monitor potential cardiac effects on the left ventricle due to the treatment.  She has a history of osteoporosis, which she manages through walking and taking calcium and vitamin D  supplements. She is not on any specific osteoporosis medication.  She takes triamterene -hydrochlorothiazide and valsartan  for hypertension.  She experiences occasional sleep disturbances and takes Ambien  (zolpidem ) approximately once a week when she has difficulty sleeping, particularly when she wakes up at 3 AM.  She has a history of itching and had blood tests done in July 2025, which indicated concerns with kidney function and low sodium levels. She has not had a CBC since spring 2025.  Due to her treatment schedule, which requires visits to [facility] every three weeks, she has not been able to travel this year.          08/06/2024    1:30 PM 07/14/2024    3:04 PM 06/23/2024    1:40 PM  Depression screen PHQ 2/9  Decreased Interest 0 0 0  Down, Depressed, Hopeless 0 0 0  PHQ - 2 Score 0 0 0  Altered sleeping 1    Tired, decreased energy 1    Change in appetite 0    Feeling bad or failure about yourself  0    Trouble concentrating 0    Moving slowly or fidgety/restless 0    Suicidal  thoughts 0    PHQ-9 Score 2    Difficult doing work/chores Not difficult at all      History Lemmie has a past medical history of Allergy, Arthritis, Cancer (HCC), H/O hiatal hernia, Hypertension, Personal history of radiation therapy (2002), and Vertigo.   She has a past surgical history that includes Cholecystectomy; Eye surgery; Partial mastectomy with needle localization and axillary sentinel lymph node bx (Left, 08/12/2013); Re-excision of breast lumpectomy (Left, 08/27/2013); Colonoscopy (N/A, 10/26/2015); Excision mass upper extremeties (Left, 03/25/2018); Debridement and closure wound (Left, 03/25/2018); Breast lumpectomy (Left, 2002, 2014); Maxillary antrostomy (Left, 12/25/2021); Breast biopsy (Left, 08/29/2023); Breast biopsy (Left, 09/27/2023); and Breast lumpectomy with radioactive seed localization (Left, 09/30/2023).   Her family history includes Aneurysm in her sister; Breast cancer in her sister; Breast cancer (age of onset: 29) in her sister; Cancer in her brother; Diabetes in her father; Heart disease in her brother; Hypertension in her mother; Stroke in her father.She reports that she quit smoking about 25 years ago. Her smoking use included cigarettes. She started smoking about 40 years ago. She has a 7.5 pack-year smoking history. She has never used smokeless tobacco. She reports current alcohol use. She reports that she does not use drugs.    ROS Review of Systems  Constitutional: Negative.   HENT: Negative.    Eyes:  Negative for visual disturbance.  Respiratory:  Negative for shortness of breath.   Cardiovascular:  Negative for chest pain.  Gastrointestinal:  Negative for abdominal pain.  Musculoskeletal:  Negative for arthralgias.  Psychiatric/Behavioral:  Positive for sleep disturbance.     Objective:  BP 137/70   Pulse 79   Temp 98.2 F (36.8 C)   Ht 5' 1.5 (1.562 m)   Wt 161 lb (73 kg)   SpO2 98%   BMI 29.93 kg/m   BP Readings from Last 3 Encounters:   08/06/24 137/70  08/04/24 (!) (P) 147/63  07/14/24 (!) 168/48    Wt Readings from Last 3 Encounters:  08/06/24 161 lb (73 kg)  08/04/24 (P) 160 lb (72.6 kg)  07/14/24 164 lb 6.4 oz (74.6 kg)     Physical Exam Constitutional:      General: She is not in acute distress.    Appearance: She is well-developed.  Cardiovascular:     Rate and Rhythm: Normal rate and regular rhythm.  Pulmonary:     Breath sounds: Normal breath sounds.  Musculoskeletal:        General: Normal range of motion.  Skin:    General: Skin is warm and dry.  Neurological:     Mental Status: She is alert and oriented to person, place, and time.      Assessment & Plan:  Malignant neoplasm of upper-outer quadrant of left breast in female, estrogen receptor positive (HCC)  Benign essential hypertension -     CMP14+EGFR  Age-related osteoporosis without current pathological fracture  Chronic renal failure (CRF), stage 3b (HCC)    Assessment and Plan Assessment & Plan Malignant neoplasm of upper-outer quadrant of left breast, estrogen receptor positive, on monoclonal antibody therapy   Estrogen receptor positive breast cancer in the upper-outer quadrant of the left breast is being treated with Herceptin . She completed 20 radiation sessions in January. Echocardiograms are performed every three months to monitor potential cardiac effects on the left ventricle. She remains optimistic and adheres to the oncologist's recommendations. The treatment targets cancer cells, minimizing damage to surrounding tissues. Four more infusions are planned, with reassessment for further treatment as needed. Continue Herceptin  therapy and perform echocardiograms every three months.  Chronic kidney disease, unspecified stage   Chronic kidney disease is indicated by previous lab results showing suboptimal kidney function and low sodium levels. Follow-up blood tests are due to reassess kidney function and electrolyte levels. Order  blood tests to evaluate kidney function and sodium levels.  Hypertension   Hypertension is managed with triamterene -hydrochlorothiazide and valsartan . She is compliant with her medication regimen. Continue current antihypertensive medications: triamterene -hydrochlorothiazide and valsartan .  Osteoporosis   Osteoporosis is managed through lifestyle modifications, including regular walking. She is not on specific osteoporosis medication but takes calcium and vitamin D  supplements. She is aware of potential bone deterioration but prefers to avoid additional medications. Continue calcium and vitamin D  supplementation and encourage regular weight-bearing exercise such as walking.  Insomnia   Intermittent insomnia is managed with occasional use of zolpidem  (Ambien ), approximately once a week. She is cautious about frequent use to avoid dependency. Use zolpidem  (Ambien ) as needed, approximately once a week, for insomnia.       Follow-up: Return in about 6 months (around 02/03/2025) for [employer] physical.  Butler Der, M.D.

## 2024-08-07 ENCOUNTER — Other Ambulatory Visit: Payer: Self-pay

## 2024-08-07 ENCOUNTER — Encounter: Payer: Self-pay | Admitting: Hematology and Oncology

## 2024-08-07 LAB — CMP14+EGFR
ALT: 23 IU/L (ref 0–32)
AST: 27 IU/L (ref 0–40)
Albumin: 4.3 g/dL (ref 3.7–4.7)
Alkaline Phosphatase: 69 IU/L (ref 44–121)
BUN/Creatinine Ratio: 18 (ref 12–28)
BUN: 28 mg/dL — ABNORMAL HIGH (ref 8–27)
Bilirubin Total: 0.4 mg/dL (ref 0.0–1.2)
CO2: 20 mmol/L (ref 20–29)
Calcium: 9.7 mg/dL (ref 8.7–10.3)
Chloride: 101 mmol/L (ref 96–106)
Creatinine, Ser: 1.54 mg/dL — ABNORMAL HIGH (ref 0.57–1.00)
Globulin, Total: 2.7 g/dL (ref 1.5–4.5)
Glucose: 92 mg/dL (ref 70–99)
Potassium: 5.8 mmol/L — ABNORMAL HIGH (ref 3.5–5.2)
Sodium: 135 mmol/L (ref 134–144)
Total Protein: 7 g/dL (ref 6.0–8.5)
eGFR: 34 mL/min/1.73 — ABNORMAL LOW (ref >59)

## 2024-08-10 ENCOUNTER — Encounter: Payer: Self-pay | Admitting: Family Medicine

## 2024-08-10 ENCOUNTER — Ambulatory Visit: Payer: Self-pay | Admitting: Family Medicine

## 2024-08-17 ENCOUNTER — Other Ambulatory Visit: Payer: Self-pay | Admitting: Family Medicine

## 2024-08-17 ENCOUNTER — Other Ambulatory Visit: Payer: Self-pay | Admitting: Hematology and Oncology

## 2024-08-26 ENCOUNTER — Other Ambulatory Visit: Payer: Self-pay

## 2024-08-26 ENCOUNTER — Inpatient Hospital Stay: Admitting: Hematology and Oncology

## 2024-08-26 ENCOUNTER — Inpatient Hospital Stay: Attending: Hematology and Oncology

## 2024-08-26 VITALS — BP 155/50 | HR 88 | Temp 97.9°F | Resp 17 | Wt 163.4 lb

## 2024-08-26 DIAGNOSIS — C50412 Malignant neoplasm of upper-outer quadrant of left female breast: Secondary | ICD-10-CM | POA: Diagnosis present

## 2024-08-26 DIAGNOSIS — Z79811 Long term (current) use of aromatase inhibitors: Secondary | ICD-10-CM | POA: Insufficient documentation

## 2024-08-26 DIAGNOSIS — Z5112 Encounter for antineoplastic immunotherapy: Secondary | ICD-10-CM | POA: Diagnosis not present

## 2024-08-26 DIAGNOSIS — G4700 Insomnia, unspecified: Secondary | ICD-10-CM | POA: Diagnosis not present

## 2024-08-26 DIAGNOSIS — Z170 Estrogen receptor positive status [ER+]: Secondary | ICD-10-CM | POA: Diagnosis not present

## 2024-08-26 DIAGNOSIS — Z5181 Encounter for therapeutic drug level monitoring: Secondary | ICD-10-CM

## 2024-08-26 DIAGNOSIS — I890 Lymphedema, not elsewhere classified: Secondary | ICD-10-CM | POA: Diagnosis not present

## 2024-08-26 DIAGNOSIS — L905 Scar conditions and fibrosis of skin: Secondary | ICD-10-CM | POA: Insufficient documentation

## 2024-08-26 MED ORDER — SODIUM CHLORIDE 0.9 % IV SOLN: INTRAVENOUS | Status: DC

## 2024-08-26 MED ORDER — TRASTUZUMAB-ANNS CHEMO 150 MG IV SOLR
6.0000 mg/kg | Freq: Once | INTRAVENOUS | Status: AC
  Administered 2024-08-26: 420 mg via INTRAVENOUS
  Filled 2024-08-26: qty 20

## 2024-08-26 NOTE — Patient Instructions (Signed)

## 2024-08-26 NOTE — Progress Notes (Signed)
Pt took premeds at home

## 2024-08-26 NOTE — Progress Notes (Signed)
 [facility] CONSULT NOTE  Patient Care Team: Zollie Lowers, MD as PCP - General (Family Medicine) Tyree Nanetta SAILOR, RN as Oncology Nurse Navigator Loretha Ash, MD as Consulting Physician (Hematology and Oncology)  CHIEF COMPLAINTS/PURPOSE OF CONSULTATION:  Newly diagnosed breast cancer  HISTORY OF PRESENTING ILLNESS:  Misty Wilkerson 83 y.o. female is here because of recent diagnosis of left breast cancer  I reviewed her records extensively and collaborated the history with the patient.  SUMMARY OF ONCOLOGIC HISTORY: Oncology History  Breast cancer of upper-inner quadrant of left female breast (HCC)  12/08/1998 Initial Biopsy   Left breast cancer: Lumpectomy followed by radiation T1 BN 0M0 could not tolerate tamoxifen or alternative therapies.   08/12/2013 Surgery   Left breast lumpectomy: Tumor 1  invasive ductal carcinoma 0.4 cm with DCIS, tumor to: IDC 1.2 cm with DCIS positive margin, ER 98%, PR 14%, HER-2 negative, Ki-67 19% margins reexcision negative   09/07/2013 -  Anti-estrogen oral therapy   Letrozole  2.5 mg daily plans for 5 years   Breast cancer of upper-outer quadrant of left female breast (HCC)  09/10/2023 Initial Diagnosis   Breast cancer of upper-outer quadrant of left female breast (HCC)   09/10/2023 Cancer Staging   Staging form: Breast, AJCC 8th Edition - Clinical: Stage IA (cT1, cN0, cM0, G3, ER+, PR+, HER2+) - Signed by Loretha Ash, MD on 09/10/2023 Stage prefix: Initial diagnosis Histologic grading system: 3 grade system   11/25/2023 -  Chemotherapy   Patient is on Treatment Plan : BREAST Trastuzumab  IV (8/6) or SQ (600) D1 q21d     11/26/2023 - 11/26/2023 Chemotherapy   Patient is on Treatment Plan : BREAST Paclitaxel + Trastuzumab  q7d / Trastuzumab  q21d      The patient, an 83 year old woman with a history of breast cancer, presents with a new breast mass. This is her third occurrence of breast cancer, with previous  diagnoses in 2000 and 2014.  Since her last visit here she had left breast lumpectomy, final pathology showed invasive ductal carcinoma measuring 1 x 0.8 x 0.5 cm, grade 2, DCIS focal solid type grade 2, negative margins for both invasive and DCIS.  All margins are negative for malignancy.  No lymph node specimen was submitted.  Prognostics from previous breast biopsy showed ER +100% strong PR +100% strong and HER2 positive by FISH with Ki-67 of 30%.   Discussed the use of AI scribe software for clinical note transcription with the patient, who gave verbal consent to proceed. History of Present Illness  Misty Wilkerson is an 83 year old female with breast cancer who presents for follow-up on her treatment regimen. She is accompanied by her husband, Mr. Graul.  She experiences significant sleep disturbances following her infusion treatments, describing her sleep as 'nonexistent' for about a week and a half after the infusion, then gradually improving by the third week. She anticipates another period of sleeplessness soon. She is currently using Ambien  as a sleep aid, which was last refilled on August 17, 2024.  She is undergoing treatment with Herceptin . An echocardiogram is scheduled for mid-October, following a previous one in July, as part of her routine monitoring every three months. No chest pain, chest pressure, or shortness of breath.  She takes letrozole  every other day, which has improved her joint pain. She has been able to manage her medication supply and has enough refills available.  Rest of the pertinent 10 point ROS reviewed and neg.   MEDICAL HISTORY:  Past Medical  History:  Diagnosis Date   Allergy    Arthritis    Cancer (HCC)    left breast   H/O hiatal hernia    eats slowly  now   Hypertension    Personal history of radiation therapy 2002   left breast   Vertigo     SURGICAL HISTORY: Past Surgical History:  Procedure Laterality Date   BREAST BIOPSY Left  08/29/2023   US  LT BREAST BX W LOC DEV 1ST LESION IMG BX SPEC US  GUIDE 08/29/2023 [facility]   BREAST BIOPSY Left 09/27/2023   US  LT RADIOACTIVE SEED LOC 09/27/2023 [facility]   BREAST LUMPECTOMY Left 2002, 2014   BREAST LUMPECTOMY WITH RADIOACTIVE SEED LOCALIZATION Left 09/30/2023   Procedure: LEFT BREAST SEED GUIDED LUMPECTOMY;  Surgeon: Ebbie Cough, MD;  Location: [facility];  Service: General;  Laterality: Left;  LMA   CHOLECYSTECTOMY     COLONOSCOPY N/A 10/26/2015   Procedure: COLONOSCOPY;  Surgeon: Claudis RAYMOND Rivet, MD;  Location: [facility];  Service: Endoscopy;  Laterality: N/A;  1200   DEBRIDEMENT AND CLOSURE WOUND Left 03/25/2018   Procedure: DEBRIDEMENTINTERPHALANGEAL LEFT INDEX FINGER;  Surgeon: Murrell Kuba, MD;  Location: [facility];  Service: Orthopedics;  Laterality: Left;   EXCISION MASS UPPER EXTREMETIES Left 03/25/2018   Procedure: EXCISION MASS LEFT INDEX;  Surgeon: Murrell Kuba, MD;  Location: [facility];  Service: Orthopedics;  Laterality: Left;   EYE SURGERY     bilateral cataracts   MAXILLARY ANTROSTOMY Left 12/25/2021   Procedure: LEFT MAXILLARY ANTROSTOMY WITH TISSUE REMOVAL;  Surgeon: Karis Clunes, MD;  Location: [facility];  Service: ENT;  Laterality: Left;   PARTIAL MASTECTOMY WITH NEEDLE LOCALIZATION AND AXILLARY SENTINEL LYMPH NODE BX Left 08/12/2013   Procedure: LEFT PARTIAL MASTECTOMY WITH NEEDLE LOCALIZATION ATTEMPTED AXILLARY SENTINEL LYMPH NODE BIOPSY;  Surgeon: Cough Ebbie, MD;  Location: [facility];  Service: General;  Laterality: Left;   RE-EXCISION OF BREAST LUMPECTOMY Left 08/27/2013   Procedure: RE-EXCISION OF BREAST LUMPECTOMY;  Surgeon: Cough Ebbie, MD;  Location: [facility];  Service: General;  Laterality: Left;    SOCIAL HISTORY: Social History   Socioeconomic History   Marital status: Married    Spouse name: Garrel   Number of children: 2   Years of education: Not on  file   Highest education level: Bachelor's degree (e.g., BA, AB, BS)  Occupational History   Occupation: retired  Tobacco Use   Smoking status: Former    Current packs/day: 0.00    Average packs/day: 0.5 packs/day for 15.0 years (7.5 ttl pk-yrs)    Types: Cigarettes    Start date: 07/21/1984    Quit date: 07/22/1999    Years since quitting: 25.1   Smokeless tobacco: Never  Vaping Use   Vaping status: Never Used  Substance and Sexual Activity   Alcohol use: Yes    Comment: one alcohol drink daily   Drug use: No   Sexual activity: Yes  Other Topics Concern   Not on file  Social History Narrative   Very independent and pretty active   Social Drivers of Corporate investment banker Strain: Low Risk  (07/30/2024)   Overall Financial Resource Strain (CARDIA)    Difficulty of Paying Living Expenses: Not hard at all  Food Insecurity: No Food Insecurity (07/30/2024)   Hunger Vital Sign    Worried About Running Out of Food in the Last Year: Never true    Ran Out of  Food in the Last Year: Never true  Transportation Needs: No Transportation Needs (07/30/2024)   PRAPARE - Administrator, Civil Service (Medical): No    Lack of Transportation (Non-Medical): No  Physical Activity: Insufficiently Active (07/30/2024)   Exercise Vital Sign    Days of Exercise per Week: 3 days    Minutes of Exercise per Session: 40 min  Stress: No Stress Concern Present (07/30/2024)   [employer] of Occupational Health - Occupational Stress Questionnaire    Feeling of Stress: Only a little  Social Connections: Socially Integrated (07/30/2024)   Social Connection and Isolation Panel    Frequency of Communication with Friends and Family: More than three times a week    Frequency of Social Gatherings with Friends and Family: Twice a week    Attends Religious Services: More than 4 times per year    Active Member of [employer] or Organizations: Yes    Attends Banker Meetings: 1 to 4 times per  year    Marital Status: Married  Catering manager Violence: Not At Risk (12/11/2023)   Humiliation, Afraid, Rape, and Kick questionnaire    Fear of Current or Ex-Partner: No    Emotionally Abused: No    Physically Abused: No    Sexually Abused: No    FAMILY HISTORY: Family History  Problem Relation Age of Onset   Hypertension Mother    Diabetes Father    Stroke Father    Breast cancer Sister 59   Breast cancer Sister    Aneurysm Sister    Cancer Brother    Heart disease Brother     ALLERGIES:  is allergic to penicillins and tetracyclines & related.  MEDICATIONS:  Current Outpatient Medications  Medication Sig Dispense Refill   cholecalciferol (VITAMIN D3) 25 MCG (1000 UNIT) tablet Take 2 tablets (2,000 Units total) by mouth daily.     letrozole  (FEMARA ) 2.5 MG tablet Take 1 tablet (2.5 mg total) by mouth daily. 90 tablet 3   Multiple Vitamin (MULTIVITAMIN) tablet Take 1 tablet by mouth daily.     triamterene -hydrochlorothiazide (MAXZIDE-25) 37.5-25 MG tablet TAKE 1/2 TABLET BY MOUTH DAILY 45 tablet 3   valsartan  (DIOVAN ) 160 MG tablet TAKE 1 TABLET BY MOUTH ONCE DAILY FOR blood pressure. 30 tablet 2   vitamin B-12 (CYANOCOBALAMIN) 1000 MCG tablet Take 1,000 mcg by mouth daily.     zolpidem  (AMBIEN ) 5 MG tablet TAKE 1 TABLET BY MOUTH AT BEDTIME AS NEEDED SLEEP 30 tablet 1   No current facility-administered medications for this visit.     PHYSICAL EXAMINATION: ECOG PERFORMANCE STATUS: 0 - Asymptomatic  Vitals:   08/26/24 1345  BP: (!) 155/50  Pulse: 88  Resp: 17  Temp: 97.9 F (36.6 C)  SpO2: 100%      Filed Weights   08/26/24 1345  Weight: 163 lb 6.4 oz (74.1 kg)    GENERAL:alert, no distress and comfortable Bilateral breasts inspected. Post op changes noted. No palpable masses. No regional adenopathy. Chest: Clear to auscultation bilaterally Heart: Rate and rhythm regular Abdomen: Soft, nontender, nondistended No lower extremity edema  LABORATORY  DATA:  I have reviewed the data as listed Lab Results  Component Value Date   WBC 4.9 06/02/2024   HGB 11.9 (L) 06/02/2024   HCT 34.7 (L) 06/02/2024   MCV 89.9 06/02/2024   PLT 230 06/02/2024   Lab Results  Component Value Date   NA 135 08/06/2024   K 5.8 (H) 08/06/2024   CL 101  08/06/2024   CO2 20 08/06/2024    RADIOGRAPHIC STUDIES: I have personally reviewed the radiological reports and agreed with the findings in the report.  ASSESSMENT AND PLAN:   Assessment and Plan Assessment & Plan  Breast cancer on adjuvant therapy with aromatase inhibitor and trastuzumab  Breast cancer managed with letrozole . Tolerating trastuzumab  well. Echocardiogram scheduled for cardiac monitoring. - Continue letrozole  every other day. - Order echocardiogram in mid-October.  Arthralgia due to aromatase inhibitor Arthralgia improved with adjusted letrozole  dosing. - Continue letrozole  every other day to manage arthralgia.  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 Prescribed Ambien  for sleep aid. - Use Ambien  as needed for sleep disturbances.  Lymphedema of right upper extremity Lymphedema with scar tissue present. Compression garment used for management. - Continue wearing compression garment for lymphedema management.  Time spent: 20 min All questions were answered. The patient knows to call the clinic with any problems, questions or concerns.    Amber Stalls, MD 08/26/24

## 2024-08-27 ENCOUNTER — Ambulatory Visit (INDEPENDENT_AMBULATORY_CARE_PROVIDER_SITE_OTHER): Admitting: Otolaryngology

## 2024-08-27 ENCOUNTER — Encounter (INDEPENDENT_AMBULATORY_CARE_PROVIDER_SITE_OTHER): Payer: Self-pay | Admitting: Otolaryngology

## 2024-08-27 VITALS — BP 161/79 | HR 74 | Temp 98.2°F

## 2024-08-27 DIAGNOSIS — R0981 Nasal congestion: Secondary | ICD-10-CM

## 2024-08-27 DIAGNOSIS — H8111 Benign paroxysmal vertigo, right ear: Secondary | ICD-10-CM | POA: Diagnosis not present

## 2024-08-27 DIAGNOSIS — J343 Hypertrophy of nasal turbinates: Secondary | ICD-10-CM | POA: Diagnosis not present

## 2024-08-27 DIAGNOSIS — J310 Chronic rhinitis: Secondary | ICD-10-CM

## 2024-08-27 DIAGNOSIS — R42 Dizziness and giddiness: Secondary | ICD-10-CM

## 2024-08-27 DIAGNOSIS — J338 Other polyp of sinus: Secondary | ICD-10-CM

## 2024-08-27 NOTE — Progress Notes (Unsigned)
 Patient ID: Misty Wilkerson, female   DOB: 03/27/1941, 83 y.o.   MRN: [id number]  Follow-up: Chronic left maxillary sinusitis and polyposis  HPI:  The patient is an 83 year old female who returns today for her follow-up evaluation. The patient previously underwent left endoscopic sinus surgery in January 2023 to treat her chronic maxillary sinusitis and polyposis.  She had a large mucous retention cyst within the left maxillary sinus.  The patient returns today reporting no significant difficulty since her last visit.  She has occasional nasal congestion and drainage.  She was recently diagnosed with recurrent breast cancer.  She currently denies any facial pain, fever, or visual change.  She is able to breathe through both nostrils.  Exam: General: Communicates without difficulty, well nourished, no acute distress. Head: Normocephalic, no evidence injury, no tenderness, facial buttresses intact without stepoff. Face/sinus: No tenderness to palpation and percussion. Facial movement is normal and symmetric. Eyes: PERRL, EOMI. No scleral icterus, conjunctivae clear. Neuro: CN II exam reveals vision grossly intact.  No nystagmus at any point of gaze. Ears: Auricles well formed without lesions.  Ear canals are intact without mass or lesion.  No erythema or edema is appreciated.  The TMs are intact without fluid. Nose: External evaluation reveals normal support and skin without lesions.  Dorsum is intact.  Anterior rhinoscopy reveals congested mucosa over anterior aspect of inferior turbinates and intact septum.  No purulence noted. Oral:  Oral cavity and oropharynx are intact, symmetric, without erythema or edema.  Mucosa is moist without lesions. Neck: Full range of motion without pain.  There is no significant lymphadenopathy.  No masses palpable.  Thyroid bed within normal limits to palpation.  Parotid glands and submandibular glands equal bilaterally without mass.  Trachea is midline. Neuro:  CN 2-12  grossly intact.   Assessment: 1.  Chronic rhinitis with nasal mucosal congestion and bilateral turbinate hypertrophy. 2.  Her left maxillary antrum is patent.  No recurrent polyposis or infection is noted. 3.  Chronic postnasal drainage.  Plan: 1.  The physical exam findings are reviewed with the patient. 2.  The patient is reassured that no recurrent polyposis or infection is noted. 3.  Nasal saline irrigation as needed.   4.  The patient will return for reevaluation in 6 months.

## 2024-08-28 DIAGNOSIS — R42 Dizziness and giddiness: Secondary | ICD-10-CM | POA: Insufficient documentation

## 2024-08-28 DIAGNOSIS — J338 Other polyp of sinus: Secondary | ICD-10-CM | POA: Insufficient documentation

## 2024-08-28 DIAGNOSIS — H8111 Benign paroxysmal vertigo, right ear: Secondary | ICD-10-CM | POA: Insufficient documentation

## 2024-09-01 ENCOUNTER — Encounter: Payer: Self-pay | Admitting: Hematology and Oncology

## 2024-09-15 ENCOUNTER — Inpatient Hospital Stay

## 2024-09-15 ENCOUNTER — Encounter: Payer: Self-pay | Admitting: Hematology and Oncology

## 2024-09-15 VITALS — BP 165/61 | HR 77 | Temp 97.7°F | Resp 18 | Wt 161.2 lb

## 2024-09-15 DIAGNOSIS — Z5112 Encounter for antineoplastic immunotherapy: Secondary | ICD-10-CM | POA: Diagnosis not present

## 2024-09-15 DIAGNOSIS — Z170 Estrogen receptor positive status [ER+]: Secondary | ICD-10-CM

## 2024-09-15 MED ORDER — SODIUM CHLORIDE 0.9 % IV SOLN: INTRAVENOUS | Status: DC

## 2024-09-15 MED ORDER — TRASTUZUMAB-ANNS CHEMO 150 MG IV SOLR
6.0000 mg/kg | Freq: Once | INTRAVENOUS | Status: AC
  Administered 2024-09-15: 420 mg via INTRAVENOUS
  Filled 2024-09-15: qty 20

## 2024-09-15 NOTE — Patient Instructions (Signed)
 [facility] - A DEPT OF [facility]  Discharge Instructions: Thank you for choosing [facility] to provide your oncology and hematology care.   If you have a lab appointment with the Cancer Center, please go directly to the Cancer Center and check in at the registration area.   Wear comfortable clothing and clothing appropriate for easy access to any Portacath or PICC line.   We strive to give you quality time with your provider. You may need to reschedule your appointment if you arrive late (15 or more minutes).  Arriving late affects you and other patients whose appointments are after yours.  Also, if you miss three or more appointments without notifying the office, you may be dismissed from the clinic at the provider's discretion.      For prescription refill requests, have your pharmacy contact our office and allow 72 hours for refills to be completed.    Today you received the following chemotherapy and/or immunotherapy agents kanjinti      To help prevent nausea and vomiting after your treatment, we encourage you to take your nausea medication as directed.  BELOW ARE SYMPTOMS THAT SHOULD BE REPORTED IMMEDIATELY: *FEVER GREATER THAN 100.4 F (38 C) OR HIGHER *CHILLS OR SWEATING *NAUSEA AND VOMITING THAT IS NOT CONTROLLED WITH YOUR NAUSEA MEDICATION *UNUSUAL SHORTNESS OF BREATH *UNUSUAL BRUISING OR BLEEDING *URINARY PROBLEMS (pain or burning when urinating, or frequent urination) *BOWEL PROBLEMS (unusual diarrhea, constipation, pain near the anus) TENDERNESS IN MOUTH AND THROAT WITH OR WITHOUT PRESENCE OF ULCERS (sore throat, sores in mouth, or a toothache) UNUSUAL RASH, SWELLING OR PAIN  UNUSUAL VAGINAL DISCHARGE OR ITCHING   Items with * indicate a potential emergency and should be followed up as soon as possible or go to the Emergency Department if any problems should occur.  Please show the CHEMOTHERAPY ALERT CARD or IMMUNOTHERAPY  ALERT CARD at check-in to the Emergency Department and triage nurse.  Should you have questions after your visit or need to cancel or reschedule your appointment, please contact [facility] - A DEPT OF [facility]  Dept: [phone]  and follow the prompts.  Office hours are 8:00 a.m. to 4:30 p.m. Monday - Friday. Please note that voicemails left after 4:00 p.m. may not be returned until the following business day.  We are closed weekends and major holidays. You have access to a nurse at all times for urgent questions. Please call the main number to the clinic Dept: [phone] and follow the prompts.   For any non-urgent questions, you may also contact your provider using MyChart. We now offer e-Visits for anyone 81 and older to request care online for non-urgent symptoms. For details visit [email or web address].   Also download the MyChart app! Go to the app store, search "MyChart", open the app, select [facility], and log in with your MyChart username and password.

## 2024-09-30 ENCOUNTER — Other Ambulatory Visit: Payer: Self-pay | Admitting: Hematology and Oncology

## 2024-09-30 DIAGNOSIS — C50412 Malignant neoplasm of upper-outer quadrant of left female breast: Secondary | ICD-10-CM

## 2024-10-06 ENCOUNTER — Inpatient Hospital Stay: Attending: Hematology and Oncology

## 2024-10-06 ENCOUNTER — Inpatient Hospital Stay: Admitting: Hematology and Oncology

## 2024-10-06 ENCOUNTER — Encounter: Payer: Self-pay | Admitting: Hematology and Oncology

## 2024-10-06 VITALS — BP 142/78 | HR 79 | Temp 98.1°F | Resp 16 | Wt 162.4 lb

## 2024-10-06 DIAGNOSIS — Z170 Estrogen receptor positive status [ER+]: Secondary | ICD-10-CM | POA: Insufficient documentation

## 2024-10-06 DIAGNOSIS — C50412 Malignant neoplasm of upper-outer quadrant of left female breast: Secondary | ICD-10-CM | POA: Insufficient documentation

## 2024-10-06 DIAGNOSIS — G4700 Insomnia, unspecified: Secondary | ICD-10-CM | POA: Insufficient documentation

## 2024-10-06 DIAGNOSIS — Z5112 Encounter for antineoplastic immunotherapy: Secondary | ICD-10-CM | POA: Diagnosis not present

## 2024-10-06 DIAGNOSIS — I10 Essential (primary) hypertension: Secondary | ICD-10-CM | POA: Diagnosis not present

## 2024-10-06 DIAGNOSIS — Z79811 Long term (current) use of aromatase inhibitors: Secondary | ICD-10-CM | POA: Insufficient documentation

## 2024-10-06 MED ORDER — TRASTUZUMAB-ANNS CHEMO 150 MG IV SOLR
6.0000 mg/kg | Freq: Once | INTRAVENOUS | Status: AC
  Administered 2024-10-06: 420 mg via INTRAVENOUS
  Filled 2024-10-06: qty 20

## 2024-10-06 MED ORDER — SODIUM CHLORIDE 0.9 % IV SOLN: INTRAVENOUS | Status: DC

## 2024-10-06 NOTE — Patient Instructions (Signed)

## 2024-10-06 NOTE — Progress Notes (Signed)
 [facility] CONSULT NOTE  Patient Care Team: Zollie Lowers, MD as PCP - General (Family Medicine) Tyree Nanetta SAILOR, RN as Oncology Nurse Navigator Loretha Ash, MD as Consulting Physician (Hematology and Oncology)  CHIEF COMPLAINTS/PURPOSE OF CONSULTATION:  Newly diagnosed breast cancer  HISTORY OF PRESENTING ILLNESS:  Misty Wilkerson 83 y.o. female is here because of recent diagnosis of left breast cancer  I reviewed her records extensively and collaborated the history with the patient.  SUMMARY OF ONCOLOGIC HISTORY: Oncology History  Breast cancer of upper-inner quadrant of left female breast (HCC)  12/08/1998 Initial Biopsy   Left breast cancer: Lumpectomy followed by radiation T1 BN 0M0 could not tolerate tamoxifen or alternative therapies.   08/12/2013 Surgery   Left breast lumpectomy: Tumor 1  invasive ductal carcinoma 0.4 cm with DCIS, tumor to: IDC 1.2 cm with DCIS positive margin, ER 98%, PR 14%, HER-2 negative, Ki-67 19% margins reexcision negative   09/07/2013 -  Anti-estrogen oral therapy   Letrozole  2.5 mg daily plans for 5 years   Breast cancer of upper-outer quadrant of left female breast (HCC)  09/10/2023 Initial Diagnosis   Breast cancer of upper-outer quadrant of left female breast (HCC)   09/10/2023 Cancer Staging   Staging form: Breast, AJCC 8th Edition - Clinical: Stage IA (cT1, cN0, cM0, G3, ER+, PR+, HER2+) - Signed by Loretha Ash, MD on 09/10/2023 Stage prefix: Initial diagnosis Histologic grading system: 3 grade system   11/25/2023 -  Chemotherapy   Patient is on Treatment Plan : BREAST Trastuzumab  IV (8/6) or SQ (600) D1 q21d     11/26/2023 - 11/26/2023 Chemotherapy   Patient is on Treatment Plan : BREAST Paclitaxel + Trastuzumab  q7d / Trastuzumab  q21d      The patient, an 84 year old woman with a history of breast cancer, presents with a new breast mass. This is her third occurrence of breast cancer, with previous  diagnoses in 2000 and 2014.  Since her last visit here she had left breast lumpectomy, final pathology showed invasive ductal carcinoma measuring 1 x 0.8 x 0.5 cm, grade 2, DCIS focal solid type grade 2, negative margins for both invasive and DCIS.  All margins are negative for malignancy.  No lymph node specimen was submitted.  Prognostics from previous breast biopsy showed ER +100% strong PR +100% strong and HER2 positive by FISH with Ki-67 of 30%.   Discussed the use of AI scribe software for clinical note transcription with the patient, who gave verbal consent to proceed. History of Present Illness  Misty Wilkerson is an 83 year old female with breast cancer who presents for follow-up regarding her treatment.  She is currently undergoing treatment for breast cancer and is nearing the end of her current regimen, with completion scheduled for December 23rd. She has been taking letrozole  every other day as part of her treatment plan.  Her primary symptom at this time is fatigue. She reports fatigue as her main symptom during the review of systems.  She recalls being informed about the need for an echocardiogram to monitor her heart function due to the use of Herceptin , but she has not yet scheduled this test.  Rest of the pertinent 10 point ROS reviewed and neg.   MEDICAL HISTORY:  Past Medical History:  Diagnosis Date   Allergy    Arthritis    Cancer (HCC)    left breast   H/O hiatal hernia    eats slowly  now   Hypertension    Personal  history of radiation therapy 2002   left breast   Vertigo     SURGICAL HISTORY: Past Surgical History:  Procedure Laterality Date   BREAST BIOPSY Left 08/29/2023   US  LT BREAST BX W LOC DEV 1ST LESION IMG BX SPEC US  GUIDE 08/29/2023 [facility]   BREAST BIOPSY Left 09/27/2023   US  LT RADIOACTIVE SEED LOC 09/27/2023 [facility]   BREAST LUMPECTOMY Left 2002, 2014   BREAST LUMPECTOMY WITH RADIOACTIVE SEED LOCALIZATION Left 09/30/2023    Procedure: LEFT BREAST SEED GUIDED LUMPECTOMY;  Surgeon: Ebbie Cough, MD;  Location: [facility];  Service: General;  Laterality: Left;  LMA   CHOLECYSTECTOMY     COLONOSCOPY N/A 10/26/2015   Procedure: COLONOSCOPY;  Surgeon: Claudis RAYMOND Rivet, MD;  Location: [facility];  Service: Endoscopy;  Laterality: N/A;  1200   DEBRIDEMENT AND CLOSURE WOUND Left 03/25/2018   Procedure: DEBRIDEMENTINTERPHALANGEAL LEFT INDEX FINGER;  Surgeon: Murrell Kuba, MD;  Location: [facility];  Service: Orthopedics;  Laterality: Left;   EXCISION MASS UPPER EXTREMETIES Left 03/25/2018   Procedure: EXCISION MASS LEFT INDEX;  Surgeon: Murrell Kuba, MD;  Location: [facility];  Service: Orthopedics;  Laterality: Left;   EYE SURGERY     bilateral cataracts   MAXILLARY ANTROSTOMY Left 12/25/2021   Procedure: LEFT MAXILLARY ANTROSTOMY WITH TISSUE REMOVAL;  Surgeon: Karis Clunes, MD;  Location: [facility];  Service: ENT;  Laterality: Left;   PARTIAL MASTECTOMY WITH NEEDLE LOCALIZATION AND AXILLARY SENTINEL LYMPH NODE BX Left 08/12/2013   Procedure: LEFT PARTIAL MASTECTOMY WITH NEEDLE LOCALIZATION ATTEMPTED AXILLARY SENTINEL LYMPH NODE BIOPSY;  Surgeon: Cough Ebbie, MD;  Location: [facility];  Service: General;  Laterality: Left;   RE-EXCISION OF BREAST LUMPECTOMY Left 08/27/2013   Procedure: RE-EXCISION OF BREAST LUMPECTOMY;  Surgeon: Cough Ebbie, MD;  Location: [facility];  Service: General;  Laterality: Left;    SOCIAL HISTORY: Social History   Socioeconomic History   Marital status: Married    Spouse name: Garrel   Number of children: 2   Years of education: Not on file   Highest education level: Bachelor's degree (e.g., BA, AB, BS)  Occupational History   Occupation: retired  Tobacco Use   Smoking status: Former    Current packs/day: 0.00    Average packs/day: 0.5 packs/day for 15.0 years (7.5 ttl pk-yrs)    Types: Cigarettes    Start date: 07/21/1984     Quit date: 07/22/1999    Years since quitting: 25.2   Smokeless tobacco: Never  Vaping Use   Vaping status: Never Used  Substance and Sexual Activity   Alcohol use: Yes    Comment: one alcohol drink daily   Drug use: No   Sexual activity: Yes  Other Topics Concern   Not on file  Social History Narrative   Very independent and pretty active   Social Drivers of Corporate Investment Banker Strain: Low Risk  (07/30/2024)   Overall Financial Resource Strain (CARDIA)    Difficulty of Paying Living Expenses: Not hard at all  Food Insecurity: No Food Insecurity (07/30/2024)   Hunger Vital Sign    Worried About Running Out of Food in the Last Year: Never true    Ran Out of Food in the Last Year: Never true  Transportation Needs: No Transportation Needs (07/30/2024)   PRAPARE - Administrator, Civil Service (Medical): No    Lack of Transportation (Non-Medical): No  Physical Activity: Insufficiently Active (  07/30/2024)   Exercise Vital Sign    Days of Exercise per Week: 3 days    Minutes of Exercise per Session: 40 min  Stress: No Stress Concern Present (07/30/2024)   [employer] of Occupational Health - Occupational Stress Questionnaire    Feeling of Stress: Only a little  Social Connections: Socially Integrated (07/30/2024)   Social Connection and Isolation Panel    Frequency of Communication with Friends and Family: More than three times a week    Frequency of Social Gatherings with Friends and Family: Twice a week    Attends Religious Services: More than 4 times per year    Active Member of [employer] or Organizations: Yes    Attends Banker Meetings: 1 to 4 times per year    Marital Status: Married  Catering Manager Violence: Not At Risk (12/11/2023)   Humiliation, Afraid, Rape, and Kick questionnaire    Fear of Current or Ex-Partner: No    Emotionally Abused: No    Physically Abused: No    Sexually Abused: No    FAMILY HISTORY: Family History  Problem  Relation Age of Onset   Hypertension Mother    Diabetes Father    Stroke Father    Breast cancer Sister 54   Breast cancer Sister    Aneurysm Sister    Cancer Brother    Heart disease Brother     ALLERGIES:  is allergic to penicillins and tetracyclines & related.  MEDICATIONS:  Current Outpatient Medications  Medication Sig Dispense Refill   cholecalciferol (VITAMIN D3) 25 MCG (1000 UNIT) tablet Take 2 tablets (2,000 Units total) by mouth daily.     letrozole  (FEMARA ) 2.5 MG tablet Take 1 tablet (2.5 mg total) by mouth daily. 90 tablet 3   Multiple Vitamin (MULTIVITAMIN) tablet Take 1 tablet by mouth daily.     triamterene -hydrochlorothiazide (MAXZIDE-25) 37.5-25 MG tablet TAKE 1/2 TABLET BY MOUTH DAILY 45 tablet 3   valsartan  (DIOVAN ) 160 MG tablet TAKE 1 TABLET BY MOUTH ONCE DAILY FOR blood pressure. 30 tablet 2   vitamin B-12 (CYANOCOBALAMIN) 1000 MCG tablet Take 1,000 mcg by mouth daily.     zolpidem  (AMBIEN ) 5 MG tablet TAKE 1 TABLET BY MOUTH AT BEDTIME AS NEEDED SLEEP 30 tablet 1   No current facility-administered medications for this visit.     PHYSICAL EXAMINATION: ECOG PERFORMANCE STATUS: 0 - Asymptomatic  Vitals:   10/06/24 1341  BP: (!) 182/66  Pulse: 79  Resp: 16  Temp: 98.1 F (36.7 C)  SpO2: 99%      Filed Weights   10/06/24 1341  Weight: 162 lb 6.4 oz (73.7 kg)    GENERAL:alert, no distress and comfortable Chest: Clear to auscultation bilaterally Heart: Rate and rhythm regular Abdomen: Soft, nontender, nondistended No lower extremity edema  LABORATORY DATA:  I have reviewed the data as listed Lab Results  Component Value Date   WBC 4.9 06/02/2024   HGB 11.9 (L) 06/02/2024   HCT 34.7 (L) 06/02/2024   MCV 89.9 06/02/2024   PLT 230 06/02/2024   Lab Results  Component Value Date   NA 135 08/06/2024   K 5.8 (H) 08/06/2024   CL 101 08/06/2024   CO2 20 08/06/2024    RADIOGRAPHIC STUDIES: I have personally reviewed the radiological  reports and agreed with the findings in the report.  ASSESSMENT AND PLAN:   Assessment and Plan Assessment & Plan  Breast cancer on adjuvant therapy with aromatase inhibitor and trastuzumab  Tolerating trastuzumab  well. Echocardiogram  to be scheduled for cardiac monitoring. - She has no symptoms for cardiac compromise. Ok to proceed with herceptin  today - Continue letrozole  every other day. - Order echocardiogram in mid-October.  Arthralgia due to aromatase inhibitor Arthralgia improved with adjusted letrozole  dosing. - Continue letrozole  every other day to manage arthralgia.  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 Prescribed Ambien  for sleep aid. - Use Ambien  as needed for sleep disturbances.  Hypertension Likely because of anxiety, she said she had to hurry up for the appointment.  RTC as scheduled.  Time spent: 30 min All questions were answered. The patient knows to call the clinic with any problems, questions or concerns.    Amber Stalls, MD 10/06/24

## 2024-10-07 ENCOUNTER — Encounter: Payer: Self-pay | Admitting: Hematology and Oncology

## 2024-10-08 ENCOUNTER — Ambulatory Visit (INDEPENDENT_AMBULATORY_CARE_PROVIDER_SITE_OTHER): Admitting: Otolaryngology

## 2024-10-08 ENCOUNTER — Other Ambulatory Visit: Payer: Self-pay | Admitting: *Deleted

## 2024-10-08 VITALS — BP 155/73 | HR 65 | Temp 97.8°F | Ht 61.5 in | Wt 162.0 lb

## 2024-10-08 DIAGNOSIS — J320 Chronic maxillary sinusitis: Secondary | ICD-10-CM | POA: Diagnosis not present

## 2024-10-08 DIAGNOSIS — J310 Chronic rhinitis: Secondary | ICD-10-CM

## 2024-10-08 DIAGNOSIS — J338 Other polyp of sinus: Secondary | ICD-10-CM | POA: Diagnosis not present

## 2024-10-08 DIAGNOSIS — H8111 Benign paroxysmal vertigo, right ear: Secondary | ICD-10-CM | POA: Diagnosis not present

## 2024-10-08 DIAGNOSIS — Z5181 Encounter for therapeutic drug level monitoring: Secondary | ICD-10-CM

## 2024-10-08 NOTE — Progress Notes (Signed)
 Patient ID: Misty Wilkerson, female   DOB: September 23, 1941, 83 y.o.   MRN: [id number]  Follow up: Recurrent dizziness, chronic left maxillary sinusitis and polyposis  Discussed the use of AI scribe software for clinical note transcription with the patient, who gave verbal consent to proceed.  History of Present Illness Misty Wilkerson is an 83 year old female who presents for follow-up of benign paroxysmal positional vertigo (BPPV).  She has experienced no further episodes of dizziness or spinning since the last visit, which she was successfully treated with the right Epley maneuver. She is relieved to be able to turn over without experiencing spinning sensations.  No sinus trouble or significant issues with pollen, although she suspects dust in her house may cause some irritation. She is not keen on cleaning due to her age and other priorities. The patient has a history of chronic sinusitis and polyposis. She underwent left endoscopic sinus surgery in January 2023.    Exam: General: Communicates without difficulty, well nourished, no acute distress. Head: Normocephalic, no evidence injury, no tenderness, facial buttresses intact without stepoff. Face/sinus: No tenderness to palpation and percussion. Facial movement is normal and symmetric. Eyes: PERRL, EOMI. No scleral icterus, conjunctivae clear. Neuro: CN II exam reveals vision grossly intact.  No nystagmus at any point of gaze. Ears: Auricles well formed without lesions.  Ear canals are intact without mass or lesion.  No erythema or edema is appreciated.  The TMs are intact without fluid. Nose: External evaluation reveals normal support and skin without lesions.  Dorsum is intact.  Anterior rhinoscopy reveals congested mucosa over anterior aspect of inferior turbinates and intact septum.  No purulence noted. Oral:  Oral cavity and oropharynx are intact, symmetric, without erythema or edema.  Mucosa is moist without lesions. Neck: Full range of  motion without pain.  There is no significant lymphadenopathy.  No masses palpable.  Thyroid bed within normal limits to palpation.  Parotid glands and submandibular glands equal bilaterally without mass.  Trachea is midline. Neuro:  CN 2-12 grossly intact.    Assessment and Plan Assessment & Plan Chronic left maxillary sinusitis with nasal polyposis Sinuses appear fine with minimal congestion. No current sinus trouble reported. - Nasal saline irrigation as needed. - Scheduled follow-up in six months to monitor sinus condition and prevent polyp recurrence.  Right benign paroxysmal positional vertigo (BPPV) No recent episodes of dizziness or spinning. Previous maneuver effectively resolved BPPV. Discussed potential for recurrence, which can occur anytime, sometimes months or years after resolution. - Advised to call if symptoms recur.

## 2024-10-09 ENCOUNTER — Other Ambulatory Visit: Payer: Self-pay

## 2024-10-19 ENCOUNTER — Encounter: Payer: Self-pay | Admitting: Hematology and Oncology

## 2024-10-23 ENCOUNTER — Ambulatory Visit (HOSPITAL_COMMUNITY)
Admission: RE | Admit: 2024-10-23 | Discharge: 2024-10-23 | Disposition: A | Discharge status: Home / Self Care | Source: Ambulatory Visit | Attending: Hematology and Oncology | Admitting: Hematology and Oncology

## 2024-10-23 DIAGNOSIS — Z79899 Other long term (current) drug therapy: Secondary | ICD-10-CM | POA: Insufficient documentation

## 2024-10-23 DIAGNOSIS — Z5181 Encounter for therapeutic drug level monitoring: Secondary | ICD-10-CM | POA: Diagnosis not present

## 2024-10-23 DIAGNOSIS — I3481 Nonrheumatic mitral (valve) annulus calcification: Secondary | ICD-10-CM | POA: Insufficient documentation

## 2024-10-23 LAB — ECHOCARDIOGRAM COMPLETE
AV Mean grad: 3 mmHg
AV Peak grad: 4.8 mmHg
Ao pk vel: 1.09 m/s
Area-P 1/2: 3.28 cm2
Calc EF: 74.7 %
S' Lateral: 2.5 cm
Single Plane A2C EF: 75.6 %
Single Plane A4C EF: 73.1 %

## 2024-10-23 NOTE — Progress Notes (Signed)
  Echocardiogram 2D Echocardiogram has been performed.  Norleen ORN [facility] 10/23/2024, 11:41 AM

## 2024-10-26 ENCOUNTER — Other Ambulatory Visit: Payer: Self-pay | Admitting: Hematology and Oncology

## 2024-10-27 ENCOUNTER — Inpatient Hospital Stay: Attending: Hematology and Oncology

## 2024-10-27 ENCOUNTER — Encounter: Payer: Self-pay | Admitting: Hematology and Oncology

## 2024-10-27 VITALS — BP 126/43 | HR 71 | Temp 98.5°F | Resp 14 | Wt 163.8 lb

## 2024-10-27 DIAGNOSIS — C50412 Malignant neoplasm of upper-outer quadrant of left female breast: Secondary | ICD-10-CM | POA: Insufficient documentation

## 2024-10-27 DIAGNOSIS — Z5112 Encounter for antineoplastic immunotherapy: Secondary | ICD-10-CM | POA: Insufficient documentation

## 2024-10-27 DIAGNOSIS — Z170 Estrogen receptor positive status [ER+]: Secondary | ICD-10-CM

## 2024-10-27 MED ORDER — TRASTUZUMAB-ANNS CHEMO 150 MG IV SOLR
6.0000 mg/kg | Freq: Once | INTRAVENOUS | Status: AC
  Administered 2024-10-27: 420 mg via INTRAVENOUS
  Filled 2024-10-27: qty 20

## 2024-10-27 MED ORDER — SODIUM CHLORIDE 0.9 % IV SOLN: INTRAVENOUS | Status: DC

## 2024-10-27 NOTE — Patient Instructions (Signed)

## 2024-10-28 ENCOUNTER — Other Ambulatory Visit: Payer: Self-pay | Admitting: *Deleted

## 2024-10-28 ENCOUNTER — Ambulatory Visit: Payer: Self-pay | Admitting: Hematology and Oncology

## 2024-10-28 DIAGNOSIS — C50412 Malignant neoplasm of upper-outer quadrant of left female breast: Secondary | ICD-10-CM

## 2024-10-28 DIAGNOSIS — R918 Other nonspecific abnormal finding of lung field: Secondary | ICD-10-CM

## 2024-10-28 NOTE — Telephone Encounter (Signed)
 Called to make pt aware that ECHO was normal and will f/u in 6 months. Pt verbalized understanding

## 2024-11-15 ENCOUNTER — Other Ambulatory Visit: Payer: Self-pay | Admitting: Family Medicine

## 2024-11-16 ENCOUNTER — Other Ambulatory Visit: Payer: Self-pay

## 2024-11-16 DIAGNOSIS — Z170 Estrogen receptor positive status [ER+]: Secondary | ICD-10-CM

## 2024-11-17 ENCOUNTER — Inpatient Hospital Stay

## 2024-11-17 VITALS — BP 158/57 | HR 74 | Temp 97.6°F | Resp 17 | Wt 162.2 lb

## 2024-11-17 DIAGNOSIS — Z5112 Encounter for antineoplastic immunotherapy: Secondary | ICD-10-CM | POA: Diagnosis not present

## 2024-11-17 DIAGNOSIS — Z170 Estrogen receptor positive status [ER+]: Secondary | ICD-10-CM

## 2024-11-17 LAB — CBC WITH DIFFERENTIAL (CANCER CENTER ONLY)
Abs Immature Granulocytes: 0.02 K/uL (ref 0.00–0.07)
Basophils Absolute: 0 K/uL (ref 0.0–0.1)
Basophils Relative: 1 %
Eosinophils Absolute: 0.3 K/uL (ref 0.0–0.5)
Eosinophils Relative: 6 %
HCT: 38.4 % (ref 36.0–46.0)
Hemoglobin: 12.6 g/dL (ref 12.0–15.0)
Immature Granulocytes: 1 %
Lymphocytes Relative: 21 %
Lymphs Abs: 0.9 K/uL (ref 0.7–4.0)
MCH: 30.4 pg (ref 26.0–34.0)
MCHC: 32.8 g/dL (ref 30.0–36.0)
MCV: 92.5 fL (ref 80.0–100.0)
Monocytes Absolute: 0.4 K/uL (ref 0.1–1.0)
Monocytes Relative: 8 %
Neutro Abs: 2.8 K/uL (ref 1.7–7.7)
Neutrophils Relative %: 63 %
Platelet Count: 240 K/uL (ref 150–400)
RBC: 4.15 MIL/uL (ref 3.87–5.11)
RDW: 12.5 % (ref 11.5–15.5)
WBC Count: 4.4 K/uL (ref 4.0–10.5)
nRBC: 0 % (ref 0.0–0.2)

## 2024-11-17 LAB — CMP (CANCER CENTER ONLY)
ALT: 19 U/L (ref 0–44)
AST: 28 U/L (ref 15–41)
Albumin: 4.7 g/dL (ref 3.5–5.0)
Alkaline Phosphatase: 69 U/L (ref 38–126)
Anion gap: 13 (ref 5–15)
BUN: 33 mg/dL — ABNORMAL HIGH (ref 8–23)
CO2: 21 mmol/L — ABNORMAL LOW (ref 22–32)
Calcium: 9.8 mg/dL (ref 8.9–10.3)
Chloride: 102 mmol/L (ref 98–111)
Creatinine: 1.62 mg/dL — ABNORMAL HIGH (ref 0.44–1.00)
GFR, Estimated: 31 mL/min — ABNORMAL LOW
Glucose, Bld: 97 mg/dL (ref 70–99)
Potassium: 4.9 mmol/L (ref 3.5–5.1)
Sodium: 136 mmol/L (ref 135–145)
Total Bilirubin: 0.4 mg/dL (ref 0.0–1.2)
Total Protein: 7.7 g/dL (ref 6.5–8.1)

## 2024-11-17 MED ORDER — SODIUM CHLORIDE 0.9 % IV SOLN: INTRAVENOUS | Status: DC

## 2024-11-17 MED ORDER — TRASTUZUMAB-ANNS CHEMO 150 MG IV SOLR
6.0000 mg/kg | Freq: Once | INTRAVENOUS | Status: AC
  Administered 2024-11-17: 420 mg via INTRAVENOUS
  Filled 2024-11-17: qty 20

## 2024-11-17 NOTE — Patient Instructions (Signed)

## 2024-11-17 NOTE — Progress Notes (Signed)
 Pt stated she took tylenol  and benadryl  PTA.

## 2024-12-11 ENCOUNTER — Ambulatory Visit: Payer: Medicare PPO

## 2024-12-11 VITALS — BP 155/73 | HR 65 | Ht 65.0 in | Wt 162.0 lb

## 2024-12-11 DIAGNOSIS — Z0000 Encounter for general adult medical examination without abnormal findings: Secondary | ICD-10-CM

## 2024-12-11 NOTE — Progress Notes (Signed)
 "  Chief Complaint  Patient presents with   Medicare Wellness     Subjective:   Misty Wilkerson is a 84 y.o. female who presents for a Medicare Annual Wellness Visit.  Visit info / Clinical Intake: Medicare Wellness Visit Type:: Subsequent Annual Wellness Visit Persons participating in visit and providing information:: patient Medicare Wellness Visit Mode:: Telephone If telephone:: video declined Since this visit was completed virtually, some vitals may be partially provided or unavailable. Missing vitals are due to the limitations of the virtual format.: Unable to obtain vitals - no equipment If Telephone or Video please confirm:: I connected with patient using audio/video enable telemedicine. I verified patient identity with two identifiers, discussed telehealth limitations, and patient agreed to proceed. Patient Location:: home Provider Location:: office Interpreter Needed?: No Pre-visit prep was completed: yes AWV questionnaire completed by patient prior to visit?: yes Date:: 12/07/24 Living arrangements:: (Patient-Rptd) lives with spouse/significant other Patient's Overall Health Status Rating: (Patient-Rptd) good Typical amount of pain: (Patient-Rptd) some Does pain affect daily life?: (Patient-Rptd) no Are you currently prescribed opioids?: no  Dietary Habits and Nutritional Risks How many meals a day?: (Patient-Rptd) 2 Eats fruit and vegetables daily?: (Patient-Rptd) yes Most meals are obtained by: (Patient-Rptd) preparing own meals In the last 2 weeks, have you had any of the following?: none Diabetic:: no  Functional Status Activities of Daily Living (to include ambulation/medication): (Patient-Rptd) Independent Ambulation: (Patient-Rptd) Independent Medication Administration: (Patient-Rptd) Independent Home Management (perform basic housework or laundry): (Patient-Rptd) Independent Manage your own finances?: (Patient-Rptd) yes Primary transportation is:  (Patient-Rptd) driving Concerns about vision?: no *vision screening is required for [facility]* (last ov last 73yrs per pt) Concerns about hearing?: no  Fall Screening Falls in the past year?: (Patient-Rptd) 0 Number of falls in past year: 0 Was there an injury with Fall?: 0 Fall Risk Category Calculator: 0 Patient Fall Risk Level: Low Fall Risk  Fall Risk Patient at Risk for Falls Due to: No Fall Risks Fall risk Follow up: Falls evaluation completed; Education provided  Home and Transportation Safety: All rugs have non-skid backing?: (Patient-Rptd) yes All stairs or steps have railings?: (Patient-Rptd) yes Grab bars in the bathtub or shower?: (Patient-Rptd) yes Have non-skid surface in bathtub or shower?: (Patient-Rptd) yes Good home lighting?: (Patient-Rptd) yes Regular seat belt use?: (Patient-Rptd) yes Hospital stays in the last year:: (Patient-Rptd) no  Cognitive Assessment Difficulty concentrating, remembering, or making decisions? : (Patient-Rptd) no Will 6CIT or Mini Cog be Completed: yes What year is it?: 0 points What month is it?: 0 points Give patient an address phrase to remember (5 components): [address] [place] what time is it?: 0 points Count backwards from 20 to 1: 0 points Say the months of the year in reverse: 0 points Repeat the address phrase from earlier: 0 points 6 CIT Score: 0 points  Advance Directives (For Healthcare) Does Patient Have a Medical Advance Directive?: No Would patient like information on creating a medical advance directive?: No - Patient declined  Reviewed/Updated  Reviewed/Updated: Reviewed All (Medical, Surgical, Family, Medications, Allergies, Care Teams, Patient Goals); Medical History; Surgical History; Family History; Medications; Allergies; Care Teams; Patient Goals    Allergies (verified) Penicillins and Tetracyclines & related   Current Medications (verified) Outpatient Encounter Medications as of 12/11/2024   Medication Sig   cholecalciferol (VITAMIN D3) 25 MCG (1000 UNIT) tablet Take 2 tablets (2,000 Units total) by mouth daily.   letrozole  (FEMARA ) 2.5 MG tablet Take 1 tablet (2.5 mg total) by mouth  daily.   Multiple Vitamin (MULTIVITAMIN) tablet Take 1 tablet by mouth daily.   triamterene -hydrochlorothiazide (MAXZIDE-25) 37.5-25 MG tablet TAKE 1/2 TABLET BY MOUTH DAILY   valsartan  (DIOVAN ) 160 MG tablet TAKE 1 TABLET BY MOUTH ONCE DAILY FOR blood pressure.   vitamin B-12 (CYANOCOBALAMIN) 1000 MCG tablet Take 1,000 mcg by mouth daily.   zolpidem  (AMBIEN ) 5 MG tablet TAKE 1 TABLET BY MOUTH AT BEDTIME AS NEEDED SLEEP   No facility-administered encounter medications on file as of 12/11/2024.    History: Past Medical History:  Diagnosis Date   Allergy    Arthritis    Cancer (HCC)    left breast   H/O hiatal hernia    eats slowly  now   Hypertension    Personal history of radiation therapy 2002   left breast   Vertigo    Past Surgical History:  Procedure Laterality Date   BREAST BIOPSY Left 08/29/2023   US  LT BREAST BX W LOC DEV 1ST LESION IMG BX SPEC US  GUIDE 08/29/2023 [facility]   BREAST BIOPSY Left 09/27/2023   US  LT RADIOACTIVE SEED LOC 09/27/2023 [facility]   BREAST LUMPECTOMY Left 2002, 2014   BREAST LUMPECTOMY WITH RADIOACTIVE SEED LOCALIZATION Left 09/30/2023   Procedure: LEFT BREAST SEED GUIDED LUMPECTOMY;  Surgeon: Ebbie Cough, MD;  Location: [facility];  Service: General;  Laterality: Left;  LMA   CHOLECYSTECTOMY     COLONOSCOPY N/A 10/26/2015   Procedure: COLONOSCOPY;  Surgeon: Claudis RAYMOND Rivet, MD;  Location: [facility];  Service: Endoscopy;  Laterality: N/A;  1200   DEBRIDEMENT AND CLOSURE WOUND Left 03/25/2018   Procedure: DEBRIDEMENTINTERPHALANGEAL LEFT INDEX FINGER;  Surgeon: Murrell Kuba, MD;  Location: [facility];  Service: Orthopedics;  Laterality: Left;   EXCISION MASS UPPER EXTREMETIES Left 03/25/2018   Procedure:  EXCISION MASS LEFT INDEX;  Surgeon: Murrell Kuba, MD;  Location: [facility];  Service: Orthopedics;  Laterality: Left;   EYE SURGERY     bilateral cataracts   MAXILLARY ANTROSTOMY Left 12/25/2021   Procedure: LEFT MAXILLARY ANTROSTOMY WITH TISSUE REMOVAL;  Surgeon: Karis Clunes, MD;  Location: [facility];  Service: ENT;  Laterality: Left;   PARTIAL MASTECTOMY WITH NEEDLE LOCALIZATION AND AXILLARY SENTINEL LYMPH NODE BX Left 08/12/2013   Procedure: LEFT PARTIAL MASTECTOMY WITH NEEDLE LOCALIZATION ATTEMPTED AXILLARY SENTINEL LYMPH NODE BIOPSY;  Surgeon: Cough Ebbie, MD;  Location: [facility];  Service: General;  Laterality: Left;   RE-EXCISION OF BREAST LUMPECTOMY Left 08/27/2013   Procedure: RE-EXCISION OF BREAST LUMPECTOMY;  Surgeon: Cough Ebbie, MD;  Location: [facility];  Service: General;  Laterality: Left;   Family History  Problem Relation Age of Onset   Hypertension Mother    Diabetes Father    Stroke Father    Breast cancer Sister 41   Breast cancer Sister    Aneurysm Sister    Cancer Brother    Heart disease Brother    Social History   Occupational History   Occupation: retired  Tobacco Use   Smoking status: Former    Current packs/day: 0.00    Average packs/day: 0.5 packs/day for 15.0 years (7.5 ttl pk-yrs)    Types: Cigarettes    Start date: 07/21/1984    Quit date: 07/22/1999    Years since quitting: 25.4   Smokeless tobacco: Never  Vaping Use   Vaping status: Never Used  Substance and Sexual Activity   Alcohol use: Yes    Alcohol/week: 1.0 standard drink  of alcohol    Types: 1 Glasses of wine per week    Comment: one alcohol drink daily   Drug use: Never   Sexual activity: Not Currently   Tobacco Counseling Counseling given: Yes  SDOH Screenings   Food Insecurity: No Food Insecurity (12/07/2024)  Housing: Low Risk (12/07/2024)  Transportation Needs: No Transportation Needs (12/07/2024)  Utilities: Not At Risk (12/11/2024)  Alcohol  Screen: Low Risk (12/07/2024)  Depression (PHQ2-9): Low Risk (12/11/2024)  Financial Resource Strain: Low Risk (12/07/2024)  Physical Activity: Sufficiently Active (12/07/2024)  Social Connections: Socially Integrated (12/07/2024)  Stress: No Stress Concern Present (12/07/2024)  Tobacco Use: Medium Risk (12/11/2024)  Health Literacy: Adequate Health Literacy (12/11/2024)   See flowsheets for full screening details  Depression Screen PHQ 2 & 9 Depression Scale- Over the past 2 weeks, how often have you been bothered by any of the following problems? Little interest or pleasure in doing things: 0 Feeling down, depressed, or hopeless (PHQ Adolescent also includes...irritable): 0 PHQ-2 Total Score: 0 Trouble falling or staying asleep, or sleeping too much: 1 Feeling tired or having little energy: 1 Poor appetite or overeating (PHQ Adolescent also includes...weight loss): 0 Feeling bad about yourself - or that you are a failure or have let yourself or your family down: 0 Trouble concentrating on things, such as reading the newspaper or watching television (PHQ Adolescent also includes...like school work): 0 Moving or speaking so slowly that other people could have noticed. Or the opposite - being so fidgety or restless that you have been moving around a lot more than usual: 0 Thoughts that you would be better off dead, or of hurting yourself in some way: 0 PHQ-9 Total Score: 2 If you checked off any problems, how difficult have these problems made it for you to do your work, take care of things at home, or get along with other people?: Not difficult at all  Depression Treatment Depression Interventions/Treatment : [id number] Score <4 Follow-up Not Indicated     Goals Addressed             This Visit's Progress    Remain active and independent   On track            Objective:    Today's Vitals   12/11/24 0912  BP: (!) 155/73  Pulse: 65  Weight: 162 lb (73.5 kg)  Height: 5' 5 (1.651  m)   Body mass index is 26.96 kg/m.  Hearing/Vision screen No results found. Immunizations and Health Maintenance Health Maintenance  Topic Date Due   COVID-19 Vaccine (3 - Moderna risk series) 09/12/2021   Mammogram  08/07/2024   Medicare Annual Wellness (AWV)  12/11/2025   Bone Density Scan  02/04/2026   Pneumococcal Vaccine: 50+ Years  Completed   Influenza Vaccine  Completed   Zoster Vaccines- Shingrix  Completed   Meningococcal B Vaccine  Aged Out   DTaP/Tdap/Td  Discontinued   Hepatitis B Vaccines 19-59 Average Risk  Discontinued        Assessment/Plan:  This is a routine wellness examination for Misty Wilkerson.  Patient Care Team: Zollie Lowers, MD as PCP - General (Family Medicine) Tyree Nanetta SAILOR, RN as Oncology Nurse Navigator Loretha Ash, MD as Consulting Physician (Hematology and Oncology)  I have personally reviewed and noted the following in the patients chart:   Medical and social history Use of alcohol, tobacco or illicit drugs  Current medications and supplements including opioid prescriptions. Functional ability and status Nutritional status Physical activity  Advanced directives List of other physicians Hospitalizations, surgeries, and ER visits in previous 12 months Vitals Screenings to include cognitive, depression, and falls Referrals and appointments  No orders of the defined types were placed in this encounter.  In addition, I have reviewed and discussed with patient certain preventive protocols, quality metrics, and best practice recommendations. A written personalized care plan for preventive services as well as general preventive health recommendations were provided to patient.   Misty Wilkerson, CMA   12/11/2024   Return in 1 year (on 12/11/2025).  After Visit Summary: (MyChart) Due to this being a telephonic visit, the after visit summary with patients personalized plan was offered to patient via MyChart   Nurse Notes: Per pt is aware  and due the following: Covid-declined per pt, per pt just got done with her tx for breast cancer, will wait until care team decide on getting mammogram before future mammo apts per pt.  "

## 2024-12-11 NOTE — Patient Instructions (Signed)
 [name],  Thank you for taking the time for your Medicare Wellness Visit. I appreciate your continued commitment to your health goals. Please review the care plan we discussed, and feel free to reach out if I can assist you further.  Please note that Annual Wellness Visits do not include a physical exam. Some assessments may be limited, especially if the visit was conducted virtually. If needed, we may recommend an in-person follow-up with your provider.  Ongoing Care Seeing your primary care provider every 3 to 6 months helps us  monitor your health and provide consistent, personalized care.   Referrals If a referral was made during today's visit and you haven't received any updates within two weeks, please contact the referred provider directly to check on the status.  Recommended Screenings:  Health Maintenance  Topic Date Due   COVID-19 Vaccine (3 - Moderna risk series) 09/12/2021   Breast Cancer Screening  08/07/2024   Medicare Annual Wellness Visit  12/10/2024   Osteoporosis screening with Bone Density Scan  02/04/2026   Pneumococcal Vaccine for age over 33  Completed   Flu Shot  Completed   Zoster (Shingles) Vaccine  Completed   Meningitis B Vaccine  Aged Out   DTaP/Tdap/Td vaccine  Discontinued   Hepatitis B Vaccine  Discontinued       12/07/2024    5:04 PM  Advanced Directives  Does Patient Have a Medical Advance Directive? No  Would patient like information on creating a medical advance directive? No - Patient declined    Vision: Annual vision screenings are recommended for early detection of glaucoma, cataracts, and diabetic retinopathy. These exams can also reveal signs of chronic conditions such as diabetes and high blood pressure.  Dental: Annual dental screenings help detect early signs of oral cancer, gum disease, and other conditions linked to overall health, including heart disease and diabetes.  Please see the attached documents for additional preventive  care recommendations.

## 2024-12-15 ENCOUNTER — Other Ambulatory Visit: Payer: Self-pay

## 2024-12-17 ENCOUNTER — Other Ambulatory Visit: Payer: Self-pay | Admitting: Hematology and Oncology

## 2025-02-04 ENCOUNTER — Encounter: Payer: Self-pay | Admitting: Family Medicine

## 2025-04-08 ENCOUNTER — Ambulatory Visit (INDEPENDENT_AMBULATORY_CARE_PROVIDER_SITE_OTHER): Admitting: Otolaryngology

## 2025-12-13 ENCOUNTER — Ambulatory Visit
